# Patient Record
Sex: Female | Born: 1955 | Race: Black or African American | Hispanic: No | Marital: Single | Smoking: Never smoker
Health system: Southern US, Community
[De-identification: ages and names within clinical notes are randomized; demographics above are authoritative.]

## PROBLEM LIST (undated history)

## (undated) DIAGNOSIS — E079 Disorder of thyroid, unspecified: Secondary | ICD-10-CM

## (undated) DIAGNOSIS — M1990 Unspecified osteoarthritis, unspecified site: Secondary | ICD-10-CM

## (undated) DIAGNOSIS — M6282 Rhabdomyolysis: Secondary | ICD-10-CM

## (undated) DIAGNOSIS — D509 Iron deficiency anemia, unspecified: Secondary | ICD-10-CM

## (undated) DIAGNOSIS — G4730 Sleep apnea, unspecified: Secondary | ICD-10-CM

## (undated) HISTORY — PX: SHOULDER SURGERY: SHX246

## (undated) HISTORY — PX: DILATION AND CURETTAGE OF UTERUS: SHX78

## (undated) HISTORY — PX: GANGLION CYST EXCISION: SHX1691

## (undated) HISTORY — PX: ENDOMETRIAL ABLATION: SHX621

---

## 1999-05-11 ENCOUNTER — Other Ambulatory Visit: Admission: RE | Admit: 1999-05-11 | Discharge: 1999-05-11 | Payer: Self-pay | Admitting: Nephrology

## 1999-07-02 ENCOUNTER — Encounter: Payer: Self-pay | Admitting: Nephrology

## 1999-07-02 ENCOUNTER — Encounter: Admission: RE | Admit: 1999-07-02 | Discharge: 1999-07-02 | Payer: Self-pay | Admitting: Nephrology

## 2000-09-04 ENCOUNTER — Emergency Department (HOSPITAL_COMMUNITY): Admission: EM | Admit: 2000-09-04 | Discharge: 2000-09-04 | Payer: Self-pay | Admitting: *Deleted

## 2001-01-10 ENCOUNTER — Emergency Department (HOSPITAL_COMMUNITY): Admission: EM | Admit: 2001-01-10 | Discharge: 2001-01-10 | Payer: Self-pay | Admitting: Emergency Medicine

## 2001-01-10 ENCOUNTER — Encounter: Payer: Self-pay | Admitting: Emergency Medicine

## 2001-01-22 ENCOUNTER — Emergency Department (HOSPITAL_COMMUNITY): Admission: EM | Admit: 2001-01-22 | Discharge: 2001-01-23 | Payer: Self-pay | Admitting: Emergency Medicine

## 2001-07-12 ENCOUNTER — Emergency Department (HOSPITAL_COMMUNITY): Admission: EM | Admit: 2001-07-12 | Discharge: 2001-07-12 | Payer: Self-pay

## 2001-07-12 ENCOUNTER — Encounter: Payer: Self-pay | Admitting: Family Medicine

## 2002-01-27 ENCOUNTER — Emergency Department (HOSPITAL_COMMUNITY): Admission: EM | Admit: 2002-01-27 | Discharge: 2002-01-27 | Payer: Self-pay | Admitting: Emergency Medicine

## 2005-04-16 ENCOUNTER — Emergency Department (HOSPITAL_COMMUNITY): Admission: EM | Admit: 2005-04-16 | Discharge: 2005-04-16 | Payer: Self-pay | Admitting: Emergency Medicine

## 2005-06-10 ENCOUNTER — Emergency Department (HOSPITAL_COMMUNITY): Admission: EM | Admit: 2005-06-10 | Discharge: 2005-06-10 | Payer: Self-pay | Admitting: Family Medicine

## 2005-06-10 ENCOUNTER — Ambulatory Visit (HOSPITAL_COMMUNITY): Admission: RE | Admit: 2005-06-10 | Discharge: 2005-06-10 | Payer: Self-pay | Admitting: Family Medicine

## 2005-06-13 ENCOUNTER — Other Ambulatory Visit: Admission: RE | Admit: 2005-06-13 | Discharge: 2005-06-13 | Payer: Self-pay | Admitting: Obstetrics and Gynecology

## 2005-08-25 ENCOUNTER — Ambulatory Visit (HOSPITAL_COMMUNITY): Admission: RE | Admit: 2005-08-25 | Discharge: 2005-08-25 | Payer: Self-pay | Admitting: Obstetrics and Gynecology

## 2005-08-25 ENCOUNTER — Encounter (INDEPENDENT_AMBULATORY_CARE_PROVIDER_SITE_OTHER): Payer: Self-pay | Admitting: Specialist

## 2005-08-29 ENCOUNTER — Ambulatory Visit (HOSPITAL_COMMUNITY): Admission: RE | Admit: 2005-08-29 | Discharge: 2005-08-29 | Payer: Self-pay | Admitting: Obstetrics and Gynecology

## 2005-11-12 ENCOUNTER — Emergency Department (HOSPITAL_COMMUNITY): Admission: EM | Admit: 2005-11-12 | Discharge: 2005-11-12 | Payer: Self-pay | Admitting: Emergency Medicine

## 2006-01-10 ENCOUNTER — Emergency Department (HOSPITAL_COMMUNITY): Admission: EM | Admit: 2006-01-10 | Discharge: 2006-01-10 | Payer: Self-pay | Admitting: Family Medicine

## 2006-03-15 ENCOUNTER — Emergency Department (HOSPITAL_COMMUNITY): Admission: EM | Admit: 2006-03-15 | Discharge: 2006-03-15 | Payer: Self-pay | Admitting: Family Medicine

## 2006-03-21 ENCOUNTER — Ambulatory Visit: Payer: Self-pay | Admitting: Cardiovascular Disease

## 2006-06-19 ENCOUNTER — Other Ambulatory Visit: Admission: RE | Admit: 2006-06-19 | Discharge: 2006-06-19 | Payer: Self-pay | Admitting: Obstetrics and Gynecology

## 2007-02-11 ENCOUNTER — Emergency Department (HOSPITAL_COMMUNITY): Admission: EM | Admit: 2007-02-11 | Discharge: 2007-02-11 | Payer: Self-pay | Admitting: Family Medicine

## 2007-08-13 ENCOUNTER — Emergency Department (HOSPITAL_COMMUNITY): Admission: EM | Admit: 2007-08-13 | Discharge: 2007-08-13 | Payer: Self-pay | Admitting: Family Medicine

## 2008-02-07 ENCOUNTER — Ambulatory Visit (HOSPITAL_COMMUNITY): Admission: RE | Admit: 2008-02-07 | Discharge: 2008-02-07 | Payer: Self-pay | Admitting: Obstetrics and Gynecology

## 2008-11-17 ENCOUNTER — Emergency Department (HOSPITAL_COMMUNITY): Admission: EM | Admit: 2008-11-17 | Discharge: 2008-11-17 | Payer: Self-pay | Admitting: Family Medicine

## 2009-08-07 ENCOUNTER — Encounter: Admission: RE | Admit: 2009-08-07 | Discharge: 2009-08-07 | Payer: Self-pay | Admitting: Endocrinology

## 2010-08-15 ENCOUNTER — Encounter: Payer: Self-pay | Admitting: Obstetrics and Gynecology

## 2010-11-03 LAB — POCT URINALYSIS DIP (DEVICE)
Ketones, ur: NEGATIVE mg/dL
Nitrite: NEGATIVE
Protein, ur: NEGATIVE mg/dL

## 2010-11-03 LAB — D-DIMER, QUANTITATIVE: D-Dimer, Quant: 0.49 ug/mL-FEU — ABNORMAL HIGH (ref 0.00–0.48)

## 2010-12-10 NOTE — Op Note (Signed)
Theresa Kirby, Theresa Kirby               ACCOUNT NO.:  [id number]   MEDICAL RECORD NO.:  [id number]          PATIENT TYPE:  AMB   LOCATION:  SDC                           FACILITY:  [facility]   PHYSICIAN:  Osborn Coho, M.D.   DATE OF BIRTH:  [birth year]   DATE OF PROCEDURE:  08/25/2005  DATE OF DISCHARGE:                                 OPERATIVE REPORT   PREOPERATIVE DIAGNOSES:  1.  Menometrorrhagia.  2.  Cervical mass.   POSTOPERATIVE DIAGNOSES:  1.  Menometrorrhagia.  2.  Cervical mass.   PROCEDURES:  1.  Hysteroscopy.  2.  Dilation and curettage.  3.  Endometrial ablation.   ATTENDING:  Osborn Coho, M.D.   ANESTHESIA:  General via LMA.   SPECIMENS TO PATHOLOGY:  Frozen done on cervical mass with preliminary  results of benign endocervical polyp, endometrial curettings sent for  permanent.   FLUIDS:  1700 mL.   URINE OUTPUT:  Quantity sufficient via straight catheterization prior to  procedure.   ESTIMATED BLOOD LOSS:  Minimal.   HYSTEROSCOPIC FLUID DEFICIT:  Sorbitol 420 mL.   Lactated Ringer's 300 mL.   COMPLICATIONS:  None.   FINDINGS:  Left cornual approximately 1 cm mass/polyp and approximately 3 cm  endocervical mass.   PROCEDURE:  The patient was taken to the operating room after the risks,  benefits and alternatives reviewed with the patient, the patient verbalized  understanding and consent signed and witnessed.  The patient was placed  under general per anesthesia and prepped and draped in the normal sterile  fashion in the dorsal lithotomy position.  A bivalve speculum was placed in  the patient's vagina after the patient was straight-catheterized with  quantity sufficient.  A total of 10 mL of 1% lidocaine was used to  administer a paracervical block.  A single-tooth tenaculum was placed on the  anterior lip of the cervix and the cervix dilated for passage of the  diagnostic hysteroscope with findings as noted above.  The cervix was then  dilated for  passage of the resectoscope and resection performed of left  cornual mass and the stump of cervical mass as the cervical mass was removed  via polyp forceps with a twisting motion prior to diagnostic hysteroscopy.  Curettage was performed until a gritty texture was noted of the endometrial  cavity and the endocervical cavity.  After diagnostic hysteroscopy, the  uterus was sounded to 13 cm and the cervical length was obtained at 5 cm.  The NovaSure instrument was set at 6.5+ centimeters and after curettage and  reintroduction of the resectoscope with no obvious lesion remaining, the  NovaSure instrument was introduced and after placing gauze a couple of steps  of troubleshoot with changing the CO2 tank at the back, cavity assessment  was passed.  The ablation was performed for a total of approximately 48  seconds at a power 168 watts, and the cavity width was noted to be 4.7 cm.  All instruments were removed.  There was good hemostasis at the tenaculum  site.  Count was correct.  The patient tolerated the procedure well and  is  currently awaiting extubation per anesthesia and transfer to the recovery  room.      Osborn Coho, M.D.  Electronically Signed     AR/MEDQ  D:  08/25/2005  T:  08/25/2005  Job:  [id number]

## 2010-12-10 NOTE — Assessment & Plan Note (Signed)
[facility]                              CARDIOLOGY OFFICE NOTE   HARRISON, PAULSON                      MRN:          [id number]  DATE:03/21/2006                            DOB:          1955-12-01    PRIMARY CARE:  [facility].   IDENTIFYING INFORMATION:  Theresa Kirby is a 55 year old African-American woman  presenting for evaluation of shortness of breath and fatigue.   HISTORY OF PRESENT ILLNESS:  Theresa Kirby is a very pleasant 55 year old  African-American woman who has chronic exertional dyspnea, fatigue and  intermittent leg swelling. She was evaluated at the [facility] and found to have cardiomegaly on a chest x-ray. She was subsequently  referred for evaluation of her symptoms as well as abnormal finding on chest  x-ray.   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 and has noted at work she has  been very fatigued. She also complains of dyspnea with activities. Her  dyspnea is variable.  At times she has symptoms with minimal activity and at  other times she is asymptomatic. She does have three pillow orthopnea but  does not have PND.  She has generalized fatigue which has been present for  some time. She complains of a generalized heaviness that is associated  with her fatigue.  Her leg swelling tends to get worse over the course of  the day and is more pronounced with hot weather.   She denies chest pain or pressure. She has rare palpitations at rest. She  denies light headedness or syncope.   PAST MEDICAL HISTORY IS PERTINENT FOR THE FOLLOWING:  1. Asthma. She takes inhaled corticosteroids on a regular basis and uses      albuterol on an as needed basis.  2. Obesity.  3. Remote C-section, 1979.   FAMILY HISTORY:  Her mother has congestive heart failure initially diagnosed  in her 51's.  Her father is alive and well and she has a brother who is also  alive and well.   SOCIAL HISTORY:  The patient  is single. She has one child. 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 She is not in a regular  exercise program but she is active with household chores and work.   REVIEW OF SYSTEMS:  A complete 12 point review of systems was performed.  Pertinent positives include only asthma and constipation. All other systems  were reviewed and are negative except as described above.   MEDICATIONS:  1. Flovent b.i.d.  2. Iron 324 mg daily.  3. Albuterol p.r.n.   ALLERGIES:  SULFA (causes hives).   PHYSICAL EXAMINATION:  GENERAL:  The patient is alert and oriented, in no  acute distress. She is an obese African-American female.  VITAL SIGNS:  Her height is 5 feet, 4 inches. Weight is 332 pounds. Blood  pressure is 132/70 in the left arm, heart rate 63, respiratory rate 16.  HEENT EXAM:  Eyes - sclerae anicteric,  conjunctivae pink. Moist oral mucosa,  oropharynx is clear.  NECK:  There is no cervical lymphadenopathy, carotid upstrokes are 2+ and  equal without bruits, jugular venous pressure is normal.  LUNGS:  Clear to auscultation bilaterally.  CARDIOVASCULAR:  The heart has a regular rate and rhythm, there is no  gallop. There is a 2/6 systolic ejection murmur along the left sternal  border.  ABDOMEN:  Soft, obese, nontender, no organomegaly is appreciated. There are  no abdominal bruits.  EXTREMITIES:  There is 1+ pretibial edema bilaterally. Pulses are 2+ and  equal throughout the extremities. There is no clubbing or cyanosis.  SKIN:  Is warm and dry without rash.  NEUROLOGIC EXAM:  Is grossly intact with 5/5 motor strength in the arms and  legs bilaterally.   EKG demonstrates normal sinus rhythm and the tracing is normal.   Chest x-ray from March 15, 2006 showed low lung volumes with bibasilar  atelectasis, the heart is mildly enlarged and the study showed no definite  acute  findings.   LABORATORY DATA:  Demonstrated a creatinine of 1.2, glucose of 103,  potassium 4.0. Hemoglobin of 15. TSH of 3.867.   ASSESSMENT:  This is a 55 year old African-American woman with marked  obesity presenting with dyspnea and fatigue.  Her problem list is as  follows:  Dyspnea. This seems to be chronic in nature although there has been some  increase in symptoms recently. With the finding of cardiomegaly on her chest  x-ray I think it is reasonable to perform a transthoracic echocardiogram to  evaluate left ventricular size and function. I do not detect any valvular  disease on her examination. Certainly her dyspnea could also be related to  her underlying asthma or her obesity but a cardiac cause can be better  elicited after reviewing her echocardiogram.   She clearly would benefit from an exercise program and weight loss which we  also discussed today but will review with her further after her  echocardiogram results are available.   Thank you for the opportunity to evaluate Theresa Kirby.  Please feel free to  contact me at any time with questions regarding her care.                                 Micheline Chapman, MD    MDC/MedQ  DD:  03/21/2006  DT:  03/22/2006  Job #:  [id number]   cc:   [facility]

## 2011-02-15 ENCOUNTER — Other Ambulatory Visit: Payer: Self-pay | Admitting: Endocrinology

## 2011-02-15 DIAGNOSIS — E049 Nontoxic goiter, unspecified: Secondary | ICD-10-CM

## 2011-02-18 ENCOUNTER — Ambulatory Visit
Admission: RE | Admit: 2011-02-18 | Discharge: 2011-02-18 | Disposition: A | Discharge status: Home / Self Care | Payer: Federal, State, Local not specified - PPO | Source: Ambulatory Visit | Attending: Endocrinology | Admitting: Endocrinology

## 2011-02-18 DIAGNOSIS — E049 Nontoxic goiter, unspecified: Secondary | ICD-10-CM

## 2011-05-09 LAB — HEPATIC FUNCTION PANEL
ALT: <8
AST: 17
Albumin: 3.6
Alkaline Phosphatase: 37 — ABNORMAL LOW
Bilirubin, Direct: <0.1
Total Bilirubin: 0.5
Total Protein: 7.1

## 2011-05-09 LAB — CBC
HCT: 46
Hemoglobin: 14.6
MCHC: 31.7
MCV: 80.1
Platelets: 276
RBC: 5.74 — ABNORMAL HIGH
RDW: 18.2 — ABNORMAL HIGH
WBC: 7.2

## 2011-05-09 LAB — I-STAT 8, (EC8 V) (CONVERTED LAB)
Acid-Base Excess: 6 — ABNORMAL HIGH
BUN: 17
Bicarbonate: 32.6 — ABNORMAL HIGH
Chloride: 101
Glucose, Bld: 116 — ABNORMAL HIGH
HCT: 52 — ABNORMAL HIGH
Hemoglobin: 17.7 — ABNORMAL HIGH
Operator id: 235561
Potassium: 3.9
Sodium: 138
TCO2: 34
pCO2, Ven: 53.8 — ABNORMAL HIGH
pH, Ven: 7.39 — ABNORMAL HIGH

## 2011-05-09 LAB — DIFFERENTIAL
Basophils Absolute: 0
Basophils Relative: 0
Eosinophils Absolute: 0.1
Eosinophils Relative: 2
Lymphocytes Relative: 23
Lymphs Abs: 1.7
Monocytes Absolute: 0.5
Monocytes Relative: 7
Neutro Abs: 4.9
Neutrophils Relative %: 68

## 2011-05-09 LAB — POCT I-STAT CREATININE
Creatinine, Ser: 0.9
Operator id: 235561

## 2011-08-12 ENCOUNTER — Encounter (INDEPENDENT_AMBULATORY_CARE_PROVIDER_SITE_OTHER): Payer: Federal, State, Local not specified - PPO | Admitting: Ophthalmology

## 2011-08-12 DIAGNOSIS — E1139 Type 2 diabetes mellitus with other diabetic ophthalmic complication: Secondary | ICD-10-CM

## 2011-08-12 DIAGNOSIS — E11319 Type 2 diabetes mellitus with unspecified diabetic retinopathy without macular edema: Secondary | ICD-10-CM

## 2011-08-12 DIAGNOSIS — H43819 Vitreous degeneration, unspecified eye: Secondary | ICD-10-CM

## 2011-08-12 DIAGNOSIS — H2510 Age-related nuclear cataract, unspecified eye: Secondary | ICD-10-CM

## 2011-12-08 ENCOUNTER — Encounter (HOSPITAL_BASED_OUTPATIENT_CLINIC_OR_DEPARTMENT_OTHER)
Admission: RE | Admit: 2011-12-08 | Discharge: 2011-12-08 | Disposition: A | Discharge status: Home / Self Care | Source: Ambulatory Visit | Attending: Orthopedic Surgery | Admitting: Orthopedic Surgery

## 2011-12-08 ENCOUNTER — Encounter (HOSPITAL_BASED_OUTPATIENT_CLINIC_OR_DEPARTMENT_OTHER): Payer: Self-pay | Admitting: *Deleted

## 2011-12-08 LAB — BASIC METABOLIC PANEL
BUN: 17 mg/dL (ref 6–23)
Chloride: 100 mEq/L (ref 96–112)
Creatinine, Ser: 0.97 mg/dL (ref 0.50–1.10)
GFR calc Af Amer: 75 mL/min — ABNORMAL LOW (ref >90)
Glucose, Bld: 113 mg/dL — ABNORMAL HIGH (ref 70–99)
Potassium: 4 mEq/L (ref 3.5–5.1)

## 2011-12-08 NOTE — Progress Notes (Signed)
Pt to come in for bmet and ekg-to bring cpap-and overnight bag and all meds in case anesth needs her to stay post op due to OSA

## 2011-12-09 ENCOUNTER — Other Ambulatory Visit: Payer: Self-pay | Admitting: Orthopedic Surgery

## 2011-12-12 ENCOUNTER — Encounter (HOSPITAL_BASED_OUTPATIENT_CLINIC_OR_DEPARTMENT_OTHER): Payer: Self-pay | Admitting: Anesthesiology

## 2011-12-12 ENCOUNTER — Encounter (HOSPITAL_BASED_OUTPATIENT_CLINIC_OR_DEPARTMENT_OTHER)
Admission: RE | Disposition: A | Discharge status: Home / Self Care | Payer: Self-pay | Source: Ambulatory Visit | Attending: Orthopedic Surgery

## 2011-12-12 ENCOUNTER — Ambulatory Visit (HOSPITAL_BASED_OUTPATIENT_CLINIC_OR_DEPARTMENT_OTHER): Admitting: Anesthesiology

## 2011-12-12 ENCOUNTER — Ambulatory Visit (HOSPITAL_BASED_OUTPATIENT_CLINIC_OR_DEPARTMENT_OTHER)
Admission: RE | Admit: 2011-12-12 | Discharge: 2011-12-13 | Disposition: A | Discharge status: Home / Self Care | Source: Ambulatory Visit | Attending: Orthopedic Surgery | Admitting: Orthopedic Surgery

## 2011-12-12 ENCOUNTER — Encounter (HOSPITAL_BASED_OUTPATIENT_CLINIC_OR_DEPARTMENT_OTHER): Payer: Self-pay | Admitting: *Deleted

## 2011-12-12 DIAGNOSIS — M75100 Unspecified rotator cuff tear or rupture of unspecified shoulder, not specified as traumatic: Secondary | ICD-10-CM

## 2011-12-12 DIAGNOSIS — Z01810 Encounter for preprocedural cardiovascular examination: Secondary | ICD-10-CM | POA: Insufficient documentation

## 2011-12-12 DIAGNOSIS — M659 Unspecified synovitis and tenosynovitis, unspecified site: Secondary | ICD-10-CM | POA: Insufficient documentation

## 2011-12-12 DIAGNOSIS — M24119 Other articular cartilage disorders, unspecified shoulder: Secondary | ICD-10-CM | POA: Insufficient documentation

## 2011-12-12 DIAGNOSIS — Y9289 Other specified places as the place of occurrence of the external cause: Secondary | ICD-10-CM | POA: Insufficient documentation

## 2011-12-12 DIAGNOSIS — M249 Joint derangement, unspecified: Secondary | ICD-10-CM | POA: Insufficient documentation

## 2011-12-12 DIAGNOSIS — J45909 Unspecified asthma, uncomplicated: Secondary | ICD-10-CM | POA: Insufficient documentation

## 2011-12-12 DIAGNOSIS — G4730 Sleep apnea, unspecified: Secondary | ICD-10-CM | POA: Insufficient documentation

## 2011-12-12 DIAGNOSIS — E119 Type 2 diabetes mellitus without complications: Secondary | ICD-10-CM | POA: Insufficient documentation

## 2011-12-12 DIAGNOSIS — Y990 Civilian activity done for income or pay: Secondary | ICD-10-CM | POA: Insufficient documentation

## 2011-12-12 DIAGNOSIS — W19XXXA Unspecified fall, initial encounter: Secondary | ICD-10-CM | POA: Insufficient documentation

## 2011-12-12 DIAGNOSIS — S43429A Sprain of unspecified rotator cuff capsule, initial encounter: Secondary | ICD-10-CM | POA: Insufficient documentation

## 2011-12-12 HISTORY — DX: Sleep apnea, unspecified: G47.30

## 2011-12-12 LAB — GLUCOSE, CAPILLARY: Glucose-Capillary: 177 mg/dL — ABNORMAL HIGH (ref 70–99)

## 2011-12-12 SURGERY — SHOULDER ARTHROSCOPY WITH DISTAL CLAVICLE RESECTION
Anesthesia: General | Site: Shoulder | Laterality: Left | Wound class: Clean

## 2011-12-12 MED ORDER — DEXAMETHASONE SODIUM PHOSPHATE 4 MG/ML IJ SOLN
INTRAMUSCULAR | Status: DC | PRN
  Administered 2011-12-12: 4 mg via INTRAVENOUS

## 2011-12-12 MED ORDER — DROPERIDOL 2.5 MG/ML IJ SOLN
INTRAMUSCULAR | Status: DC | PRN
  Administered 2011-12-12: 0.625 mg via INTRAVENOUS

## 2011-12-12 MED ORDER — SODIUM CHLORIDE 0.9 % IR SOLN
Status: DC | PRN
  Administered 2011-12-12: 9000 mL

## 2011-12-12 MED ORDER — CEFAZOLIN SODIUM 1-5 GM-% IV SOLN
INTRAVENOUS | Status: DC | PRN
  Administered 2011-12-12: 2 g via INTRAVENOUS

## 2011-12-12 MED ORDER — OXYCODONE-ACETAMINOPHEN 5-325 MG PO TABS: 1.0000 | ORAL_TABLET | ORAL | Status: AC | PRN

## 2011-12-12 MED ORDER — ALBUTEROL SULFATE HFA 108 (90 BASE) MCG/ACT IN AERS: 2.0000 | INHALATION_SPRAY | Freq: Four times a day (QID) | RESPIRATORY_TRACT | Status: DC | PRN

## 2011-12-12 MED ORDER — LIDOCAINE HCL 4 % MT SOLN
OROMUCOSAL | Status: DC | PRN
  Administered 2011-12-12: 2 mL via TOPICAL

## 2011-12-12 MED ORDER — KCL IN DEXTROSE-NACL 20-5-0.45 MEQ/L-%-% IV SOLN
INTRAVENOUS | Status: DC
  Administered 2011-12-12: 16:00:00 via INTRAVENOUS

## 2011-12-12 MED ORDER — FENTANYL CITRATE 0.05 MG/ML IJ SOLN: 25.0000 ug | INTRAMUSCULAR | Status: DC | PRN

## 2011-12-12 MED ORDER — MORPHINE SULFATE 2 MG/ML IJ SOLN: 2.0000 mg | INTRAMUSCULAR | Status: DC | PRN

## 2011-12-12 MED ORDER — LACTATED RINGERS IV SOLN
INTRAVENOUS | Status: DC
  Administered 2011-12-12 (×2): via INTRAVENOUS

## 2011-12-12 MED ORDER — METOCLOPRAMIDE HCL 5 MG PO TABS: 5.0000 mg | ORAL_TABLET | Freq: Three times a day (TID) | ORAL | Status: DC | PRN

## 2011-12-12 MED ORDER — OXYCODONE-ACETAMINOPHEN 5-325 MG PO TABS
1.0000 | ORAL_TABLET | ORAL | Status: DC | PRN
  Administered 2011-12-13 (×2): 2 via ORAL

## 2011-12-12 MED ORDER — FLUTICASONE-SALMETEROL 250-50 MCG/DOSE IN AEPB
1.0000 | INHALATION_SPRAY | Freq: Two times a day (BID) | RESPIRATORY_TRACT | Status: DC
  Administered 2011-12-12: 1 via RESPIRATORY_TRACT

## 2011-12-12 MED ORDER — SUCCINYLCHOLINE CHLORIDE 20 MG/ML IJ SOLN
INTRAMUSCULAR | Status: DC | PRN
  Administered 2011-12-12: 160 mg via INTRAVENOUS

## 2011-12-12 MED ORDER — ONDANSETRON HCL 4 MG PO TABS: 4.0000 mg | ORAL_TABLET | Freq: Four times a day (QID) | ORAL | Status: DC | PRN

## 2011-12-12 MED ORDER — BUPIVACAINE HCL (PF) 0.25 % IJ SOLN
INTRAMUSCULAR | Status: DC | PRN
  Administered 2011-12-12: 18 mL

## 2011-12-12 MED ORDER — FENTANYL CITRATE 0.05 MG/ML IJ SOLN
INTRAMUSCULAR | Status: DC | PRN
  Administered 2011-12-12: 50 ug via INTRAVENOUS

## 2011-12-12 MED ORDER — LIDOCAINE HCL (CARDIAC) 20 MG/ML IV SOLN
INTRAVENOUS | Status: DC | PRN
  Administered 2011-12-12: 100 mg via INTRAVENOUS

## 2011-12-12 MED ORDER — FENTANYL CITRATE 0.05 MG/ML IJ SOLN
50.0000 ug | INTRAMUSCULAR | Status: DC | PRN
  Administered 2011-12-12: 100 ug via INTRAVENOUS

## 2011-12-12 MED ORDER — METOCLOPRAMIDE HCL 5 MG/ML IJ SOLN: 10.0000 mg | Freq: Once | INTRAMUSCULAR | Status: AC | PRN

## 2011-12-12 MED ORDER — PROPOFOL 10 MG/ML IV EMUL
INTRAVENOUS | Status: DC | PRN
  Administered 2011-12-12: 250 mg via INTRAVENOUS
  Administered 2011-12-12: 20 mg via INTRAVENOUS
  Administered 2011-12-12: 30 mg via INTRAVENOUS

## 2011-12-12 MED ORDER — METOCLOPRAMIDE HCL 5 MG/ML IJ SOLN: 5.0000 mg | Freq: Three times a day (TID) | INTRAMUSCULAR | Status: DC | PRN

## 2011-12-12 MED ORDER — ONDANSETRON HCL 4 MG/2ML IJ SOLN: 4.0000 mg | Freq: Four times a day (QID) | INTRAMUSCULAR | Status: DC | PRN

## 2011-12-12 MED ORDER — METFORMIN HCL ER 500 MG PO TB24
500.0000 mg | ORAL_TABLET | Freq: Every day | ORAL | Status: DC
  Administered 2011-12-13: 500 mg via ORAL

## 2011-12-12 MED ORDER — MIDAZOLAM HCL 2 MG/2ML IJ SOLN
0.5000 mg | INTRAMUSCULAR | Status: DC | PRN
  Administered 2011-12-12: 2 mg via INTRAVENOUS

## 2011-12-12 MED ORDER — ONDANSETRON HCL 4 MG/2ML IJ SOLN
INTRAMUSCULAR | Status: DC | PRN
  Administered 2011-12-12: 4 mg via INTRAVENOUS

## 2011-12-12 SURGICAL SUPPLY — 89 items
ADH SKN CLS APL DERMABOND .7 (GAUZE/BANDAGES/DRESSINGS)
APL SKNCLS STERI-STRIP NONHPOA (GAUZE/BANDAGES/DRESSINGS)
BENZOIN TINCTURE PRP APPL 2/3 (GAUZE/BANDAGES/DRESSINGS) IMPLANT
BLADE 4.2CUDA (BLADE) IMPLANT
BLADE CUDA 5.5 (BLADE) IMPLANT
BLADE CUTTER GATOR 3.5 (BLADE) IMPLANT
BLADE GREAT WHITE 4.2 (BLADE) IMPLANT
BLADE SURG 15 STRL LF DISP TIS (BLADE) IMPLANT
BLADE SURG 15 STRL SS (BLADE)
BLADE SURG ROTATE 9660 (MISCELLANEOUS) IMPLANT
BUR 3.5 LG SPHERICAL (BURR) IMPLANT
BUR OVAL 4.0 (BURR) ×3 IMPLANT
BUR OVAL 6.0 (BURR) IMPLANT
BURR 3.5 LG SPHERICAL (BURR)
CANISTER OMNI JUG 16 LITER (MISCELLANEOUS) ×3 IMPLANT
CANISTER SUCTION 2500CC (MISCELLANEOUS) IMPLANT
CANNULA 5.75X71 LONG (CANNULA) ×3 IMPLANT
CANNULA TWIST IN 8.25X7CM (CANNULA) IMPLANT
CHLORAPREP W/TINT 26ML (MISCELLANEOUS) ×3 IMPLANT
CLOTH BEACON ORANGE TIMEOUT ST (SAFETY) ×1 IMPLANT
DECANTER SPIKE VIAL GLASS SM (MISCELLANEOUS) IMPLANT
DERMABOND ADVANCED (GAUZE/BANDAGES/DRESSINGS)
DERMABOND ADVANCED .7 DNX12 (GAUZE/BANDAGES/DRESSINGS) IMPLANT
DRAPE INCISE IOBAN 66X45 STRL (DRAPES) ×3 IMPLANT
DRAPE STERI 35X30 U-POUCH (DRAPES) ×3 IMPLANT
DRAPE SURG 17X23 STRL (DRAPES) ×3 IMPLANT
DRAPE U 20/CS (DRAPES) ×3 IMPLANT
DRAPE U-SHAPE 47X51 STRL (DRAPES) ×3 IMPLANT
DRAPE U-SHAPE 76X120 STRL (DRAPES) ×6 IMPLANT
DRSG PAD ABDOMINAL 8X10 ST (GAUZE/BANDAGES/DRESSINGS) ×3 IMPLANT
ELECT REM PT RETURN 9FT ADLT (ELECTROSURGICAL)
ELECTRODE REM PT RTRN 9FT ADLT (ELECTROSURGICAL) ×1 IMPLANT
GAUZE SPONGE 4X4 16PLY XRAY LF (GAUZE/BANDAGES/DRESSINGS) IMPLANT
GAUZE XEROFORM 1X8 LF (GAUZE/BANDAGES/DRESSINGS) ×3 IMPLANT
GLOVE BIO SURGEON STRL SZ7.5 (GLOVE) ×7 IMPLANT
GLOVE BIOGEL PI IND STRL 7.0 (GLOVE) ×1 IMPLANT
GLOVE BIOGEL PI IND STRL 8 (GLOVE) ×5 IMPLANT
GLOVE BIOGEL PI INDICATOR 7.0 (GLOVE) ×1
GLOVE BIOGEL PI INDICATOR 8 (GLOVE) ×2
GLOVE ECLIPSE 6.5 STRL STRAW (GLOVE) ×2 IMPLANT
GOWN PREVENTION PLUS XLARGE (GOWN DISPOSABLE) ×5 IMPLANT
NDL 1/2 CIR CATGUT .05X1.09 (NEEDLE) IMPLANT
NDL SCORPION MULTI FIRE (NEEDLE) IMPLANT
NDL SUT 6 .5 CRC .975X.05 MAYO (NEEDLE) IMPLANT
NEEDLE 1/2 CIR CATGUT .05X1.09 (NEEDLE) IMPLANT
NEEDLE MAYO TAPER (NEEDLE)
NEEDLE SCORPION MULTI FIRE (NEEDLE) IMPLANT
NS IRRIG 1000ML POUR BTL (IV SOLUTION) IMPLANT
PACK ARTHROSCOPY DSU (CUSTOM PROCEDURE TRAY) ×3 IMPLANT
PACK BASIN DAY SURGERY FS (CUSTOM PROCEDURE TRAY) ×3 IMPLANT
PENCIL BUTTON HOLSTER BLD 10FT (ELECTRODE) IMPLANT
RESECTOR FULL RADIUS 4.2MM (BLADE) ×3 IMPLANT
RESECTOR FULL RADIUS 4.8MM (BLADE) IMPLANT
SHEET MEDIUM DRAPE 40X70 STRL (DRAPES) ×1 IMPLANT
SLEEVE SCD COMPRESS KNEE MED (MISCELLANEOUS) ×3 IMPLANT
SLING ARM FOAM STRAP LRG (SOFTGOODS) IMPLANT
SLING ARM FOAM STRAP MED (SOFTGOODS) IMPLANT
SLING ARM FOAM STRAP XLG (SOFTGOODS) IMPLANT
SLING ARM IMMOBILIZER MED (SOFTGOODS) IMPLANT
SPONGE GAUZE 4X4 12PLY (GAUZE/BANDAGES/DRESSINGS) ×3 IMPLANT
SPONGE LAP 4X18 X RAY DECT (DISPOSABLE) IMPLANT
STRIP CLOSURE SKIN 1/2X4 (GAUZE/BANDAGES/DRESSINGS) IMPLANT
SUCTION FRAZIER TIP 10 FR DISP (SUCTIONS) IMPLANT
SUPPORT WRAP ARM LG (MISCELLANEOUS) ×2 IMPLANT
SUT 2 FIBERLOOP 20 STRT BLUE (SUTURE)
SUT BONE WAX W31G (SUTURE) IMPLANT
SUT ETHIBOND 2 OS 4 DA (SUTURE) IMPLANT
SUT ETHILON 3 0 PS 1 (SUTURE) ×3 IMPLANT
SUT ETHILON 4 0 PS 2 18 (SUTURE) ×1 IMPLANT
SUT FIBERWIRE #2 38 T-5 BLUE (SUTURE)
SUT FIBERWIRE 2-0 18 17.9 3/8 (SUTURE)
SUT MNCRL AB 3-0 PS2 18 (SUTURE) IMPLANT
SUT MNCRL AB 4-0 PS2 18 (SUTURE) IMPLANT
SUT PDS AB 0 CT 36 (SUTURE) IMPLANT
SUT PROLENE 3 0 PS 2 (SUTURE) ×1 IMPLANT
SUT VIC AB 0 CT1 18XCR BRD 8 (SUTURE) IMPLANT
SUT VIC AB 0 CT1 8-18 (SUTURE)
SUT VIC AB 2-0 SH 18 (SUTURE) IMPLANT
SUTURE 2 FIBERLOOP 20 STRT BLU (SUTURE) IMPLANT
SUTURE FIBERWR #2 38 T-5 BLUE (SUTURE) IMPLANT
SUTURE FIBERWR 2-0 18 17.9 3/8 (SUTURE) IMPLANT
SYR BULB 3OZ (MISCELLANEOUS) IMPLANT
TOWEL OR 17X24 6PK STRL BLUE (TOWEL DISPOSABLE) ×3 IMPLANT
TOWEL OR NON WOVEN STRL DISP B (DISPOSABLE) ×3 IMPLANT
TUBE CONNECTING 20X1/4 (TUBING) ×3 IMPLANT
TUBING ARTHROSCOPY IRRIG 16FT (MISCELLANEOUS) ×3 IMPLANT
WAND STAR VAC 90 (SURGICAL WAND) ×3 IMPLANT
WATER STERILE IRR 1000ML POUR (IV SOLUTION) ×2 IMPLANT
YANKAUER SUCT BULB TIP NO VENT (SUCTIONS) IMPLANT

## 2011-12-12 NOTE — Anesthesia Preprocedure Evaluation (Addendum)
Anesthesia Evaluation  Patient identified by MRN, date of birth, ID band Patient awake    Reviewed: Allergy & Precautions, H&P , NPO status , Patient's Chart, lab work & pertinent test results, reviewed documented beta blocker date and time   Airway Mallampati: II TM Distance: >3 FB Neck ROM: full    Dental   Pulmonary asthma , sleep apnea ,          Cardiovascular negative cardio ROS      Neuro/Psych negative neurological ROS  negative psych ROS   GI/Hepatic negative GI ROS, Neg liver ROS,   Endo/Other  Diabetes mellitus-Morbid obesity  Renal/GU negative Renal ROS  negative genitourinary   Musculoskeletal   Abdominal   Peds  Hematology negative hematology ROS (+)   Anesthesia Other Findings See surgeon's H&P   Reproductive/Obstetrics negative OB ROS                           Anesthesia Physical Anesthesia Plan  ASA: III  Anesthesia Plan: General   Post-op Pain Management:    Induction: Intravenous  Airway Management Planned: Oral ETT  Additional Equipment:   Intra-op Plan:   Post-operative Plan: Extubation in OR  Informed Consent: I have reviewed the patients History and Physical, chart, labs and discussed the procedure including the risks, benefits and alternatives for the proposed anesthesia with the patient or authorized representative who has indicated his/her understanding and acceptance.   Dental Advisory Given  Plan Discussed with: CRNA and Surgeon  Anesthesia Plan Comments:         Anesthesia Quick Evaluation

## 2011-12-12 NOTE — Op Note (Addendum)
Procedure(s): SHOULDER ARTHROSCOPY WITH DISTAL CLAVICLE RESECTION Procedure Note  Theresa Kirby female 56 y.o. 12/12/2011  Procedure(s) and Anesthesia Type:    * Left SHOULDER ARTHROSCOPY WITH SUBACROMIAL DECOMPRESSION, DISTAL CLAVICLE RESECTION, intra-articular debridement of degenerative labral tearing and long head biceps tenotomy, debridement partial undersurface supraspinatus tear and upper border subscapularis tear. - General  Surgeon(s) and Role:    * Mable Paris, MD - Primary     Surgeon: Mable Paris   Assistants: None  Anesthesia: General endotracheal anesthesia and regional    Procedure Detail  SHOULDER ARTHROSCOPY WITH DISTAL CLAVICLE RESECTION  Estimated Blood Loss: Min         Drains: none  Blood Given: none         Specimens: none        Complications:  * No complications entered in OR log *         Disposition: PACU - hemodynamically stable.         Condition: stable    Procedure:   INDICATIONS FOR SURGERY: The patient is 56 y.o. female who fell at work 08/26/2011 injuring her left shoulder. She went on to have continued pain in MRI revealed a small rotator cuff tear anteriorly with some partial tearing of the upper border of the subscapularis and subluxation of the biceps tendon. She also had symptomatic a.c. joint changes and unfavorable anterior acromial anatomy. She was indicated for surgical treatment to examine and debridement versus fixed the rotator cuff tears, address the biceps tendon, and address for unfavorable bony anatomy. She understood risks benefits alternatives to the procedure and wished to go forward.  OPERATIVE FINDINGS: Examination under anesthesia: No stiffness or instability Diagnostic Arthroscopy:  Glenoid articular cartilage: She had some small central areas of grade 3 changes on the glenoid, otherwise cartilage was intact. Humeral head articular cartilage: Intact Labrum: She had degenerative flap  tears of the anterior labrum which did not extend into the superior labrum. She had tearing extending into the base of the biceps which involved a significant portion of the tendon and the biceps tendon was subluxated medially. The biceps was tenotomized. Loose bodies: None Synovitis: Moderate throughout the joint Articular sided rotator cuff: Fraying of the anterior leading edge but no full-thickness tearing.  this was debrided arthroscopically. Bursal sided rotator cuff: Intact Coracoacromial ligament: Significantly frayed. She had a very large and hooked underlying anterior acromial spur which was taken down the standard acromioplasty.  DESCRIPTION OF PROCEDURE: The patient was identified in preoperative  holding area where I personally marked the operative site after  verifying site, side, and procedure with the patient. An interscalene block was given by the attending anesthesiologist the holding area.  The patient was taken back to the operating room where general anesthesia was induced without complication and was placed in the beach-chair position with the back  elevated about 60 degrees and all extremities and head and neck carefully padded and  positioned.   The left upper extremity was then prepped and  draped in a standard sterile fashion. The appropriate time-out  procedure was carried out. The patient did receive IV antibiotics  within 30 minutes of incision.   A small posterior portal incision was made and the arthroscope was introduced into the joint. An anterior portal was then established above the subscapularis using needle localization. Small cannula was placed anteriorly. Diagnostic arthroscopy was then carried out with findings as described above.  The shaver was introduced through the anterior portal and used to debride  the extensive anterior labral tearing. Once it was debrided it was felt that the remaining labrum was intact and did not require any formal repair. The  biceps tendon was carefully probed and there was some extensive tearing of the base of the biceps which did not extend into the superior labrum. The biceps was also noted to be subluxated medially. Therefore a large biter was used through the anterior portal in the joint to perform a biceps tenotomy. The shaver was then used through the anterior portal to debride the partial-thickness undersurface anterior supraspinatus tear just posterior to the biceps. The upper border of the subscapularis was also found to have some fraying and partial thickness involvement but no full-thickness involvement and therefore was debrided. Remaining portion of the subscapularis was carefully probed and found to be completely intact. The remainder of the rotator cuff was examined and found to be completely intact from the articular surface. There was some synovitis in the rotator interval which was debrided with the ArthriCare.  The arthroscope was then introduced into the subacromial space a standard lateral portal was established with needle localization. The shaver was used through the lateral portal to perform extensive bursectomy. She was noted to have extensive hypertrophied bursa. Coracoacromial ligament was examined and found to be significantly frayed..  The bursal surface of the rotator cuff was carefully examined anterior and posterior and found to be completely intact. Given her x-ray findings which were concerning for possible calcific deposits in the posterior cuff I carefully probed the posterior cuff and then used a spinal needle percutaneously to try and find a calcific deposits. I did not find any.  The coracoacromial ligament was taken down off the anterior acromion with the ArthroCare exposing a large hooked anterior acromial spur. A high-speed bur was then used through the lateral portal to take down the anterior acromial spur from lateral to medial in a standard acromioplasty.  The acromioplasty was also  viewed from the lateral portal and the bur was used as necessary to ensure that the acromion was completely flat from posterior to anterior.  The distal clavicle was exposed arthroscopically and the bur was used to take off the undersurface for approximately 8 mm from the lateral portal. The distal clavicle was noted to be devoid of any cartilage. The bur was then moved to an anterior portal position to complete the distal clavicle excision resecting about 8 mm of the distal clavicle and a smooth even fashion. This was viewed from anterior and lateral portals and felt to be complete.  The arthroscopic equipment was removed from the joint and the portals were closed with 3-0 nylon in an interrupted fashion. Sterile dressings were then applied including Xeroform 4 x 4's ABDs and tape. The patient was then allowed to awaken from general anesthesia, placed in a sling, transferred to the stretcher and taken to the recovery room in stable condition.   POSTOPERATIVE PLAN: The patient will be discharged home today and will followup in one week for suture removal and wound check.  She will begin gentle passive range of motion exercises as soon as she is comfortable and we will get her into physical therapy when she returns for her postoperative visit in one week. She will be observed carefully in the recovery room. If there is any concern about anesthesia and her sleep apnea we will keep her overnight for observation. She is very compliant with her CPAP and it may be possible that she could be discharged home today if  she is cleared by the anesthesiologist.

## 2011-12-12 NOTE — Progress Notes (Signed)
Assisted Dr. Frederick with left, ultrasound guided, interscalene  block. Side rails up, monitors on throughout procedure. See vital signs in flow sheet. Tolerated Procedure well. 

## 2011-12-12 NOTE — Progress Notes (Addendum)
Pt is calming down -- still complains to help her-- having a hard time breathing. Removed blow by and finally able to get BP on pt since admission

## 2011-12-12 NOTE — H&P (Signed)
DONALEE GAUMOND is an 56 y.o. female.   Chief Complaint: Left shoulder pain HPI: 56 year old female who had an injury at work to the left shoulder. She went on to have pain and dysfunction. An MRI revealed small rotator cuff tear. She was indicated for operative treatment.    Past Medical History  Diagnosis Date  . Sleep apnea     uses cpap  . Diabetes mellitus   . Asthma     Past Surgical History  Procedure Date  . Cesarean section   . Dilation and curettage of uterus   . Endometrial ablation   . Ganglion cyst excision     rt arm    No family history on file. Social History:  reports that she has never smoked. She does not have any smokeless tobacco history on file. She reports that she does not drink alcohol or use illicit drugs.  Allergies:  Allergies  Allergen Reactions  . Sulfa Antibiotics Hives    Medications Prior to Admission  Medication Sig Dispense Refill  . acetaminophen (TYLENOL) 325 MG tablet Take 650 mg by mouth every 6 (six) hours as needed.      . Fluticasone-Salmeterol (ADVAIR) 250-50 MCG/DOSE AEPB Inhale 1 puff into the lungs every 12 (twelve) hours.      . metformin (FORTAMET) 500 MG (OSM) 24 hr tablet Take 500 mg by mouth at bedtime.      . Multiple Vitamin (MULTIVITAMIN) capsule Take 1 capsule by mouth daily.      Marland Kitchen albuterol (PROVENTIL HFA;VENTOLIN HFA) 108 (90 BASE) MCG/ACT inhaler Inhale 2 puffs into the lungs every 6 (six) hours as needed.        Results for orders placed during the hospital encounter of 12/12/11 (from the past 48 hour(s))  POCT HEMOGLOBIN-HEMACUE     Status: Normal   Collection Time   12/12/11 11:58 AM      Component Value Range Comment   Hemoglobin 13.4  12.0 - 15.0 (g/dL)    No results found.  Review of Systems  All other systems reviewed and are negative.    Blood pressure 134/77, pulse 59, temperature 98 F (36.7 C), temperature source Oral, resp. rate 20, height 5\' 4"  (1.626 m), weight 134.718 kg (297 lb), SpO2  100.00%. Physical Exam  Constitutional: She is oriented to person, place, and time. She appears well-developed and well-nourished.  HENT:  Head: Atraumatic.  Eyes: EOM are normal.  Cardiovascular: Intact distal pulses.   Respiratory: Effort normal.  Musculoskeletal:       Left shoulder: She exhibits tenderness and pain.  Neurological: [name] and oriented to person, place, and time.  Skin: Skin is warm and dry.  Psychiatric: She has a normal mood and affect.     Assessment/Plan Left shoulder small rotator cuff tear, impingement, symptomatic a.c. joint arthrosis Plan for rotator cuff repair versus debridement, subacromial decompression, distal clavicle excision. Risks / benefits of surgery discussed Consent on chart  NPO for OR Preop antibiotics   Javier Mamone WILLIAM 12/12/2011, 12:14 PM

## 2011-12-12 NOTE — Transfer of Care (Signed)
Immediate Anesthesia Transfer of Care Note  Patient: [name]  Procedure(s) Performed: Procedure(s) (LRB): SHOULDER ARTHROSCOPY WITH DISTAL CLAVICLE RESECTION (Left)  Patient Location: PACU  Anesthesia Type: GA combined with regional for post-op pain  Level of Consciousness: awake and pateint uncooperative  Airway & Oxygen Therapy: Patient Spontanous Breathing and Patient connected to face mask oxygen  Post-op Assessment: Report given to PACU RN, Post -op Vital signs reviewed and stable and Patient moving all extremities  Post vital signs: Reviewed and stable  Complications: No apparent anesthesia complications

## 2011-12-12 NOTE — Anesthesia Postprocedure Evaluation (Signed)
  Anesthesia Post-op Note  Patient: Theresa Kirby  Procedure(s) Performed: Procedure(s) (LRB): SHOULDER ARTHROSCOPY WITH DISTAL CLAVICLE RESECTION (Left)  Patient Location: PACU  Anesthesia Type: GA combined with regional for post-op pain  Level of Consciousness: awake, alert  and oriented  Airway and Oxygen Therapy: Patient Spontanous Breathing and Patient connected to nasal cannula oxygen  Post-op Pain: none  Post-op Assessment: Post-op Vital signs reviewed  Post-op Vital Signs: Reviewed  Complications: No apparent anesthesia complications

## 2011-12-12 NOTE — Anesthesia Procedure Notes (Addendum)
Anesthesia Regional Block:  Interscalene brachial plexus block  Pre-Anesthetic Checklist: ,, timeout performed, Correct Patient, Correct Site, Correct Laterality, Correct Procedure, Correct Position, site marked, Risks and benefits discussed,  Surgical consent,  Pre-op evaluation,  At surgeon's request and post-op pain management  Laterality: Left  Prep: chloraprep       Needles:   Needle Type: Other   (Arrow Echogenic)   Needle Length: 9cm  Needle Gauge: 21    Additional Needles:  Procedures: ultrasound guided Interscalene brachial plexus block Narrative:  Start time: 12/12/2011 12:12 PM End time: 12/12/2011 12:20 PM Injection made incrementally with aspirations every 5 mL.  Performed by: Personally  Anesthesiologist: Aldona Lento, MD  Additional Notes: Ultrasound guidance used to: id relevant anatomy, confirm needle position, local anesthetic spread, avoidance of vascular puncture. Picture saved. No complications. Block performed personally by Janetta Hora. Gelene Mink, MD    Interscalene brachial plexus block Procedure Name: Intubation Date/Time: 12/12/2011 12:42 PM Performed by: Meyer Russel Pre-anesthesia Checklist: Patient identified, Emergency Drugs available, Suction available and Patient being monitored Patient Re-evaluated:Patient Re-evaluated prior to inductionOxygen Delivery Method: Circle System Utilized Preoxygenation: Pre-oxygenation with 100% oxygen Intubation Type: IV induction Ventilation: Mask ventilation without difficulty Laryngoscope Size: Miller and 2 Grade View: Grade II Tube type: Oral Tube size: 7.0 mm Number of attempts: 1 Airway Equipment and Method: LTA kit utilized and stylet Placement Confirmation: ETT inserted through vocal cords under direct vision,  positive ETCO2 and breath sounds checked- equal and bilateral Secured at: 23 cm Tube secured with: Tape Dental Injury: Teeth and Oropharynx as per pre-operative assessment

## 2011-12-12 NOTE — Progress Notes (Addendum)
On admission to PACU pt moving around in bed, trying to pull off leads and Simple mask. Pt very restless lying down with HOB up slightly - pt rolling from side to side and trying to get out of bed. Jerolyn Center RN , Suann Larry CRNA , Pervis Hocking RN at bedside trying to keep pt in bed.  14:31pm Dr. Shelby Mattocks called to bedside and Noah Delaine Rn at bedside assisting to help keep pt in bed and keep her from pulling off the monitors and her O2. Dr. Gelene Mink ordered  [place] started at @2  liters/min and give Blow by with 10 liters.- sats reading 91-92%. 14:35pm Increased [place] to 3 liters/min with Blow by and sats reading 97- 98%- Pt states she can not get her breath-- still at times pulling leads off , wanting [place] off of her face.

## 2011-12-13 ENCOUNTER — Emergency Department (HOSPITAL_COMMUNITY)

## 2011-12-13 ENCOUNTER — Encounter (HOSPITAL_COMMUNITY): Payer: Self-pay | Admitting: *Deleted

## 2011-12-13 ENCOUNTER — Emergency Department (HOSPITAL_COMMUNITY)
Admission: EM | Admit: 2011-12-13 | Discharge: 2011-12-13 | Disposition: A | Discharge status: Home / Self Care | Attending: Emergency Medicine | Admitting: Emergency Medicine

## 2011-12-13 DIAGNOSIS — E119 Type 2 diabetes mellitus without complications: Secondary | ICD-10-CM | POA: Insufficient documentation

## 2011-12-13 DIAGNOSIS — J9819 Other pulmonary collapse: Secondary | ICD-10-CM | POA: Insufficient documentation

## 2011-12-13 DIAGNOSIS — R0602 Shortness of breath: Secondary | ICD-10-CM | POA: Diagnosis present

## 2011-12-13 DIAGNOSIS — G4730 Sleep apnea, unspecified: Secondary | ICD-10-CM | POA: Diagnosis not present

## 2011-12-13 DIAGNOSIS — Z9889 Other specified postprocedural states: Secondary | ICD-10-CM | POA: Diagnosis not present

## 2011-12-13 DIAGNOSIS — M25519 Pain in unspecified shoulder: Secondary | ICD-10-CM | POA: Diagnosis not present

## 2011-12-13 DIAGNOSIS — J45909 Unspecified asthma, uncomplicated: Secondary | ICD-10-CM | POA: Diagnosis not present

## 2011-12-13 LAB — DIFFERENTIAL
Basophils Absolute: 0 10*3/uL (ref 0.0–0.1)
Eosinophils Relative: 1 % (ref 0–5)
Lymphocytes Relative: 22 % (ref 12–46)
Lymphs Abs: 1.7 10*3/uL (ref 0.7–4.0)
Monocytes Absolute: 0.6 10*3/uL (ref 0.1–1.0)
Neutro Abs: 5.4 10*3/uL (ref 1.7–7.7)

## 2011-12-13 LAB — URINALYSIS, ROUTINE W REFLEX MICROSCOPIC
Bilirubin Urine: NEGATIVE
Ketones, ur: NEGATIVE mg/dL
Nitrite: NEGATIVE
Protein, ur: NEGATIVE mg/dL
Specific Gravity, Urine: 1.022 (ref 1.005–1.030)
Urobilinogen, UA: 0.2 mg/dL (ref 0.0–1.0)

## 2011-12-13 LAB — CBC
HCT: 38.1 % (ref 36.0–46.0)
MCV: 83.6 fL (ref 78.0–100.0)
RBC: 4.56 MIL/uL (ref 3.87–5.11)
RDW: 15.4 % (ref 11.5–15.5)
WBC: 7.7 10*3/uL (ref 4.0–10.5)

## 2011-12-13 LAB — PRO B NATRIURETIC PEPTIDE: Pro B Natriuretic peptide (BNP): 122.8 pg/mL (ref 0–125)

## 2011-12-13 LAB — BASIC METABOLIC PANEL
BUN: 14 mg/dL (ref 6–23)
Calcium: 8.8 mg/dL (ref 8.4–10.5)
Chloride: 102 mEq/L (ref 96–112)
GFR calc Af Amer: 80 mL/min — ABNORMAL LOW (ref >90)
GFR calc non Af Amer: 69 mL/min — ABNORMAL LOW (ref >90)
Sodium: 140 mEq/L (ref 135–145)

## 2011-12-13 MED ORDER — ALBUTEROL SULFATE (5 MG/ML) 0.5% IN NEBU
5.0000 mg | INHALATION_SOLUTION | Freq: Once | RESPIRATORY_TRACT | Status: AC
Start: 2011-12-13 — End: 2011-12-13
  Administered 2011-12-13: 5 mg via RESPIRATORY_TRACT
  Filled 2011-12-13: qty 1

## 2011-12-13 MED ORDER — OXYCODONE-ACETAMINOPHEN 5-325 MG PO TABS
2.0000 | ORAL_TABLET | Freq: Once | ORAL | Status: AC
  Administered 2011-12-13: 2 via ORAL
  Filled 2011-12-13: qty 2

## 2011-12-13 NOTE — Discharge Instructions (Signed)
Ms Lucy chest x-ray shows areas with better not getting enough oxygen because your not taking deep breaths. Use the incentive spur on that her every hour while you're awake. There is no pneumonia or infection in your lungs. Your labs were also showed no infection. Your EKG was also normal. Continue to use your inhalers and BiPAP and oxygen at home. Return to the ER for severe shortness of breath or chest pain. Followup with your PCP this week.   Shortness of Breath Shortness of breath (dyspnea) is the feeling of uneasy breathing. Shortness of breath needs care right away. HOME CARE   Do not smoke.   Avoid being around chemicals that may bother your breathing (such as paint fumes or dust).   Rest as needed. Slowly begin your usual activities.   Only take medicine as told by your doctor. Inhaled medicines or oxygen might be part of your treatment.   Follow up with your doctor as told. Waiting to do so or failure to follow up could result in worsening your condition and possible disability or death.   Understand what to do or who to call if your shortness of breath gets worse.  GET HELP RIGHT AWAY IF:   You get pain in your chest, shoulders, belly (abdomen), or jaw.   You cannot stop coughing or you start wheezing.   You cough up blood or thick mucus.   You can only speak with short words.   You have a fever.   You feel your heart racing or skipping beats.   You are not breathing better when you stop and rest.   Your condition does not improve in the time expected.   You have problems with medicines.  MAKE SURE YOU:  Understand these instructions.   Will watch your condition.   Will get help right away if you are not doing well or get worse.  Document Released: 12/28/2007 Document Revised: 06/30/2011 Document Reviewed: 05/27/2009 [facility] Patient Information 2012 [place], [place].

## 2011-12-13 NOTE — ED Notes (Signed)
Pt in from home by ems. Had day surgery rotator cuff surgery yesterday, kept overnight due to complications from anesthesia. Had nerve block performed at L shoulder. Pt c/o sob, feels like she "can't get deep enough breath." Sts she was kept for this reason last night. 2L O2 99%. VSS.

## 2011-12-13 NOTE — ED Notes (Signed)
WUJ:[id number]<JY> Expected date:12/13/11<BR> Expected time:<BR> Means of arrival:<BR> Comments:<BR> EMS 32 Ptar - sob

## 2011-12-13 NOTE — ED Notes (Signed)
Patient transported to X-ray 

## 2011-12-13 NOTE — Progress Notes (Signed)
PATIENT ID: MELANNI BENWAY  MRN: [id number]  DOB/AGE:  55-Apr-1957 / 55 y.o.  1 Day Post-Op Procedure(s) (LRB): SHOULDER ARTHROSCOPY WITH DISTAL CLAVICLE RESECTION (Left)  Subjective: Pain is mild.  No c/o chest pain or SOB.   Ready to go home   Objective: Vital signs in last 24 hours: Temp:  [98 F (36.7 C)-98.9 F (37.2 C)] 98.9 F (37.2 C) (05/20 2300) Pulse Rate:  [59-119] 59  (05/21 0600) Resp:  [18-31] 18  (05/21 0600) BP: (131-191)/(50-95) 131/50 mmHg (05/20 2300) SpO2:  [94 %-100 %] 95 % (05/21 0600)  Intake/Output from previous day: 05/20 0701 - 05/21 0700 In: 3302 [P.O.:1302; I.V.:2000] Out: 1175 [Urine:1175] Intake/Output this shift: Total I/O In: 1050 [P.O.:1050] Out: 975 [Urine:975]   Basename 12/12/11 1158  HGB 13.4   No results found for this basename: WBC:2,RBC:2,HCT:2,PLT:2 in the last 72 hours No results found for this basename: NA:2,K:2,CL:2,CO2:2,BUN:2,CREATININE:2,GLUCOSE:2,CALCIUM:2 in the last 72 hours No results found for this basename: LABPT:2,INR:2 in the last 72 hours  Physical Exam: Neurologically intact Intact pulses distally Incision: dressing C/D/I  Assessment/Plan: 1 Day Post-Op Procedure(s) (LRB): SHOULDER ARTHROSCOPY WITH DISTAL CLAVICLE RESECTION (Left)   D/c home today with family F/u 1 wk   Amandeep Hogston WILLIAM 12/13/2011, 6:57 AM

## 2011-12-13 NOTE — ED Provider Notes (Signed)
History     CSN: [id number]  Arrival date & time 12/13/11  1154   First MD Initiated Contact with Patient 12/13/11 1205      Chief Complaint  Patient presents with  . Shortness of Breath    (Consider location/radiation/quality/duration/timing/severity/associated sxs/prior treatment) Patient is a 56 y.o. female presenting with shortness of breath. The history is provided by the patient and a relative. No language interpreter was used.  Shortness of Breath  The current episode started yesterday. The onset was gradual. The problem has been gradually improving. The problem is moderate. Associated symptoms include shortness of breath. Pertinent negatives include no chest pain, no chest pressure, no fever, no sore throat, no cough and no wheezing. She was not exposed to toxic fumes. She has not inhaled smoke recently.   56 year old female coming in with complaint of shortness of breath. Patient had rotator cuff surgery was a spinal block the candidate to 15 yesterday. Patient doesn't stay overnight because she was feeling short of breath. When she went home this morning she took her Advair and became increasingly short of breath and proceeded to wear her nasal O2 without relief. She then tried her CPAP with no relief. Her daughter then called EMS to bring her to the hospital. Daughter states that she was kept in the postanesthesia department longer because of low O2 sats. She took Percocet at 8:30 this morning. She has a sling on her left shoulder and ice on her left shoulder postop rotator cuff repair with Dr. Ave Filter.  States she feels better now. pmh sleep apnea, diabetes, asthma, CPAP at night. Denies chest pain.    Past Medical History  Diagnosis Date  . Sleep apnea     uses cpap  . Diabetes mellitus   . Asthma     Past Surgical History  Procedure Date  . Cesarean section   . Dilation and curettage of uterus   . Endometrial ablation   . Ganglion cyst excision     rt arm    No  family history on file.  History  Substance Use Topics  . Smoking status: Never Smoker   . Smokeless tobacco: Not on file  . Alcohol Use: No    OB History    Grav Para Term Preterm Abortions TAB SAB Ect Mult Living                  Review of Systems  Constitutional: Negative for fever.       Morbid obese  HENT: Negative for sore throat.   Respiratory: Positive for shortness of breath. Negative for cough and wheezing.   Cardiovascular: Negative for chest pain.  Gastrointestinal: Negative for nausea and vomiting.  Musculoskeletal:       L shoulder pain  All other systems reviewed and are negative.    Allergies  Sulfa antibiotics  Home Medications   Current Outpatient Rx  Name Route Sig Dispense Refill  . ACETAMINOPHEN 325 MG PO TABS Oral Take 650 mg by mouth every 6 (six) hours as needed.     Marland Kitchen FLUTICASONE-SALMETEROL 250-50 MCG/DOSE IN AEPB Inhalation Inhale 1 puff into the lungs every 12 (twelve) hours.    Marland Kitchen METFORMIN HCL ER (OSM) 500 MG PO TB24 Oral Take 500 mg by mouth at bedtime.    . MULTIVITAMINS PO CAPS Oral Take 1 capsule by mouth daily.    . OXYCODONE-ACETAMINOPHEN 5-325 MG PO TABS Oral Take 1-2 tablets by mouth every 4 (four) hours as needed for pain. 60  tablet 0    BP 125/60  Pulse 62  Temp(Src) 98.7 F (37.1 C) (Oral)  Resp 20  SpO2 98%  Physical Exam  Nursing note and vitals reviewed. Constitutional: She is oriented to person, place, and time. She appears well-developed and well-nourished. No distress.  HENT:  Head: Normocephalic and atraumatic.  Eyes: Conjunctivae and EOM are normal. Pupils are equal, round, and reactive to light.  Neck: Normal range of motion. Neck supple.  Cardiovascular: Normal rate.   Pulmonary/Chest: Effort normal and breath sounds normal. No respiratory distress. She has no wheezes. She has no rales.  Abdominal: Soft.  Musculoskeletal: She exhibits no edema and no tenderness.       L shoulder tenderness (rotator cuff  surgery yest)dressing in tact  Neurological: [name] and oriented to person, place, and time. She has normal reflexes.  Skin: Skin is warm and dry.  Psychiatric: She has a normal mood and affect.    ED Course  Procedures (including critical care time)  Labs Reviewed - No data to display No results found.   No diagnosis found.    MDM  Rotator cuff surgery yesterday with block that effected her breathing.  Went home today and became SOB.  Afebrile. On cpap and home o2.  )2 sats 98 on 2Lnc. Chest x-ray shows bibasilar atelectasis.  IS in the ER and sent home with.  Better after breathing tmt.  EKG and other labs unremarkable.  VSS. Ready for discharge with her daughter.  Return if worse.        Remi Haggard, NP 12/14/11 1258

## 2011-12-13 NOTE — Discharge Instructions (Signed)
Discharge Instructions after Arthroscopic Shoulder Surgery   A sling has been provided for you. You may remove the sling after 72 hours. The sling may be worn for your protection, if you are in a crowd.  Use ice on the shoulder intermittently over the first 48 hours after surgery.  Pain medication has been prescribed for you.  Use your medication liberally over the first 48 hours, and then begin to taper your use. You may take Extra Strength Tylenol or Tylenol only in place of the pain pills. DO NOT take ANY nonsteroidal anti-inflammatory pain medications: Advil, Motrin, Ibuprofen, Aleve, Naproxen, or Naprosyn.  You may remove your dressing after two days.  You may shower 5 days after surgery. The incision CANNOT get wet prior to 5 days. Simply allow the water to wash over the site and then pat dry. Do not rub the incision. Make sure your axilla (armpit) is completely dry after showering.  Take one aspirin a day for 2 weeks after surgery, unless you have an aspirin sensitivity/allergy or asthma.  Three to 5 times each day you should perform assisted overhead reaching and external rotation (outward turning) exercises with the operative arm. Both exercises should be done with the non-operative arm used as the "therapist arm" while the operative arm remains relaxed. Ten of each exercise should be done three to five times each day.    Overhead reach is helping to lift your stiff arm up as high as it will go. To stretch your overhead reach, lie flat on your back, relax, and grasp the wrist of the tight shoulder with your opposite hand. Using the power in your opposite arm, bring the stiff arm up as far as it is comfortable. Start holding it for ten seconds and then work up to where you can hold it for a count of 30. Breathe slowly and deeply while the arm is moved. Repeat this stretch ten times, trying to help the arm up a little higher each time.       External rotation is turning the arm out to  the side while your elbow stays close to your body. External rotation is best stretched while you are lying on your back. Hold a cane, yardstick, broom handle, or dowel in both hands. Bend both elbows to a right angle. Use steady, gentle force from your normal arm to rotate the hand of the stiff shoulder out away from your body. Continue the rotation as far as it will go comfortably, holding it there for a count of 10. Repeat this exercise ten times.     Please call [phone] during normal business hours or [phone] after hours for any problems. Including the following:  - excessive redness of the incisions - drainage for more than 4 days - fever of more than 101.5 F  *Please note that pain medications will not be refilled after hours or on weekends.   Post Anesthesia Home Care Instructions  Activity: Get plenty of rest for the remainder of the day. A responsible adult should stay with you for 24 hours following the procedure.  For the next 24 hours, DO NOT: -Drive a car -Advertising copywriter -Drink alcoholic beverages -Take any medication unless instructed by your physician -Make any legal decisions or sign important papers.  Meals: Start with liquid foods such as gelatin or soup. Progress to regular foods as tolerated. Avoid greasy, spicy, heavy foods. If nausea and/or vomiting occur, drink only clear liquids until the nausea and/or vomiting subsides. Call your  physician if vomiting continues.  Special Instructions/Symptoms: Your throat may feel dry or sore from the anesthesia or the breathing tube placed in your throat during surgery. If this causes discomfort, gargle with warm salt water. The discomfort should disappear within 24 hours.   Post Anesthesia Home Care Instructions  Activity: Get plenty of rest for the remainder of the day. A responsible adult should stay with you for 24 hours following the procedure.  For the next 24 hours, DO NOT: -Drive a car -Social worker -Drink alcoholic beverages -Take any medication unless instructed by your physician -Make any legal decisions or sign important papers.  Meals: Start with liquid foods such as gelatin or soup. Progress to regular foods as tolerated. Avoid greasy, spicy, heavy foods. If nausea and/or vomiting occur, drink only clear liquids until the nausea and/or vomiting subsides. Call your physician if vomiting continues.  Special Instructions/Symptoms: Your throat may feel dry or sore from the anesthesia or the breathing tube placed in your throat during surgery. If this causes discomfort, gargle with warm salt water. The discomfort should disappear within 24 hours.

## 2011-12-16 NOTE — ED Provider Notes (Signed)
Medical screening examination/treatment/procedure(s) were performed by non-physician practitioner and as supervising physician I was immediately available for consultation/collaboration.   Lyanne Co, MD 12/16/11 [phone]

## 2012-02-08 ENCOUNTER — Encounter: Payer: Self-pay | Admitting: Obstetrics and Gynecology

## 2012-02-08 ENCOUNTER — Ambulatory Visit (INDEPENDENT_AMBULATORY_CARE_PROVIDER_SITE_OTHER): Payer: Federal, State, Local not specified - PPO | Admitting: Obstetrics and Gynecology

## 2012-02-08 VITALS — BP 128/80 | Resp 16 | Ht 64.0 in | Wt 300.0 lb

## 2012-02-08 DIAGNOSIS — N926 Irregular menstruation, unspecified: Secondary | ICD-10-CM

## 2012-02-08 DIAGNOSIS — F411 Generalized anxiety disorder: Secondary | ICD-10-CM

## 2012-02-08 DIAGNOSIS — F419 Anxiety disorder, unspecified: Secondary | ICD-10-CM | POA: Insufficient documentation

## 2012-02-08 DIAGNOSIS — R109 Unspecified abdominal pain: Secondary | ICD-10-CM

## 2012-02-08 DIAGNOSIS — E669 Obesity, unspecified: Secondary | ICD-10-CM

## 2012-02-08 NOTE — Progress Notes (Signed)
HISTORY OF PRESENT ILLNESS  [name] is a 56 y.o. year old female,No obstetric history on file., who presents for a problem visit. The patient has had an endometrial ablation.  She complains of irregular uterine bleeding.  She has been started on BuSpar for anxiety.  She has multiple stresses in her life.  Subjective:  The patient is not sexually active.  She has had GI upset and date abdominal pain.  Objective:  BP 128/80  Resp 16  Ht 5\' 4"  (1.626 m)  Wt 300 lb (136.079 kg)  BMI 51.49 kg/m2   General: alert, cooperative and mild distress Resp: clear to auscultation bilaterally Cardio: regular rate and rhythm, S1, S2 normal, no murmur, click, rub or gallop GI: soft, non-tender; bowel sounds normal; no masses,  no organomegaly  External genitalia: normal general appearance Vaginal: normal without tenderness, induration or masses and relaxation noted Adnexa: normal bimanual exam Uterus: difficult to outline because of obesity abdomen  Assessment:  Postmenopausal/irregular uterine bleeding Anxiety Abdominal pain Status post endometrial ablation Stress obesity  Plan:  Hydrosonogram Stress, anxiety, and menopause discussed. 25 min. Visit with 60% being face-to-face.  Return to office in 2 week(s).   Leonard Schwartz M.D.  02/08/2012 1:48 PM

## 2012-03-11 ENCOUNTER — Ambulatory Visit (INDEPENDENT_AMBULATORY_CARE_PROVIDER_SITE_OTHER): Payer: Federal, State, Local not specified - PPO | Admitting: Physician Assistant

## 2012-03-11 VITALS — BP 161/79 | HR 55 | Temp 97.7°F | Resp 16 | Ht 63.5 in | Wt 288.4 lb

## 2012-03-11 DIAGNOSIS — N390 Urinary tract infection, site not specified: Secondary | ICD-10-CM

## 2012-03-11 DIAGNOSIS — R350 Frequency of micturition: Secondary | ICD-10-CM

## 2012-03-11 DIAGNOSIS — IMO0001 Reserved for inherently not codable concepts without codable children: Secondary | ICD-10-CM

## 2012-03-11 DIAGNOSIS — R7989 Other specified abnormal findings of blood chemistry: Secondary | ICD-10-CM

## 2012-03-11 LAB — POCT UA - MICROSCOPIC ONLY
Casts, Ur, LPF, POC: NEGATIVE
Crystals, Ur, HPF, POC: NEGATIVE

## 2012-03-11 LAB — POCT CBC
Hemoglobin: 12.7 g/dL (ref 12.2–16.2)
MCHC: 30.2 g/dL — AB (ref 31.8–35.4)
MID (cbc): 0.4 (ref 0–0.9)
MPV: 8.7 fL (ref 0–99.8)
POC Granulocyte: 3.4 (ref 2–6.9)
POC MID %: 6.5 %M (ref 0–12)
Platelet Count, POC: 286 10*3/uL (ref 142–424)
RBC: 4.88 M/uL (ref 4.04–5.48)

## 2012-03-11 LAB — POCT URINALYSIS DIPSTICK
Protein, UA: NEGATIVE
Spec Grav, UA: <=1.005
Urobilinogen, UA: 0.2

## 2012-03-11 MED ORDER — ONDANSETRON HCL 8 MG PO TABS: ORAL_TABLET | ORAL | Status: DC

## 2012-03-11 MED ORDER — CIPROFLOXACIN HCL 500 MG PO TABS: 500.0000 mg | ORAL_TABLET | Freq: Two times a day (BID) | ORAL | Status: AC

## 2012-03-11 NOTE — Progress Notes (Signed)
Subjective:    Patient ID: Theresa Kirby, female    DOB: [birth year], 56 y.o.   MRN: [id number]  HPI 56 yr old CF presents with 1 1/2-2 week h/o urinary pressure. Over the past 3 days she has developed nausea and aching in abdomen.  Also c/o R flank/lower back pain that started  When she bent over to pick something up.  Tylenol is helping. No recent travel.  No changes in BMs. Patient saw her PCP ~1 month ago and they took her off of metformin bc her sugars had normalized.  Her BP is usu normal at her doctor's office. No f/c. No vaginal d/c.  Review of Systems  All other systems reviewed and are negative.       Objective:   Physical Exam  Nursing note and vitals reviewed. Constitutional: She is oriented to person, place, and time. She appears well-developed and well-nourished.  HENT:  Head: Normocephalic and atraumatic.  Mouth/Throat: Oropharynx is clear and moist.  Cardiovascular: Normal rate and regular rhythm.   Murmur (1-2/6 murmur at LSB) heard. Pulmonary/Chest: Effort normal and breath sounds normal.  Abdominal: Soft. Bowel sounds are normal. She exhibits no distension and no mass. There is no tenderness. There is no rebound and no guarding.  Neurological: [name] and oriented to person, place, and time.  Skin: Skin is warm and dry.     Results for orders placed in visit on 03/11/12  POCT UA - MICROSCOPIC ONLY      Component Value Range   WBC, Ur, HPF, POC 0-5     RBC, urine, microscopic 2-4     Bacteria, U Microscopic trace     Mucus, UA trace     Epithelial cells, urine per micros 2-4     Crystals, Ur, HPF, POC neg     Casts, Ur, LPF, POC neg     Yeast, UA neg    POCT URINALYSIS DIPSTICK      Component Value Range   Color, UA yellow     Clarity, UA cloudy     Glucose, UA neg     Bilirubin, UA neg     Ketones, UA neg     Spec Grav, UA <=1.005     Blood, UA large     pH, UA 7.0     Protein, UA neg     Urobilinogen, UA 0.2     Nitrite, UA neg     Leukocytes, UA small (1+)    POCT CBC      Component Value Range   WBC 5.7  4.6 - 10.2 K/uL   Lymph, poc 1.9  0.6 - 3.4   POC LYMPH PERCENT 33.9  10 - 50 %L   MID (cbc) 0.4  0 - 0.9   POC MID % 6.5  0 - 12 %M   POC Granulocyte 3.4  2 - 6.9   Granulocyte percent 59.6  37 - 80 %G   RBC 4.88  4.04 - 5.48 M/uL   Hemoglobin 12.7  12.2 - 16.2 g/dL   HCT, POC 40.9  81.1 - 47.9 %   MCV 86.2  80 - 97 fL   MCH, POC 26.0 (*) 27 - 31.2 pg   MCHC 30.2 (*) 31.8 - 35.4 g/dL   RDW, POC 91.4     Platelet Count, POC 286  142 - 424 K/uL   MPV 8.7  0 - 99.8 fL   BP recheck 140/72     Assessment & Plan:  UTI-cipro Nausea without vomiting-zofran Urine for culture Increased BP without diagnosis of htn-BP recheck was better than at triage, but still elevated.  She will recheck this when well out of the office.

## 2012-03-12 LAB — URINE CULTURE: Colony Count: >=100000

## 2012-03-13 MED ORDER — AMOXICILLIN 500 MG PO CAPS: 500.0000 mg | ORAL_CAPSULE | Freq: Three times a day (TID) | ORAL | Status: AC

## 2012-03-13 NOTE — Addendum Note (Signed)
Addended by: Anders Simmonds on: 03/13/2012 04:42 PM   Modules accepted: Orders

## 2012-03-13 NOTE — Progress Notes (Signed)
  Subjective:    Patient ID: Theresa Kirby, female    DOB: 30-Jun-1956, 56 y.o.   MRN: [id number]  HPI    Review of Systems     Objective:   Physical Exam        Assessment & Plan:

## 2012-03-15 ENCOUNTER — Ambulatory Visit (INDEPENDENT_AMBULATORY_CARE_PROVIDER_SITE_OTHER): Payer: Federal, State, Local not specified - PPO

## 2012-03-15 ENCOUNTER — Other Ambulatory Visit: Payer: Self-pay | Admitting: Obstetrics and Gynecology

## 2012-03-15 ENCOUNTER — Encounter: Payer: Self-pay | Admitting: Obstetrics and Gynecology

## 2012-03-15 ENCOUNTER — Ambulatory Visit (INDEPENDENT_AMBULATORY_CARE_PROVIDER_SITE_OTHER): Payer: Federal, State, Local not specified - PPO | Admitting: Obstetrics and Gynecology

## 2012-03-15 VITALS — BP 140/78 | Wt 284.0 lb

## 2012-03-15 DIAGNOSIS — N926 Irregular menstruation, unspecified: Secondary | ICD-10-CM

## 2012-03-15 DIAGNOSIS — N950 Postmenopausal bleeding: Secondary | ICD-10-CM

## 2012-03-15 DIAGNOSIS — N882 Stricture and stenosis of cervix uteri: Secondary | ICD-10-CM

## 2012-03-15 NOTE — Patient Instructions (Signed)
Hysteroscopy Hysteroscopy is a procedure used for looking inside the womb (uterus). It may be done for many different reasons, including:  To evaluate abnormal bleeding, fibroid (benign, noncancerous) tumors, polyps, scar tissue (adhesions), and possibly cancer of the uterus.   To look for lumps (tumors) and other uterine growths.   To look for causes of why a woman cannot get pregnant (infertility), causes of recurrent loss of pregnancy (miscarriages), or a lost intrauterine device (IUD).   To perform a sterilization by blocking the fallopian tubes from inside the uterus.  A hysteroscopy should be done right after a menstrual period to be sure you are not pregnant. LET YOUR CAREGIVER KNOW ABOUT:   Allergies.   Medicines taken, including herbs, eyedrops, over-the-counter medicines, and creams.   Use of steroids (by mouth or creams).   Previous problems with anesthetics or numbing medicines.   History of bleeding or blood problems.   History of blood clots.   Possibility of pregnancy, if this applies.   Previous surgery.   Other health problems.  RISKS AND COMPLICATIONS   Putting a hole in the uterus.   Excessive bleeding.   Infection.   Damage to the cervix.   Injury to other organs.   Allergic reaction to medicines.   Too much fluid used in the uterus for the procedure.  BEFORE THE PROCEDURE   Do not take aspirin or blood thinners for a week before the procedure, or as directed. It can cause bleeding.   Arrive at least 60 minutes before the procedure or as directed to read and sign the necessary forms.   Arrange for someone to take you home after the procedure.   If you smoke, do not smoke for 2 weeks before the procedure.  PROCEDURE   Your caregiver may give you medicine to relax you. He or she may also give you a medicine that numbs the area around the cervix (local anesthetic) or a medicine that makes you sleep (general anesthesia).   Sometimes, a  medicine is placed in the cervix the day before the procedure. This medicine makes the cervix have a larger opening (dilate). This makes it easier for the instrument to be inserted into the uterus.   A small instrument (hysteroscope) is inserted through the vagina into the uterus. This instrument is similar to a pencil-sized telescope with a light.   During the procedure, air or a liquid is put into the uterus, which allows the surgeon to see better.   Sometimes, tissue is gently scraped from inside the uterus. These tissue samples are sent to a specialist who looks at tissue samples (pathologist). The pathologist will give a report to your caregiver. This will help your caregiver decide if further treatment is necessary. The report will also help your caregiver decide on the best treatment if the test comes back abnormal.  AFTER THE PROCEDURE   If you had a general anesthetic, you may be groggy for a couple hours after the procedure.   If you had a local anesthetic, you will be advised to rest at the surgical center or caregiver's office until you are stable and feel ready to go home.   You may have some cramping for a couple days.   You may have bleeding, which varies from light spotting for a few days to menstrual-like bleeding for up to 3 to 7 days. This is normal.   Have someone take you home.  FINDING OUT THE RESULTS OF YOUR TEST Not all test results   are available during your visit. If your test results are not back during the visit, make an appointment with your caregiver to find out the results. Do not assume everything is normal if you have not heard from your caregiver or the medical facility. It is important for you to follow up on all of your test results. HOME CARE INSTRUCTIONS   Do not drive for 24 hours or as instructed.   Only take over-the-counter or prescription medicines for pain, discomfort, or fever as directed by your caregiver.   Do not take aspirin. It can cause or  aggravate bleeding.   Do not drive or drink alcohol while taking pain medicine.   You may resume your usual diet.   Do not use tampons, douche, or have sexual intercourse for 2 weeks, or as advised by your caregiver.   Rest and sleep for the first 24 to 48 hours.   Take your temperature twice a day for 4 to 5 days. Write it down. Give these temperatures to your caregiver if they are abnormal (above 98.6 F or 37.0 C).   Take medicines your caregiver has ordered as directed.   Follow your caregiver's advice regarding diet, exercise, lifting, driving, and general activities.   Take showers instead of baths for 2 weeks, or as recommended by your caregiver.   If you develop constipation:   Take a mild laxative with the advice of your caregiver.   Eat bran foods.   Drink enough water and fluids to keep your urine clear or pale yellow.   Try to have someone with you or available to you for the first 24 to 48 hours, especially if you had a general anesthetic.   Make sure you and your family understand everything about your operation and recovery.   Follow your caregiver's advice regarding follow-up appointments and Pap smears.  SEEK MEDICAL CARE IF:   You feel dizzy or lightheaded.   You feel sick to your stomach (nauseous).   You develop abnormal vaginal discharge.   You develop a rash.   You have an abnormal reaction or allergy to your medicine.   You need stronger pain medicine.  SEEK IMMEDIATE MEDICAL CARE IF:   Bleeding is heavier than a normal menstrual period or you have blood clots.   You have an oral temperature above 102 F (38.9 C), not controlled by medicine.   You have increasing cramps or pains not relieved with medicine.   You develop belly (abdominal) pain that does not seem to be related to the same area of earlier cramping and pain.   You pass out.   You develop pain in the tops of your shoulders (shoulder strap areas).   You develop shortness of  breath.  MAKE SURE YOU:   Understand these instructions.   Will watch your condition.   Will get help right away if you are not doing well or get worse.  Document Released: 10/17/2000 Document Revised: 06/30/2011 Document Reviewed: 02/09/2009 [employer] Patient Information 2012 [employer]. 

## 2012-03-15 NOTE — Progress Notes (Addendum)
HISTORY OF PRESENT ILLNESS  [name] is a 56 y.o. year old female,No obstetric history on file., who presents for a problem visit. She presents with irregular uterine bleeding.  She has had an endometrial ablation.  Subjective:  See above.  Objective:  BP 140/78  Wt 284 lb (128.822 kg)   General: alert GI: soft and nontender  External genitalia: normal general appearance Vaginal: normal without tenderness, induration or masses and relaxation noted Cervix: normal appearance Adnexa: normal bimanual exam Uterus: upper limits normal size  Pregnancy test is negative.  Procedure:  An ultrasound was performed which showed a 13.19 x 10.18 cm uterus.  Multiple fibroids were present.  The endometrium measured 6.42 mm.  The ovaries appeared normal.  Speculum exam was performed.  The cervix and vagina were prepped with Betadine.  Hurricaine gel was placed on the cervix.  A tenaculum was placed on the cervix.  Attempts were made to perform an endometrial biopsy.  We were unable to open the cervix.  An os finder was used to try to open the cervix.  We were not successful.  We attempted to place a catheter so that we could perform a hydro-sonogram.  This was unsuccessful.  Several attempts were made but the patient was much too uncomfortable.  The decision was to abort the procedure.  Assessment:  Postmenopausal bleeding Stenotic cervix Obesity  Plan:  Management options were reviewed.  Risk and benefits were discussed.  The patient elects to have an office hysteroscopy.  The risks and benefits were outlined.  We will schedule.  Return to office in 4 week(s).   Leonard Schwartz M.D.  03/15/2012 7:27 PM

## 2012-03-22 ENCOUNTER — Other Ambulatory Visit: Payer: Self-pay | Admitting: Obstetrics and Gynecology

## 2012-03-22 DIAGNOSIS — N926 Irregular menstruation, unspecified: Secondary | ICD-10-CM

## 2012-03-30 ENCOUNTER — Telehealth: Payer: Self-pay | Admitting: Obstetrics and Gynecology

## 2012-04-13 ENCOUNTER — Telehealth: Payer: Self-pay | Admitting: Obstetrics and Gynecology

## 2012-04-13 NOTE — Telephone Encounter (Signed)
Pt returning your call rgd surgery

## 2012-04-17 ENCOUNTER — Other Ambulatory Visit: Payer: Self-pay

## 2012-04-17 MED ORDER — IBUPROFEN 800 MG PO TABS: ORAL_TABLET | ORAL | Status: DC

## 2012-04-17 MED ORDER — HYDROCODONE-ACETAMINOPHEN 5-500 MG PO TABS: ORAL_TABLET | ORAL | Status: DC

## 2012-04-17 MED ORDER — PROMETHAZINE HCL 25 MG PO TABS: ORAL_TABLET | ORAL | Status: DC

## 2012-04-17 MED ORDER — DIAZEPAM 10 MG PO TABS: ORAL_TABLET | ORAL | Status: DC

## 2012-04-17 MED ORDER — MISOPROSTOL 200 MCG PO TABS: ORAL_TABLET | ORAL | Status: DC

## 2012-04-20 ENCOUNTER — Telehealth: Payer: Self-pay | Admitting: Obstetrics and Gynecology

## 2012-04-20 ENCOUNTER — Encounter: Payer: Self-pay | Admitting: Obstetrics and Gynecology

## 2012-04-20 NOTE — Telephone Encounter (Signed)
Office Hysteroscopy and Biopsy scheduled for 05/24/12 @ 9:30 with AVS. [employer] federal will pay 85% after a $350 deductible. Ref# [id number].  Pre-op due $70.00  -Adrianne Pridgen

## 2012-05-24 ENCOUNTER — Ambulatory Visit (INDEPENDENT_AMBULATORY_CARE_PROVIDER_SITE_OTHER): Payer: Federal, State, Local not specified - PPO | Admitting: Obstetrics and Gynecology

## 2012-05-24 VITALS — BP 138/76 | HR 78 | Temp 98.1°F | Wt 271.0 lb

## 2012-05-24 DIAGNOSIS — N840 Polyp of corpus uteri: Secondary | ICD-10-CM

## 2012-05-24 DIAGNOSIS — D219 Benign neoplasm of connective and other soft tissue, unspecified: Secondary | ICD-10-CM

## 2012-05-24 DIAGNOSIS — D259 Leiomyoma of uterus, unspecified: Secondary | ICD-10-CM

## 2012-05-24 DIAGNOSIS — N926 Irregular menstruation, unspecified: Secondary | ICD-10-CM

## 2012-05-24 DIAGNOSIS — N882 Stricture and stenosis of cervix uteri: Secondary | ICD-10-CM

## 2012-05-24 LAB — POCT URINE PREGNANCY: Preg Test, Ur: NEGATIVE

## 2012-05-24 MED ORDER — KETOROLAC TROMETHAMINE 60 MG/2ML IM SOLN
60.0000 mg | Freq: Once | INTRAMUSCULAR | Status: AC
  Administered 2012-05-24: 60 mg via INTRAMUSCULAR

## 2012-05-24 NOTE — Progress Notes (Signed)
Admission History and Physical Exam for a Gynecology Patient  [name] is a 56 y.o. female, No obstetric history on file., who presents for office hysteroscopy. She has been followed at the [facility] and Gynecology division of [facility]. See history below. She has fibroids, irregular bleeding, and a stenotic cervix.  OB History    Grav Para Term Preterm Abortions TAB SAB Ect Mult Living                  Past Medical History  Diagnosis Date  . Sleep apnea     uses cpap  . Diabetes mellitus   . Asthma      (Not in a hospital admission)  Past Surgical History  Procedure Date  . Cesarean section   . Dilation and curettage of uterus   . Endometrial ablation   . Ganglion cyst excision     rt arm    Allergies  Allergen Reactions  . Sulfa Antibiotics Hives    Family History: family history is not on file.  Social History:  reports that she has never smoked. She has never used smokeless tobacco. She reports that she does not drink alcohol or use illicit drugs.  Review of systems: Normal pregnancy complaints.  Admission Physical Exam:    There is no height or weight on file to calculate BMI.  There were no vitals taken for this visit.  HEENT:                 Within normal limits Chest:                   Clear Heart:                    Regular rate and rhythm Breasts:                No masses, skin changes, bleeding, or discharge present Abdomen:             Nontender, no masses Extremities:          Grossly normal Neurologic exam: Grossly normal  Pelvic exam:  External genitalia: normal general appearance Vaginal: normal without tenderness, induration or masses Cervix: normal appearance Adnexa: non palpable Uterus: irregular enlargement  Assessment:  Fibroids Irregular bleeding Stenotic cervix  Plan:  Office hysteroscopy.  Risk and benefits discussed.   Theresa Kirby V 05/24/2012, 9:16 AM

## 2012-05-24 NOTE — Patient Instructions (Signed)
Hysteroscopy  Hysteroscopy is a procedure used for looking inside the womb (uterus). It may be done for many different reasons, including:  · To evaluate abnormal bleeding, fibroid (benign, noncancerous) tumors, polyps, scar tissue (adhesions), and possibly cancer of the uterus.  · To look for lumps (tumors) and other uterine growths.  · To look for causes of why a woman cannot get pregnant (infertility), causes of recurrent loss of pregnancy (miscarriages), or a lost intrauterine device (IUD).  · To perform a sterilization by blocking the fallopian tubes from inside the uterus.  A hysteroscopy should be done right after a menstrual period to be sure you are not pregnant.  LET YOUR CAREGIVER KNOW ABOUT:   · Allergies.  · Medicines taken, including herbs, eyedrops, over-the-counter medicines, and creams.  · Use of steroids (by mouth or creams).  · Previous problems with anesthetics or numbing medicines.  · History of bleeding or blood problems.  · History of blood clots.  · Possibility of pregnancy, if this applies.  · Previous surgery.  · Other health problems.  RISKS AND COMPLICATIONS   · Putting a hole in the uterus.  · Excessive bleeding.  · Infection.  · Damage to the cervix.  · Injury to other organs.  · Allergic reaction to medicines.  · Too much fluid used in the uterus for the procedure.  BEFORE THE PROCEDURE   · Do not take aspirin or blood thinners for a week before the procedure, or as directed. It can cause bleeding.  · Arrive at least 60 minutes before the procedure or as directed to read and sign the necessary forms.  · Arrange for someone to take you home after the procedure.  · If you smoke, do not smoke for 2 weeks before the procedure.  PROCEDURE   · Your caregiver may give you medicine to relax you. He or she may also give you a medicine that numbs the area around the cervix (local anesthetic) or a medicine that makes you sleep (general anesthesia).  · Sometimes, a medicine is placed in the cervix  the day before the procedure. This medicine makes the cervix have a larger opening (dilate). This makes it easier for the instrument to be inserted into the uterus.  · A small instrument (hysteroscope) is inserted through the vagina into the uterus. This instrument is similar to a pencil-sized telescope with a light.  · During the procedure, air or a liquid is put into the uterus, which allows the surgeon to see better.  · Sometimes, tissue is gently scraped from inside the uterus. These tissue samples are sent to a specialist who looks at tissue samples (pathologist). The pathologist will give a report to your caregiver. This will help your caregiver decide if further treatment is necessary. The report will also help your caregiver decide on the best treatment if the test comes back abnormal.  AFTER THE PROCEDURE   · If you had a general anesthetic, you may be groggy for a couple hours after the procedure.  · If you had a local anesthetic, you will be advised to rest at the surgical center or caregiver's office until you are stable and feel ready to go home.  · You may have some cramping for a couple days.  · You may have bleeding, which varies from light spotting for a few days to menstrual-like bleeding for up to 3 to 7 days. This is normal.  · Have someone take you home.  FINDING OUT THE   RESULTS OF YOUR TEST  Not all test results are available during your visit. If your test results are not back during the visit, make an appointment with your caregiver to find out the results. Do not assume everything is normal if you have not heard from your caregiver or the medical facility. It is important for you to follow up on all of your test results.  HOME CARE INSTRUCTIONS   · Do not drive for 24 hours or as instructed.  · Only take over-the-counter or prescription medicines for pain, discomfort, or fever as directed by your caregiver.  · Do not take aspirin. It can cause or aggravate bleeding.  · Do not drive or drink  alcohol while taking pain medicine.  · You may resume your usual diet.  · Do not use tampons, douche, or have sexual intercourse for 2 weeks, or as advised by your caregiver.  · Rest and sleep for the first 24 to 48 hours.  · Take your temperature twice a day for 4 to 5 days. Write it down. Give these temperatures to your caregiver if they are abnormal (above 98.6° F or 37.0° C).  · Take medicines your caregiver has ordered as directed.  · Follow your caregiver's advice regarding diet, exercise, lifting, driving, and general activities.  · Take showers instead of baths for 2 weeks, or as recommended by your caregiver.  · If you develop constipation:  · Take a mild laxative with the advice of your caregiver.  · Eat bran foods.  · Drink enough water and fluids to keep your urine clear or pale yellow.  · Try to have someone with you or available to you for the first 24 to 48 hours, especially if you had a general anesthetic.  · Make sure you and your family understand everything about your operation and recovery.  · Follow your caregiver's advice regarding follow-up appointments and Pap smears.  SEEK MEDICAL CARE IF:   · You feel dizzy or lightheaded.  · You feel sick to your stomach (nauseous).  · You develop abnormal vaginal discharge.  · You develop a rash.  · You have an abnormal reaction or allergy to your medicine.  · You need stronger pain medicine.  SEEK IMMEDIATE MEDICAL CARE IF:   · Bleeding is heavier than a normal menstrual period or you have blood clots.  · You have an oral temperature above 102° F (38.9° C), not controlled by medicine.  · You have increasing cramps or pains not relieved with medicine.  · You develop belly (abdominal) pain that does not seem to be related to the same area of earlier cramping and pain.  · You pass out.  · You develop pain in the tops of your shoulders (shoulder strap areas).  · You develop shortness of breath.  MAKE SURE YOU:   · Understand these instructions.  · Will watch  your condition.  · Will get help right away if you are not doing well or get worse.  Document Released: 10/17/2000 Document Revised: 10/03/2011 Document Reviewed: 02/09/2009  ExitCare® Patient Information ©2013 [employer].

## 2012-05-24 NOTE — Progress Notes (Signed)
OPERATIVE NOTE  SIBLEY ROLISON  DOB:    [birth year]  MRN:    [id number]  CSN:    [id number]  Date of Surgery:  05/24/2012  Preoperative Diagnosis:  Fibroids Irregular bleeding Stenotic cervix  Postoperative Diagnosis:  same  Procedure:  Office Hysteroscopy Dilatation and curettage  Surgeon:  Leonard Schwartz, M.D.  Assistant:  None  Anesthetic:  Narcotics by mouth and paracervical block  Disposition:  The patient is a 56 y.o.-year-old female who presents with fibroids, irregular bleeding, and a stenotic cervix. She understands the indications for her surgical procedure. She accepts the risk of, but not limited to, anesthetic complications, bleeding, infections, and possible damage to the surrounding organs. The patient and I believe that the benefits of the procedure support this operation in an office setting.  Findings:  On examination under anesthesia the uterus was 14 weeks size. No adnexal masses were appreciated. No parametrial disease was appreciated. The uterus sounded to 9 cm. The patient was noted to have a thickened endometrium and submucosal fibroids.  Procedure:  The patient was taken to the procedure room. The perineum and vagina were prepped with Betadine.  A paracervical block was placed using 10 cc of half percent Marcaine, and 15 cc of 1% lidocaine with epinephrine. An endocervical curettage was performed. The cervix was gently dilated. The hysteroscope was inserted and the cavity was carefully inspected. Pictures were taken. See findings above.the cavity was explored with Garen Grams forceps. The cavity was then curetted using a sharp curet. The cavity was felt to be clean at the end of our procedure. Hemostasis was adequate. All instruments were removed. The examination was repeated and the uterus was noted to be firm. Sponge, and needle counts were correct. The estimated blood loss for the procedure was 5 cc. The estimated fluid deficit  is  less than 200 cc. She was returned to the supine position and and transported to the recovery  in stable condition. The endocervical curettings, and endometrial curettings were sent to pathology.  Followup instructions:  The patient will return to see Dr. Stefano Gaul in 2 weeks. She was given a copy of the postoperative instructions for patients who've undergone hysteroscopy.  Discharge medications:  Motrin 800 mg every 8 hours as needed for mild to moderate pain. Extra strength Vicodin.  Leonard Schwartz, M.D.

## 2012-05-24 NOTE — Progress Notes (Signed)
Second set of vitals @ 10:10am  B/P: 128/74  Resp:14  Pulse:82   Third set of Vitals @ 11:00am   B/P: 118/68  Pulse: 80  Resp: 14

## 2012-05-28 LAB — PATHOLOGY

## 2012-06-05 ENCOUNTER — Encounter: Payer: Federal, State, Local not specified - PPO | Admitting: Obstetrics and Gynecology

## 2012-06-14 ENCOUNTER — Ambulatory Visit (INDEPENDENT_AMBULATORY_CARE_PROVIDER_SITE_OTHER): Payer: Federal, State, Local not specified - PPO | Admitting: Obstetrics and Gynecology

## 2012-06-14 ENCOUNTER — Encounter: Payer: Self-pay | Admitting: Obstetrics and Gynecology

## 2012-06-14 VITALS — BP 124/70 | Ht 64.0 in | Wt 273.0 lb

## 2012-06-14 DIAGNOSIS — N926 Irregular menstruation, unspecified: Secondary | ICD-10-CM

## 2012-06-15 NOTE — Progress Notes (Signed)
HISTORY OF PRESENT ILLNESS  [name] is a 56 y.o. year old female,No obstetric history on file., who presents for a problem visit. On May 24, 2012 the patient had an office hysteroscopy with curettage.  Her path report returned showing benign elements.  Subjective:  The patient reports that she is doing well.  She denies bleeding and cramping at this point.  Objective:  BP 124/70  Ht 5\' 4"  (1.626 m)  Wt 273 lb (123.832 kg)  BMI 46.86 kg/m2  LMP 05/12/2012   General: no distress GI: soft and nontender  External genitalia: normal general appearance Vaginal: atrophic mucosa and relaxation noted Cervix: normal appearance Adnexa: normal bimanual exam Uterus: nontender, normal size as best I can tell (exam limited by obesity abdomen)  Assessment:  Irregular bleeding.  Status post hysteroscopy and D&C with benign pathology report  Plan:  Return for routine annual exam  Return to office prn if symptoms worsen or fail to improve.   Leonard Schwartz M.D.  06/15/2012 5:54 PM

## 2012-08-10 ENCOUNTER — Ambulatory Visit (INDEPENDENT_AMBULATORY_CARE_PROVIDER_SITE_OTHER): Payer: Federal, State, Local not specified - PPO | Admitting: Internal Medicine

## 2012-08-10 ENCOUNTER — Ambulatory Visit: Payer: Federal, State, Local not specified - PPO

## 2012-08-10 VITALS — BP 153/83 | HR 57 | Temp 97.6°F | Resp 17 | Ht 63.5 in | Wt 270.0 lb

## 2012-08-10 DIAGNOSIS — M25569 Pain in unspecified knee: Secondary | ICD-10-CM

## 2012-08-10 DIAGNOSIS — R269 Unspecified abnormalities of gait and mobility: Secondary | ICD-10-CM

## 2012-08-10 DIAGNOSIS — M1710 Unilateral primary osteoarthritis, unspecified knee: Secondary | ICD-10-CM

## 2012-08-10 DIAGNOSIS — IMO0002 Reserved for concepts with insufficient information to code with codable children: Secondary | ICD-10-CM

## 2012-08-10 DIAGNOSIS — M170 Bilateral primary osteoarthritis of knee: Secondary | ICD-10-CM

## 2012-08-10 DIAGNOSIS — M1990 Unspecified osteoarthritis, unspecified site: Secondary | ICD-10-CM

## 2012-08-10 MED ORDER — IBUPROFEN 600 MG PO TABS: 600.0000 mg | ORAL_TABLET | Freq: Three times a day (TID) | ORAL | Status: DC | PRN

## 2012-08-10 MED ORDER — HYDROCODONE-ACETAMINOPHEN 5-325 MG PO TABS: 1.0000 | ORAL_TABLET | Freq: Four times a day (QID) | ORAL | Status: DC | PRN

## 2012-08-10 NOTE — Progress Notes (Signed)
  Subjective:    Patient ID: Theresa Kirby, female    DOB: September 05, 1955, 57 y.o.   MRN: [id number]  HPI Painful knees when walking and at night trying to sleep. No swelling, reddness, or warmth. No trauma   Review of Systems Has an internist    Objective:   Physical Exam  Vitals reviewed. Constitutional: She is oriented to person, place, and time. She appears well-nourished. No distress.  Eyes: EOM are normal.  Cardiovascular: Normal rate.   Pulmonary/Chest: Effort normal.  Musculoskeletal:       Right knee: She exhibits decreased range of motion and bony tenderness. She exhibits no swelling, no effusion and no erythema. tenderness found.       Left knee: She exhibits decreased range of motion and bony tenderness. tenderness found.  Neurological: [name] and oriented to person, place, and time. Coordination and gait abnormal.  Crepitance/grinding both knees   [facility] reading (PRIMARY) by  Dr.Guest severe osteo arthritis both knees..       Assessment & Plan:  Osteo arthritis Refer to ortho

## 2012-08-10 NOTE — Patient Instructions (Signed)
Wear and Tear Disorders of the Knee (Arthritis, Osteoarthritis) Everyone will experience wear and tear injuries (arthritis, osteoarthritis) of the knee. These are the changes we all get as we age. They come from the joint stress of daily living. The amount of cartilage damage in your knee and your symptoms determine if you need surgery. Mild problems require approximately two months recovery time. More severe problems take several months to recover. With mild problems, your surgeon may find worn and rough cartilage surfaces. With severe changes, your surgeon may find cartilage that has completely worn away and exposed the bone. Loose bodies of bone and cartilage, bone spurs (excess bone growth), and injuries to the menisci (cushions between the large bones of your leg) are also common. All of these problems can cause pain. For a mild wear and tear problem, rough cartilage may simply need to be shaved and smoothed. For more severe problems with areas of exposed bone, your surgeon may use an instrument for roughing up the bone surfaces to stimulate new cartilage growth. Loose bodies are usually removed. Torn menisci may be trimmed or repaired. ABOUT THE ARTHROSCOPIC PROCEDURE Arthroscopy is a surgical technique. It allows your orthopedic surgeon to diagnose and treat your knee injury with accuracy. The surgeon looks into your knee through a small scope. The scope is like a small (pencil-sized) telescope. Arthroscopy is less invasive than open knee surgery. You can expect a more rapid recovery. After the procedure, you will be moved to a recovery area until most of the effects of the medication have worn off. Your caregiver will discuss the test results with you. RECOVERY The severity of the arthritis and the type of procedure performed will determine recovery time. Other important factors include age, physical condition, medical conditions, and the type of rehabilitation program. Strengthening your muscles after  arthroscopy helps guarantee a better recovery. Follow your caregiver's instructions. Use crutches, rest, elevate, ice, and do knee exercises as instructed. Your caregivers will help you and instruct you with exercises and other physical therapy required to regain your mobility, muscle strength, and functioning following surgery. Only take over-the-counter or prescription medicines for pain, discomfort, or fever as directed by your caregiver.  SEEK MEDICAL CARE IF:   There is increased bleeding (more than a small spot) from the wound.  You notice redness, swelling, or increasing pain in the wound.  Pus is coming from wound.  You develop an unexplained oral temperature above 102 F (38.9 C) , or as your caregiver suggests.  You notice a foul smell coming from the wound or dressing.  You have severe pain with motion of the knee. SEEK IMMEDIATE MEDICAL CARE IF:   You develop a rash.  You have difficulty breathing.  You have any allergic problems. MAKE SURE YOU:   Understand these instructions.  Will watch your condition.  Will get help right away if you are not doing well or get worse. Document Released: 07/08/2000 Document Revised: 10/03/2011 Document Reviewed: 12/05/2007 [facility] Patient Information 2013 [place], [place]. Degenerative Arthritis You have osteoarthritis. This is the wear and tear arthritis that comes with aging. It is also called degenerative arthritis. This is common in people past middle age. It is caused by stress on the joints. The large weight bearing joints of the lower extremities are most often affected. The knees, hips, back, neck, and hands can become painful, swollen, and stiff. This is the most common type of arthritis. It comes on with age, carrying too much weight, or from an  injury. Treatment includes resting the sore joint until the pain and swelling improve. Crutches or a walker may be needed for severe flares. Only take over-the-counter or prescription  medicines for pain, discomfort, or fever as directed by your caregiver. Local heat therapy may improve motion. Cortisone shots into the joint are sometimes used to reduce pain and swelling during flares. Osteoarthritis is usually not crippling and progresses slowly. There are things you can do to decrease pain:  Avoid high impact activities.  Exercise regularly.  Low impact exercises such as walking, biking and swimming help to keep the muscles strong and keep normal joint function.  Stretching helps to keep your range of motion.  Lose weight if you are overweight. This reduces joint stress. In severe cases when you have pain at rest or increasing disability, joint surgery may be helpful. See your caregiver for follow-up treatment as recommended.  SEEK IMMEDIATE MEDICAL CARE IF:   You have severe joint pain.  Marked swelling and redness in your joint develops.  You develop a high fever. Document Released: 07/11/2005 Document Revised: 10/03/2011 Document Reviewed: 12/11/2006 [facility] Patient Information 2013 [place], [place]. Osteoarthritis Osteoarthritis is the most common form of arthritis. It is redness, soreness, and swelling (inflammation) affecting the cartilage. Cartilage acts as a cushion, covering the ends of bones where they meet to form a joint. CAUSES  Over time, the cartilage begins to wear away. This causes bone to rub on bone. This produces pain and stiffness in the affected joints. Factors that contribute to this problem are:  Excessive body weight.  Age.  Overuse of joints. SYMPTOMS   People with osteoarthritis usually experience joint pain, swelling, or stiffness.  Over time, the joint may lose its normal shape.  Small deposits of bone (osteophytes) may grow on the edges of the joint.  Bits of bone or cartilage can break off and float inside the joint space. This may cause more pain and damage.  Osteoarthritis can lead to depression, anxiety, feelings of  helplessness, and limitations on daily activities. The most commonly affected joints are in the:  Ends of the fingers.  Thumbs.  Neck.  Lower back.  Knees.  Hips. DIAGNOSIS  Diagnosis is mostly based on your symptoms and exam. Tests may be helpful, including:  X-rays of the affected joint.  A computerized magnetic scan (MRI).  Blood tests to rule out other types of arthritis.  Joint fluid tests. This involves using a needle to draw fluid from the joint and examining the fluid under a microscope. TREATMENT  Goals of treatment are to control pain, improve joint function, maintain a normal body weight, and maintain a healthy lifestyle. Treatment approaches may include:  A prescribed exercise program with rest and joint relief.  Weight control with nutritional education.  Pain relief techniques such as:  Properly applied heat and cold.  Electric pulses delivered to nerve endings under the skin (transcutaneous electrical nerve stimulation, TENS).  Massage.  Certain supplements. Ask your caregiver before using any supplements, especially in combination with prescribed drugs.  Medicines to control pain, such as:  Acetaminophen.  Nonsteroidal anti-inflammatory drugs (NSAIDs), such as naproxen.  Narcotic or central-acting agents, such as tramadol. This drug carries a risk of addiction and is generally prescribed for short-term use.  Corticosteroids. These can be given orally or as injection. This is a short-term treatment, not recommended for routine use.  Surgery to reposition the bones and relieve pain (osteotomy) or to remove loose pieces of bone and cartilage. Joint replacement  may be needed in advanced states of osteoarthritis. HOME CARE INSTRUCTIONS  Your caregiver can recommend specific types of exercise. These may include:  Strengthening exercises. These are done to strengthen the muscles that support joints affected by arthritis. They can be performed with  weights or with exercise bands to add resistance.  Aerobic activities. These are exercises, such as brisk walking or low-impact aerobics, that get your heart pumping. They can help keep your lungs and circulatory system in shape.  Range-of-motion activities. These keep your joints limber.  Balance and agility exercises. These help you maintain daily living skills. Learning about your condition and being actively involved in your care will help improve the course of your osteoarthritis. SEEK MEDICAL CARE IF:   You feel hot or your skin turns red.  You develop a rash in addition to your joint pain.  You have an oral temperature above 102 F (38.9 C). FOR MORE INFORMATION  National Institute of Arthritis and Musculoskeletal and Skin Diseases: www.niams.http://www.myers.net/ [employer] on Aging: https://walker.com/ American College of Rheumatology: www.rheumatology.org Document Released: 07/11/2005 Document Revised: 10/03/2011 Document Reviewed: 10/22/2009 [facility] Patient Information 2013 [place], [place].

## 2012-08-13 ENCOUNTER — Ambulatory Visit (INDEPENDENT_AMBULATORY_CARE_PROVIDER_SITE_OTHER): Payer: Federal, State, Local not specified - PPO | Admitting: Ophthalmology

## 2012-08-16 ENCOUNTER — Ambulatory Visit (INDEPENDENT_AMBULATORY_CARE_PROVIDER_SITE_OTHER): Payer: Federal, State, Local not specified - PPO | Admitting: Ophthalmology

## 2012-08-16 DIAGNOSIS — E11319 Type 2 diabetes mellitus with unspecified diabetic retinopathy without macular edema: Secondary | ICD-10-CM

## 2012-08-16 DIAGNOSIS — H2510 Age-related nuclear cataract, unspecified eye: Secondary | ICD-10-CM

## 2012-08-16 DIAGNOSIS — H3530 Unspecified macular degeneration: Secondary | ICD-10-CM

## 2012-08-16 DIAGNOSIS — E1139 Type 2 diabetes mellitus with other diabetic ophthalmic complication: Secondary | ICD-10-CM

## 2012-08-16 DIAGNOSIS — H43819 Vitreous degeneration, unspecified eye: Secondary | ICD-10-CM

## 2012-08-20 ENCOUNTER — Other Ambulatory Visit: Payer: Self-pay | Admitting: Endocrinology

## 2012-08-20 DIAGNOSIS — E049 Nontoxic goiter, unspecified: Secondary | ICD-10-CM

## 2012-08-23 ENCOUNTER — Ambulatory Visit
Admission: RE | Admit: 2012-08-23 | Discharge: 2012-08-23 | Disposition: A | Discharge status: Home / Self Care | Payer: Federal, State, Local not specified - PPO | Source: Ambulatory Visit | Attending: Endocrinology | Admitting: Endocrinology

## 2012-08-23 DIAGNOSIS — E049 Nontoxic goiter, unspecified: Secondary | ICD-10-CM

## 2013-02-17 ENCOUNTER — Encounter (HOSPITAL_BASED_OUTPATIENT_CLINIC_OR_DEPARTMENT_OTHER): Payer: Self-pay | Admitting: *Deleted

## 2013-02-17 ENCOUNTER — Emergency Department (HOSPITAL_BASED_OUTPATIENT_CLINIC_OR_DEPARTMENT_OTHER)
Admission: EM | Admit: 2013-02-17 | Discharge: 2013-02-17 | Disposition: A | Discharge status: Home / Self Care | Payer: Federal, State, Local not specified - PPO | Attending: Emergency Medicine | Admitting: Emergency Medicine

## 2013-02-17 DIAGNOSIS — IMO0002 Reserved for concepts with insufficient information to code with codable children: Secondary | ICD-10-CM | POA: Insufficient documentation

## 2013-02-17 DIAGNOSIS — E119 Type 2 diabetes mellitus without complications: Secondary | ICD-10-CM | POA: Insufficient documentation

## 2013-02-17 DIAGNOSIS — J45909 Unspecified asthma, uncomplicated: Secondary | ICD-10-CM | POA: Insufficient documentation

## 2013-02-17 DIAGNOSIS — H60399 Other infective otitis externa, unspecified ear: Secondary | ICD-10-CM | POA: Insufficient documentation

## 2013-02-17 DIAGNOSIS — H6091 Unspecified otitis externa, right ear: Secondary | ICD-10-CM

## 2013-02-17 DIAGNOSIS — G4730 Sleep apnea, unspecified: Secondary | ICD-10-CM | POA: Insufficient documentation

## 2013-02-17 DIAGNOSIS — Z79899 Other long term (current) drug therapy: Secondary | ICD-10-CM | POA: Insufficient documentation

## 2013-02-17 DIAGNOSIS — L299 Pruritus, unspecified: Secondary | ICD-10-CM | POA: Insufficient documentation

## 2013-02-17 DIAGNOSIS — Z9981 Dependence on supplemental oxygen: Secondary | ICD-10-CM | POA: Insufficient documentation

## 2013-02-17 DIAGNOSIS — R21 Rash and other nonspecific skin eruption: Secondary | ICD-10-CM | POA: Insufficient documentation

## 2013-02-17 MED ORDER — ANTIPYRINE-BENZOCAINE 5.4-1.4 % OT SOLN
3.0000 [drp] | Freq: Once | OTIC | Status: AC
  Administered 2013-02-17: 4 [drp] via OTIC
  Filled 2013-02-17: qty 10

## 2013-02-17 MED ORDER — CIPROFLOXACIN-DEXAMETHASONE 0.3-0.1 % OT SUSP
4.0000 [drp] | Freq: Once | OTIC | Status: AC
  Administered 2013-02-17: 4 [drp] via OTIC
  Filled 2013-02-17: qty 7.5

## 2013-02-17 NOTE — ED Provider Notes (Signed)
Medical screening examination/treatment/procedure(s) were performed by non-physician practitioner and as supervising physician I was immediately available for consultation/collaboration.   Daissy Yerian, MD 02/17/13 2340 

## 2013-02-17 NOTE — ED Provider Notes (Signed)
CSN: [id number]     Arrival date & time 02/17/13  2106 History     First MD Initiated Contact with Patient 02/17/13 2157     Chief Complaint  Patient presents with  . Otalgia   (Consider location/radiation/quality/duration/timing/severity/associated sxs/prior Treatment) HPI  57 year old female with history of diabetes presents complaining of right ear pain. Patient reports gradual onset of pain and itchiness to the right ear ongoing for the past 3 or 4 days. Symptom seems to worsen when she lays on the affected side. Patient has use Q-tip to clean her ear with minimal improvement. Pain does radiates to the back of her ear. Pain has been persistent, pt is having difficulty sleeping due to pain.  No complaints of fever, hearing changes, ringing in ear, sneezing, running nose, sore throat, neck pain, or rash. Denies any ear drainage.  Ratient denies swimming in [place], [place], or pool. No other specific treatment tried. Patient denies taking any NSAIDs on a chronic basis. Denies any chest pain, shortness of breath, nausea, diaphoresis.  Past Medical History  Diagnosis Date  . Sleep apnea     uses cpap  . Diabetes mellitus   . Asthma    Past Surgical History  Procedure Laterality Date  . Cesarean section    . Dilation and curettage of uterus    . Endometrial ablation    . Ganglion cyst excision      rt arm  . Shoulder surgery     Family History  Problem Relation Age of Onset  . Heart disease Mother   . Hypertension Brother   . Hypertension Maternal Grandmother   . Heart disease Maternal Grandmother    History  Substance Use Topics  . Smoking status: Never Smoker   . Smokeless tobacco: Never Used  . Alcohol Use: No   OB History   Grav Para Term Preterm Abortions TAB SAB Ect Mult Living                 Review of Systems  Constitutional: Negative for fever.  HENT: Positive for ear pain. Negative for hearing loss and ear discharge.   Respiratory: Negative for shortness of  breath.   Cardiovascular: Negative for chest pain.  Skin: Negative for rash.    Allergies  Sulfa antibiotics  Home Medications   Current Outpatient Rx  Name  Route  Sig  Dispense  Refill  . acetaminophen (TYLENOL) 325 MG tablet   Oral   Take 650 mg by mouth every 6 (six) hours as needed.          . busPIRone (BUSPAR) 15 MG tablet   Oral   Take 15 mg by mouth 2 (two) times daily.         . diazepam (VALIUM) 10 MG tablet      USE AS DIRECTED   1 tablet   0   . Fluticasone-Salmeterol (ADVAIR) 250-50 MCG/DOSE AEPB   Inhalation   Inhale 1 puff into the lungs every 12 (twelve) hours.         Marland Kitchen HYDROcodone-acetaminophen (NORCO/VICODIN) 5-325 MG per tablet   Oral   Take 1 tablet by mouth every 6 (six) hours as needed for pain.   30 tablet   0   . HYDROcodone-acetaminophen (VICODIN) 5-500 MG per tablet      USE AS DIRECTED   21 tablet   0   . ibuprofen (ADVIL,MOTRIN) 600 MG tablet   Oral   Take 1 tablet (600 mg total) by mouth every 8 (  eight) hours as needed for pain.   30 tablet   1   . ibuprofen (ADVIL,MOTRIN) 800 MG tablet      USE AS DIRECTED   30 tablet   0   . misoprostol (CYTOTEC) 200 MCG tablet      PT TO INSERT 1 TABLET IN VAGINA 12 HOURS PRIOR TO PROCEDURE;THEN INSERT 1 TABLET IN VAGINA 6 HOURS PRIOR TO PROCEDURE.   2 tablet   0   . Multiple Vitamin (MULTIVITAMIN) capsule   Oral   Take 1 capsule by mouth daily.         . ondansetron (ZOFRAN) 8 MG tablet      1/2-1 q 8 hrs prn nausea   20 tablet   0   . promethazine (PHENERGAN) 25 MG tablet      USE AS DIRECTED   14 tablet   0    BP 171/70  Pulse 66  Temp(Src) 98.3 F (36.8 C) (Oral)  Resp 18  Ht 5\' 4"  (1.626 m)  Wt 275 lb (124.739 kg)  BMI 47.18 kg/m2  SpO2 98% Physical Exam  Nursing note and vitals reviewed. Constitutional: She is oriented to person, place, and time. She appears well-developed and well-nourished. No distress.  HENT:  Head: Atraumatic.  Left Ear:  External ear normal.  Nose: Nose normal.  Mouth/Throat: Oropharynx is clear and moist. No oropharyngeal exudate.  R ear: ear canal mildly erythematous without edema.  A small maculopapular group of rash noted at 9 o'clock position, non pustular, does not appears to be vesicle.  TM normal  Mild tenderness to tragus and helix of earlobe on palpation.    No lymphadenopathy or other rash noted.  Eyes: Conjunctivae and EOM are normal. Pupils are equal, round, and reactive to light.  Neck: Neck supple.  Cardiovascular: Normal rate and regular rhythm.   Pulmonary/Chest: Effort normal and breath sounds normal.  Lymphadenopathy:    She has no cervical adenopathy.  Neurological: [name] and oriented to person, place, and time.  Skin: Skin is warm. No rash noted.  Psychiatric: She has a normal mood and affect.    ED Course   Procedures (including critical care time)  Pt with R ear pain.  Pain does presents similar to otitis externa.  However, there is a small rash to ear canal, may possibly be shingle.  Pt has no prior hx of shingle, no rash to nose, face, or eye.  Plan to treat with ciprodex, auralgan.  If no improvement, pt aware to f/u with ENT for further care.  Return precaution discussed. Doubt referred pain from cardiac disease.  Pt also made aware her BP is high and will need to be rechecked by her PCP.  Labs Reviewed - No data to display No results found. 1. Otitis externa, right     MDM  BP 171/70  Pulse 66  Temp(Src) 98.3 F (36.8 C) (Oral)  Resp 18  Ht 5\' 4"  (1.626 m)  Wt 275 lb (124.739 kg)  BMI 47.18 kg/m2  SpO2 98%   Fayrene Helper, PA-C 02/17/13 2220

## 2013-02-17 NOTE — ED Notes (Signed)
Pt reports (R) ear pain x 2-3 days.

## 2013-08-19 ENCOUNTER — Ambulatory Visit (INDEPENDENT_AMBULATORY_CARE_PROVIDER_SITE_OTHER): Payer: Federal, State, Local not specified - PPO | Admitting: Ophthalmology

## 2013-08-19 DIAGNOSIS — H2510 Age-related nuclear cataract, unspecified eye: Secondary | ICD-10-CM

## 2013-08-19 DIAGNOSIS — E1139 Type 2 diabetes mellitus with other diabetic ophthalmic complication: Secondary | ICD-10-CM

## 2013-08-19 DIAGNOSIS — H43819 Vitreous degeneration, unspecified eye: Secondary | ICD-10-CM

## 2013-08-19 DIAGNOSIS — E1165 Type 2 diabetes mellitus with hyperglycemia: Secondary | ICD-10-CM

## 2013-08-19 DIAGNOSIS — H3530 Unspecified macular degeneration: Secondary | ICD-10-CM

## 2013-08-19 DIAGNOSIS — E11319 Type 2 diabetes mellitus with unspecified diabetic retinopathy without macular edema: Secondary | ICD-10-CM

## 2013-08-28 ENCOUNTER — Other Ambulatory Visit: Payer: Self-pay | Admitting: Endocrinology

## 2013-08-28 DIAGNOSIS — E049 Nontoxic goiter, unspecified: Secondary | ICD-10-CM

## 2014-08-25 ENCOUNTER — Ambulatory Visit (INDEPENDENT_AMBULATORY_CARE_PROVIDER_SITE_OTHER): Payer: Federal, State, Local not specified - PPO | Admitting: Ophthalmology

## 2014-08-28 ENCOUNTER — Ambulatory Visit
Admission: RE | Admit: 2014-08-28 | Discharge: 2014-08-28 | Disposition: A | Discharge status: Home / Self Care | Payer: Federal, State, Local not specified - PPO | Source: Ambulatory Visit | Attending: Endocrinology | Admitting: Endocrinology

## 2014-08-28 DIAGNOSIS — E049 Nontoxic goiter, unspecified: Secondary | ICD-10-CM

## 2014-09-18 ENCOUNTER — Other Ambulatory Visit: Payer: Self-pay | Admitting: Endocrinology

## 2014-09-18 DIAGNOSIS — E049 Nontoxic goiter, unspecified: Secondary | ICD-10-CM

## 2014-12-24 ENCOUNTER — Ambulatory Visit: Payer: Worker's Compensation

## 2014-12-24 ENCOUNTER — Ambulatory Visit (INDEPENDENT_AMBULATORY_CARE_PROVIDER_SITE_OTHER): Payer: Federal, State, Local not specified - PPO | Admitting: Family Medicine

## 2014-12-24 VITALS — BP 146/70 | HR 62 | Temp 97.6°F | Resp 18 | Ht 63.0 in | Wt 296.8 lb

## 2014-12-24 DIAGNOSIS — S66911A Strain of unspecified muscle, fascia and tendon at wrist and hand level, right hand, initial encounter: Secondary | ICD-10-CM | POA: Diagnosis not present

## 2014-12-24 DIAGNOSIS — M25531 Pain in right wrist: Secondary | ICD-10-CM

## 2014-12-24 NOTE — Progress Notes (Addendum)
Theresa Kirby November 05, 1955 58 y.o.   Chief Complaint  Patient presents with  . Hand Injury    Rt Hand pain / injury x yesterday     Date of Injury: 12/23/14  History of Present Illness:  Presents for evaluation of work-related complaint. 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  Yesterday she had to pull the brake several times and it seemed like the brake was tighter than usual.  She felt some soreness- no discrete injury but the hand become more sore as her route went on. Still sore today so she came in to be seen No other injury   ROS She is OW ok today She is right handed   Allergies  Allergen Reactions  . Sulfa Antibiotics Hives     Current medications reviewed and updated. Past medical history, family history, social history have been reviewed and updated.   Physical Exam GEN: WDWN, NAD, Non-toxic, A & O x 3, obese, looks well HEENT: Atraumatic, Normocephalic. Neck supple. No masses, No LAD. Ears and Nose: No external deformity. CV: RRR, No M/G/R. No JVD. No thrill. No extra heart sounds. PULM: CTA B, no wheezes, crackles, rhonchi. No retractions. No resp. distress. No accessory muscle use. EXTR: No c/c/e NEURO Normal gait.  PSYCH: Normally interactive. Conversant. Not depressed or anxious appearing.  Calm demeanor.  Right hand and wrist: mild to moderate tenderness over the ulnar aspect of the right wrist.  No swelling, heat or redness.  Poor grip strength likely due to poor effort secondary to pain.  Normal perfusion.  Normal radial pulse.  The hand is non- tender  [facility] reading (PRIMARY) by  Dr. Lorelei Pont. Right wrist: negative  Placed in a non- spica right wrist brace- this felt good to her  Assessment and Plan:  Wrist strain, right, initial encounter - Plan: DG Wrist Complete Right  Right wrist pain over-use injury of right wrist.  Placed in a spica splint, and  will put her on modified work until recheck here on Friday.  Completed separate postal service form which should be scanned into EMR.

## 2014-12-24 NOTE — Addendum Note (Signed)
Addended by: Lamar Blinks C on: 12/24/2014 09:24 AM   Modules accepted: Level of Service

## 2014-12-24 NOTE — Patient Instructions (Signed)
Please come back and see [place] on Friday for a recheck of your right wrist strain.

## 2014-12-26 ENCOUNTER — Ambulatory Visit (INDEPENDENT_AMBULATORY_CARE_PROVIDER_SITE_OTHER): Admitting: Emergency Medicine

## 2014-12-26 VITALS — BP 134/72 | HR 74 | Temp 98.4°F | Resp 17 | Ht 66.0 in | Wt 300.0 lb

## 2014-12-26 DIAGNOSIS — S66911D Strain of unspecified muscle, fascia and tendon at wrist and hand level, right hand, subsequent encounter: Secondary | ICD-10-CM | POA: Diagnosis not present

## 2014-12-26 DIAGNOSIS — M25531 Pain in right wrist: Secondary | ICD-10-CM | POA: Diagnosis not present

## 2014-12-26 MED ORDER — NAPROXEN SODIUM 550 MG PO TABS: 550.0000 mg | ORAL_TABLET | Freq: Two times a day (BID) | ORAL | Status: DC

## 2014-12-26 NOTE — Patient Instructions (Signed)

## 2014-12-26 NOTE — Progress Notes (Signed)
Subjective:  Patient ID: Theresa Kirby, female    DOB: January 01, 1956  Age: 59 y.o. MRN: [id number]  CC: Follow-up   HPI Theresa Kirby presents  for follow-up of her right hand injury. She was seen 3 days ago by Dr. Edilia Bo who treated her with thumb spica splint. She had right wrist pain. Mostly in the ulnar aspect. Her pain is resolved completely she said she's been treated with bilateral sterile injections in her knees yesterday. She has no swelling or ecchymosis no erythema. He does describe some tingling in the tips of her fingers on the ulnar distribution. But this is improving.  Outpatient Prescriptions Prior to Visit  Medication Sig Dispense Refill  . Fluticasone-Salmeterol (ADVAIR) 250-50 MCG/DOSE AEPB Inhale 1 puff into the lungs every 12 (twelve) hours.    Marland Kitchen levothyroxine (SYNTHROID, LEVOTHROID) 50 MCG tablet Take 50 mcg by mouth daily before breakfast.    . Multiple Vitamin (MULTIVITAMIN) capsule Take 1 capsule by mouth daily.    Marland Kitchen acetaminophen (TYLENOL) 325 MG tablet Take 650 mg by mouth every 6 (six) hours as needed.     Marland Kitchen ibuprofen (ADVIL,MOTRIN) 600 MG tablet Take 1 tablet (600 mg total) by mouth every 8 (eight) hours as needed for pain. (Patient not taking: Reported on 12/26/2014) 30 tablet 1   No facility-administered medications prior to visit.    History   Social History  . Marital Status: Single    Spouse Name: N/A  . Number of Children: N/A  . Years of Education: N/A   Social History Main Topics  . Smoking status: Never Smoker   . Smokeless tobacco: Never Used  . Alcohol Use: No  . Drug Use: No  . Sexual Activity: Not Currently    Birth Control/ Protection: None   Other Topics Concern  . None   Social History Narrative    Family History  Problem Relation Age of Onset  . Heart disease Mother   . Hypertension Brother   . Hypertension Maternal Grandmother   . Heart disease Maternal Grandmother     Past Medical History  Diagnosis Date  . Sleep  apnea     uses cpap  . Diabetes mellitus   . Asthma      Review of Systems  Constitutional: Negative for fever, chills and appetite change.  HENT: Negative for congestion, ear pain, postnasal drip, sinus pressure and sore throat.   Eyes: Negative for pain and redness.  Respiratory: Negative for cough, shortness of breath and wheezing.   Cardiovascular: Negative for leg swelling.  Gastrointestinal: Negative for nausea, vomiting, abdominal pain, diarrhea, constipation and blood in stool.  Endocrine: Negative for polyuria.  Genitourinary: Negative for dysuria, urgency, frequency and flank pain.  Musculoskeletal: Negative for gait problem.  Skin: Negative for rash.  Neurological: Negative for weakness and headaches.  Psychiatric/Behavioral: Negative for confusion and decreased concentration. The patient is not nervous/anxious.     Objective:  BP 134/72 mmHg  Pulse 74  Temp(Src) 98.4 F (36.9 C) (Oral)  Resp 17  Ht 5\' 6"  (1.676 m)  Wt 300 lb (136.079 kg)  BMI 48.44 kg/m2  SpO2 94%  BP Readings from Last 3 Encounters:  12/26/14 134/72  12/24/14 146/70  02/17/13 171/70    Wt Readings from Last 3 Encounters:  12/26/14 300 lb (136.079 kg)  12/24/14 296 lb 12.8 oz (134.628 kg)  02/17/13 275 lb (124.739 kg)    Physical Exam  Constitutional: She is oriented to person, place, and time. She appears  well-developed and well-nourished.  HENT:  Head: Normocephalic and atraumatic.  Eyes: Conjunctivae are normal. Pupils are equal, round, and reactive to light.  Pulmonary/Chest: Effort normal.  Musculoskeletal: She exhibits no edema.  Neurological: [name] and oriented to person, place, and time.  Skin: Skin is dry.  Psychiatric: She has a normal mood and affect. Her behavior is normal. Thought content normal.    Lab Results  Component Value Date   WBC 5.7 03/11/2012   HGB 12.7 03/11/2012   HCT 42.1 03/11/2012   PLT 214 12/13/2011   GLUCOSE 108* 12/13/2011   ALT <8  02/11/2007   AST 17 02/11/2007   NA 140 12/13/2011   K 3.6 12/13/2011   CL 102 12/13/2011   CREATININE 0.92 12/13/2011   BUN 14 12/13/2011   CO2 29 12/13/2011      .  Assessment & Plan:   [name] was seen today for follow-up.  Diagnoses and all orders for this visit:  Right wrist pain  Wrist strain, right, subsequent encounter  Other orders -     naproxen sodium (ANAPROX DS) 550 MG tablet; Take 1 tablet (550 mg total) by mouth 2 (two) times daily with a meal.   She describes complete resolution of her symptoms in her hand. She was discharged with follow-up as needed  I am having [name] start on naproxen sodium. I am also having her maintain her Fluticasone-Salmeterol, multivitamin, acetaminophen, ibuprofen, and levothyroxine.  Meds ordered this encounter  Medications  . naproxen sodium (ANAPROX DS) 550 MG tablet    Sig: Take 1 tablet (550 mg total) by mouth 2 (two) times daily with a meal.    Dispense:  40 tablet    Refill:  0    Appropriate red flag conditions were discussed with the patient as well as actions that should be taken.  Patient expressed his understanding.  Follow-up: Return if symptoms worsen or fail to improve.  Roselee Culver, MD

## 2015-03-09 ENCOUNTER — Ambulatory Visit
Admission: RE | Admit: 2015-03-09 | Discharge: 2015-03-09 | Disposition: A | Discharge status: Home / Self Care | Payer: Federal, State, Local not specified - PPO | Source: Ambulatory Visit | Attending: Endocrinology | Admitting: Endocrinology

## 2015-03-09 DIAGNOSIS — E049 Nontoxic goiter, unspecified: Secondary | ICD-10-CM

## 2015-03-19 ENCOUNTER — Inpatient Hospital Stay: Admission: RE | Admit: 2015-03-19 | Payer: Self-pay | Source: Ambulatory Visit

## 2015-03-23 ENCOUNTER — Other Ambulatory Visit: Payer: Self-pay | Admitting: Endocrinology

## 2015-03-23 DIAGNOSIS — E049 Nontoxic goiter, unspecified: Secondary | ICD-10-CM

## 2015-06-25 ENCOUNTER — Other Ambulatory Visit: Payer: Self-pay | Admitting: Gastroenterology

## 2015-07-15 ENCOUNTER — Encounter (HOSPITAL_COMMUNITY): Payer: Self-pay | Admitting: *Deleted

## 2015-07-21 ENCOUNTER — Ambulatory Visit (HOSPITAL_COMMUNITY)
Admission: RE | Admit: 2015-07-21 | Discharge: 2015-07-21 | Disposition: A | Discharge status: Home / Self Care | Payer: Federal, State, Local not specified - PPO | Source: Ambulatory Visit | Attending: Gastroenterology | Admitting: Gastroenterology

## 2015-07-21 ENCOUNTER — Encounter (HOSPITAL_COMMUNITY)
Admission: RE | Disposition: A | Discharge status: Home / Self Care | Payer: Self-pay | Source: Ambulatory Visit | Attending: Gastroenterology

## 2015-07-21 ENCOUNTER — Ambulatory Visit (HOSPITAL_COMMUNITY): Payer: Federal, State, Local not specified - PPO | Admitting: Registered Nurse

## 2015-07-21 ENCOUNTER — Encounter (HOSPITAL_COMMUNITY): Payer: Self-pay

## 2015-07-21 DIAGNOSIS — M170 Bilateral primary osteoarthritis of knee: Secondary | ICD-10-CM | POA: Diagnosis not present

## 2015-07-21 DIAGNOSIS — Z9981 Dependence on supplemental oxygen: Secondary | ICD-10-CM | POA: Diagnosis not present

## 2015-07-21 DIAGNOSIS — Z1211 Encounter for screening for malignant neoplasm of colon: Secondary | ICD-10-CM | POA: Insufficient documentation

## 2015-07-21 DIAGNOSIS — Z86010 Personal history of colonic polyps: Secondary | ICD-10-CM | POA: Insufficient documentation

## 2015-07-21 DIAGNOSIS — K648 Other hemorrhoids: Secondary | ICD-10-CM | POA: Diagnosis not present

## 2015-07-21 DIAGNOSIS — E119 Type 2 diabetes mellitus without complications: Secondary | ICD-10-CM | POA: Diagnosis not present

## 2015-07-21 DIAGNOSIS — Z79899 Other long term (current) drug therapy: Secondary | ICD-10-CM | POA: Diagnosis not present

## 2015-07-21 DIAGNOSIS — J45909 Unspecified asthma, uncomplicated: Secondary | ICD-10-CM | POA: Diagnosis not present

## 2015-07-21 DIAGNOSIS — Z7951 Long term (current) use of inhaled steroids: Secondary | ICD-10-CM | POA: Diagnosis not present

## 2015-07-21 DIAGNOSIS — G4730 Sleep apnea, unspecified: Secondary | ICD-10-CM | POA: Diagnosis not present

## 2015-07-21 DIAGNOSIS — Z6841 Body Mass Index (BMI) 40.0 and over, adult: Secondary | ICD-10-CM | POA: Diagnosis not present

## 2015-07-21 HISTORY — DX: Unspecified osteoarthritis, unspecified site: M19.90

## 2015-07-21 HISTORY — PX: COLONOSCOPY WITH PROPOFOL: SHX5780

## 2015-07-21 LAB — GLUCOSE, CAPILLARY: GLUCOSE-CAPILLARY: 103 mg/dL — AB (ref 65–99)

## 2015-07-21 SURGERY — COLONOSCOPY WITH PROPOFOL
Anesthesia: Monitor Anesthesia Care

## 2015-07-21 MED ORDER — PROPOFOL 10 MG/ML IV BOLUS
INTRAVENOUS | Status: DC | PRN
  Administered 2015-07-21: 30 mg via INTRAVENOUS
  Administered 2015-07-21: 20 mg via INTRAVENOUS

## 2015-07-21 MED ORDER — PROPOFOL 500 MG/50ML IV EMUL
INTRAVENOUS | Status: DC | PRN
  Administered 2015-07-21: 130 ug/kg/min via INTRAVENOUS

## 2015-07-21 MED ORDER — SODIUM CHLORIDE 0.9 % IV SOLN: INTRAVENOUS | Status: DC

## 2015-07-21 MED ORDER — PROPOFOL 10 MG/ML IV BOLUS
INTRAVENOUS | Status: AC
  Filled 2015-07-21: qty 40

## 2015-07-21 MED ORDER — LIDOCAINE HCL (CARDIAC) 20 MG/ML IV SOLN
INTRAVENOUS | Status: AC
  Filled 2015-07-21: qty 5

## 2015-07-21 MED ORDER — LIDOCAINE HCL (CARDIAC) 20 MG/ML IV SOLN
INTRAVENOUS | Status: DC | PRN
  Administered 2015-07-21: 100 mg via INTRAVENOUS

## 2015-07-21 MED ORDER — LACTATED RINGERS IV SOLN
INTRAVENOUS | Status: DC
  Administered 2015-07-21: 1000 mL via INTRAVENOUS

## 2015-07-21 SURGICAL SUPPLY — 22 items

## 2015-07-21 NOTE — Op Note (Signed)
[facility] [place], [id number]   OPERATIVE PROCEDURE REPORT  PATIENT: Theresa Kirby, Theresa Kirby  MR#: [id number] BIRTHDATE: 1955/11/27 GENDER: female ENDOSCOPIST: Edmonia James, MD ASSISTANT:   Joline Maxcy, technician & Carolynn Comment, RN PROCEDURE DATE: 2015/08/08 PRE-PROCEDURE PREPARATION: Patient fasted for 4 hours prior to procedure. The patient was prepped with a gallon of Golytely the night prior to the procedure. PRE-PROCEDURE PHYSICAL: Patient has stable vital signs.  Neck is supple.  There is no JVD, thyromegaly or LAD.  Chest clear to auscultation.  S1 and S2 regular.  Abdomen soft, obese, non-distended, non-tender with NABS. PROCEDURE:     Colonoscopy, diagnostic ASA CLASS:     Class IV INDICATIONS:     1.  Colorectal cancer screening. MEDICATIONS:     Monitored anesthesia care  DESCRIPTION OF PROCEDURE: After the risks, benefits, and alternatives of the procedure were thoroughly explained [including a 10% missed rate of cancer and polyps], informed consent was obtained. Digital rectal exam was performed.  The [id number]  was introduced through the anus  and advanced to the cecum, which was identified by both the appendix and ileocecal valve.d.  The quality of the prep was fair. . Multiple washes were done. Small lesions could be missed. The instrument was then slowly withdrawn as the colon was fully examined. Estimated blood loss is zero unless otherwise noted in this procedure report.     COLON FINDINGS: The entire colonic mucosa appeared healthy with a normal vascular pattern.  No masses, polyps, diverticula or AVMs were noted. The appendiceal orifice and the ICV were identified and photographed. Retroflexed views revealed prominent internal hemorrhoids  The patient tolerated the procedure without immediate complications.  The scope was then withdrawn from the patient and the procedure terminated.  TIME TO CECUM:   4  minutes 00 seconds WITHDRAW TIME:  7 minutes 00 seconds  IMPRESSION:     1) Normal colonoscopy upto the cecum; there was a lot of residual debris ion the colon; multiple washes were done. small polyps could be missed. 2) Prominent internal hemorrhoids noted on retroflexion.  RECOMMENDATIONS:     1.  Continue current medications 2.  High fiber diet with liberal fluid intake. 3.  OP follow-up is advised on a PRN basis.  REPEAT EXAM:      In 5 years  for a repeat colonoscopy. If the patient has any abnormal GI symptoms in the interim, she have been advised to contact the office as soon as possible for further recommendations.   REFERRED [id number] Ashby Dawes, M.D. eSigned:  Edmonia James, MD 08-Aug-2015 7:54 AM  CPT CODES:     [phone] Colonoscopy, flexible, proximal to splenic flexure; diagnostic, with or without collection of specimen(s) by brushing or washing, with or without colon decompression (separate procedure) ICD CODES:     Z12.11 Encounter for screening for malignant neoplasm of colon  The ICD and CPT codes recommended by this software are interpretations from the data that the clinical staff has captured with the software.  The verification of the translation of this report to the ICD and CPT codes and modifiers is the sole responsibility of the health care institution and practicing physician where this report was generated.  [employer] will not be held responsible for the validity of the ICD and CPT codes included on this report.  AMA assumes no liability for data contained or not contained herein. CPT is a Designer, television/film set of the [employer].  PATIENT NAME:  Theresa Kirby, Theresa Kirby MR#: [id number]

## 2015-07-21 NOTE — Anesthesia Preprocedure Evaluation (Addendum)
Anesthesia Evaluation  Patient identified by MRN, date of birth, ID band Patient awake    Reviewed: Allergy & Precautions, H&P , NPO status , Patient's Chart, lab work & pertinent test results, reviewed documented beta blocker date and time   Airway Mallampati: II  TM Distance: >3 FB Neck ROM: full    Dental   Pulmonary asthma , sleep apnea ,    breath sounds clear to auscultation       Cardiovascular negative cardio ROS   Rhythm:Regular Rate:Normal     Neuro/Psych negative neurological ROS  negative psych ROS   GI/Hepatic negative GI ROS, Neg liver ROS,   Endo/Other  diabetes, Type 2Morbid obesity  Renal/GU negative Renal ROS  negative genitourinary   Musculoskeletal   Abdominal   Peds  Hematology negative hematology ROS (+)   Anesthesia Other Findings See surgeon's H&P   Reproductive/Obstetrics negative OB ROS                           Anesthesia Physical  Anesthesia Plan  ASA: III  Anesthesia Plan: MAC   Post-op Pain Management:    Induction: Intravenous  Airway Management Planned:   Additional Equipment:   Intra-op Plan:   Post-operative Plan:   Informed Consent: I have reviewed the patients History and Physical, chart, labs and discussed the procedure including the risks, benefits and alternatives for the proposed anesthesia with the patient or authorized representative who has indicated his/her understanding and acceptance.   Dental Advisory Given  Plan Discussed with: CRNA and Surgeon  Anesthesia Plan Comments:         Anesthesia Quick Evaluation

## 2015-07-21 NOTE — Anesthesia Postprocedure Evaluation (Signed)
Anesthesia Post Note  Patient: Theresa Kirby  Procedure(s) Performed: Procedure(s) (LRB): COLONOSCOPY WITH PROPOFOL (N/A)  Patient location during evaluation: PACU Anesthesia Type: MAC Level of consciousness: awake and alert Pain management: pain level controlled Vital Signs Assessment: post-procedure vital signs reviewed and stable Respiratory status: spontaneous breathing Cardiovascular status: stable Anesthetic complications: no    Last Vitals:  Filed Vitals:   07/21/15 0810 07/21/15 0815  BP: 139/67 126/59  Pulse: 54 54  Temp:    Resp: 22 22    Last Pain: There were no vitals filed for this visit.               Nolon Nations

## 2015-07-21 NOTE — Transfer of Care (Signed)
Immediate Anesthesia Transfer of Care Note  Patient: Theresa Kirby  Procedure(s) Performed: Procedure(s): COLONOSCOPY WITH PROPOFOL (N/A)  Patient Location: PACU and Endoscopy Unit  Anesthesia Type:MAC  Level of Consciousness: awake, alert , oriented and patient cooperative  Airway & Oxygen Therapy: Patient Spontanous Breathing and Patient connected to face mask oxygen  Post-op Assessment: Report given to RN, Post -op Vital signs reviewed and stable and Patient moving all extremities  Post vital signs: Reviewed and stable  Last Vitals:  Filed Vitals:   07/21/15 0700  BP: 152/71  Pulse: 56  Temp: 36.6 C  Resp: 14    Complications: No apparent anesthesia complications

## 2015-07-21 NOTE — H&P (Signed)
Theresa Kirby is an 59 y.o. female.   Chief Complaint: Colorectal cancer screening.  HPI: 59 year old black female here for colorectal cancer screening. he has a history of sleep apnea and is also on home oxygen. She has a history of colonic polyps. See office notes for details.   Past Medical History  Diagnosis Date  . Sleep apnea     uses cpap  . Asthma   . Diabetes mellitus     control with diet  . Arthritis     knees   Past Surgical History  Procedure Laterality Date  . Cesarean section    . Dilation and curettage of uterus    . Endometrial ablation    . Ganglion cyst excision      rt arm  . Shoulder surgery Left     Rotator cuff repair   Family History  Problem Relation Age of Onset  . Heart disease Mother   . Hypertension Brother   . Hypertension Maternal Grandmother   . Heart disease Maternal Grandmother    Social History:  reports that she has never smoked. She has never used smokeless tobacco. She reports that she does not drink alcohol or use illicit drugs.  Allergies:  Allergies  Allergen Reactions  . Sulfa Antibiotics Hives and Shortness Of Breath    Medications Prior to Admission  Medication Sig Dispense Refill  . acetaminophen (TYLENOL) 325 MG tablet Take 650 mg by mouth every 6 (six) hours as needed (pain).     Marland Kitchen albuterol (PROAIR HFA) 108 (90 BASE) MCG/ACT inhaler Inhale 2 puffs into the lungs every 6 (six) hours as needed for wheezing or shortness of breath.     . diclofenac sodium (VOLTAREN) 1 % GEL Apply 2 g topically 2 (two) times daily. Applies to knees.    . ferrous sulfate 325 (65 FE) MG tablet Take 325 mg by mouth 3 (three) times daily with meals.    . Fluticasone-Salmeterol (ADVAIR) 250-50 MCG/DOSE AEPB Inhale 1 puff into the lungs every 12 (twelve) hours.    Marland Kitchen levothyroxine (SYNTHROID, LEVOTHROID) 50 MCG tablet Take 50 mcg by mouth daily before breakfast.    . montelukast (SINGULAIR) 10 MG tablet Take 10 mg by mouth at bedtime.    . Multiple  Vitamin (MULTIVITAMIN) capsule Take 1 capsule by mouth daily.    . Omega-3 Fatty Acids (FISH OIL PO) Take 1 capsule by mouth daily.     Review of Systems  Constitutional: Negative.   HENT: Negative.   Eyes: Negative.   Respiratory: Negative.   Cardiovascular: Negative.   Gastrointestinal: Negative.   Genitourinary: Negative.   Musculoskeletal: Positive for joint pain.  Neurological: Negative.   Endo/Heme/Allergies: Negative.   Psychiatric/Behavioral: Negative.    Last menstrual period 05/12/2012. Physical Exam  Constitutional: She is oriented to person, place, and time. She appears well-developed and well-nourished.  HENT:  Head: Normocephalic and atraumatic.  Eyes: Conjunctivae and EOM are normal. Pupils are equal, round, and reactive to light.  Neck: Normal range of motion. Neck supple.  Cardiovascular: Normal rate and regular rhythm.   GI: Soft. Bowel sounds are normal.  Musculoskeletal: Normal range of motion.  Neurological: [name] and oriented to person, place, and time.  Skin: Skin is warm and dry.  Psychiatric: She has a normal mood and affect. Her behavior is normal. Judgment and thought content normal.   Assessment/Plan Colorectal cancer screening/personal history of colonic polyps/morbid obesity: proceed with a colonoscopy at this time.  Aviya Jarvie  07/21/2015, 6:54 AM

## 2015-07-21 NOTE — Discharge Instructions (Signed)
Colonoscopy, Care After °Refer to this sheet in the next few weeks. These instructions provide you with information on caring for yourself after your procedure. Your health care provider may also give you more specific instructions. Your treatment has been planned according to current medical practices, but problems sometimes occur. Call your health care provider if you have any problems or questions after your procedure. °WHAT TO EXPECT AFTER THE PROCEDURE  °After your procedure, it is typical to have the following: °· A small amount of blood in your stool. °· Moderate amounts of gas and mild abdominal cramping or bloating. °HOME CARE INSTRUCTIONS °· Do not drive, operate machinery, or sign important documents for 24 hours. °· You may shower and resume your regular physical activities, but move at a slower pace for the first 24 hours. °· Take frequent rest periods for the first 24 hours. °· Walk around or put a warm pack on your abdomen to help reduce abdominal cramping and bloating. °· Drink enough fluids to keep your urine clear or pale yellow. °· You may resume your normal diet as instructed by your health care provider. Avoid heavy or fried foods that are hard to digest. °· Avoid drinking alcohol for 24 hours or as instructed by your health care provider. °· Only take over-the-counter or prescription medicines as directed by your health care provider. °· If a tissue sample (biopsy) was taken during your procedure: °¨ Do not take aspirin or blood thinners for 7 days, or as instructed by your health care provider. °¨ Do not drink alcohol for 7 days, or as instructed by your health care provider. °¨ Eat soft foods for the first 24 hours. °SEEK MEDICAL CARE IF: °You have persistent spotting of blood in your stool 2-3 days after the procedure. °SEEK IMMEDIATE MEDICAL CARE IF: °· You have more than a small spotting of blood in your stool. °· You pass large blood clots in your stool. °· Your abdomen is swollen  (distended). °· You have nausea or vomiting. °· You have a fever. °· You have increasing abdominal pain that is not relieved with medicine. °  °This information is not intended to replace advice given to you by your health care provider. Make sure you discuss any questions you have with your health care provider. °  °Document Released: 02/23/2004 Document Revised: 05/01/2013 Document Reviewed: 03/18/2013 °Elsevier Interactive Patient Education ©2016 Elsevier Inc. ° °

## 2015-07-22 ENCOUNTER — Encounter (HOSPITAL_COMMUNITY): Payer: Self-pay | Admitting: Gastroenterology

## 2015-09-02 ENCOUNTER — Ambulatory Visit
Admission: RE | Admit: 2015-09-02 | Discharge: 2015-09-02 | Disposition: A | Discharge status: Home / Self Care | Payer: Federal, State, Local not specified - PPO | Source: Ambulatory Visit | Attending: Endocrinology | Admitting: Endocrinology

## 2015-09-02 DIAGNOSIS — E049 Nontoxic goiter, unspecified: Secondary | ICD-10-CM

## 2016-04-24 DIAGNOSIS — M6282 Rhabdomyolysis: Secondary | ICD-10-CM

## 2016-04-24 HISTORY — DX: Rhabdomyolysis: M62.82

## 2016-05-05 ENCOUNTER — Other Ambulatory Visit: Payer: Self-pay

## 2016-05-05 ENCOUNTER — Emergency Department (HOSPITAL_BASED_OUTPATIENT_CLINIC_OR_DEPARTMENT_OTHER)
Admit: 2016-05-05 | Discharge: 2016-05-05 | Disposition: A | Discharge status: Home / Self Care | Payer: Federal, State, Local not specified - PPO | Attending: Emergency Medicine | Admitting: Emergency Medicine

## 2016-05-05 ENCOUNTER — Encounter (HOSPITAL_COMMUNITY): Payer: Self-pay | Admitting: Emergency Medicine

## 2016-05-05 ENCOUNTER — Emergency Department (HOSPITAL_COMMUNITY)
Admission: EM | Admit: 2016-05-05 | Discharge: 2016-05-05 | Disposition: A | Discharge status: Home / Self Care | Payer: Federal, State, Local not specified - PPO | Attending: Emergency Medicine | Admitting: Emergency Medicine

## 2016-05-05 ENCOUNTER — Emergency Department (HOSPITAL_COMMUNITY): Payer: Federal, State, Local not specified - PPO

## 2016-05-05 DIAGNOSIS — Z79899 Other long term (current) drug therapy: Secondary | ICD-10-CM | POA: Diagnosis not present

## 2016-05-05 DIAGNOSIS — Y929 Unspecified place or not applicable: Secondary | ICD-10-CM | POA: Insufficient documentation

## 2016-05-05 DIAGNOSIS — Y9389 Activity, other specified: Secondary | ICD-10-CM | POA: Insufficient documentation

## 2016-05-05 DIAGNOSIS — M79604 Pain in right leg: Secondary | ICD-10-CM | POA: Diagnosis not present

## 2016-05-05 DIAGNOSIS — R079 Chest pain, unspecified: Secondary | ICD-10-CM | POA: Insufficient documentation

## 2016-05-05 DIAGNOSIS — J45909 Unspecified asthma, uncomplicated: Secondary | ICD-10-CM | POA: Diagnosis not present

## 2016-05-05 DIAGNOSIS — E119 Type 2 diabetes mellitus without complications: Secondary | ICD-10-CM | POA: Insufficient documentation

## 2016-05-05 DIAGNOSIS — X58XXXA Exposure to other specified factors, initial encounter: Secondary | ICD-10-CM | POA: Diagnosis not present

## 2016-05-05 DIAGNOSIS — M79609 Pain in unspecified limb: Secondary | ICD-10-CM | POA: Diagnosis not present

## 2016-05-05 DIAGNOSIS — Y990 Civilian activity done for income or pay: Secondary | ICD-10-CM | POA: Diagnosis not present

## 2016-05-05 LAB — CBC WITH DIFFERENTIAL/PLATELET
BASOS ABS: 0 10*3/uL (ref 0.0–0.1)
Basophils Relative: 0 %
Eosinophils Absolute: 0.1 10*3/uL (ref 0.0–0.7)
Eosinophils Relative: 2 %
HEMATOCRIT: 41 % (ref 36.0–46.0)
HEMOGLOBIN: 13.3 g/dL (ref 12.0–15.0)
Lymphocytes Relative: 22 %
Lymphs Abs: 1.6 10*3/uL (ref 0.7–4.0)
MCH: 26.7 pg (ref 26.0–34.0)
MCHC: 32.4 g/dL (ref 30.0–36.0)
MCV: 82.2 fL (ref 78.0–100.0)
MONOS PCT: 6 %
Monocytes Absolute: 0.4 10*3/uL (ref 0.1–1.0)
NEUTROS ABS: 5.1 10*3/uL (ref 1.7–7.7)
NEUTROS PCT: 70 %
Platelets: 215 10*3/uL (ref 150–400)
RBC: 4.99 MIL/uL (ref 3.87–5.11)
RDW: 15.8 % — ABNORMAL HIGH (ref 11.5–15.5)
WBC: 7.3 10*3/uL (ref 4.0–10.5)

## 2016-05-05 LAB — BASIC METABOLIC PANEL
ANION GAP: 7 (ref 5–15)
BUN: 22 mg/dL — ABNORMAL HIGH (ref 6–20)
CALCIUM: 9.2 mg/dL (ref 8.9–10.3)
CO2: 29 mmol/L (ref 22–32)
Chloride: 105 mmol/L (ref 101–111)
Creatinine, Ser: 0.78 mg/dL (ref 0.44–1.00)
Glucose, Bld: 97 mg/dL (ref 65–99)
Potassium: 3.9 mmol/L (ref 3.5–5.1)
Sodium: 141 mmol/L (ref 135–145)

## 2016-05-05 LAB — TROPONIN I: Troponin I: <0.03 ng/mL (ref <0.03)

## 2016-05-05 MED ORDER — HYDROCODONE-ACETAMINOPHEN 5-325 MG PO TABS: 1.0000 | ORAL_TABLET | ORAL | 0 refills | Status: DC | PRN

## 2016-05-05 MED ORDER — MORPHINE SULFATE (PF) 4 MG/ML IV SOLN
4.0000 mg | Freq: Once | INTRAVENOUS | Status: AC
  Administered 2016-05-05: 4 mg via INTRAVENOUS

## 2016-05-05 MED ORDER — ONDANSETRON HCL 4 MG/2ML IJ SOLN
4.0000 mg | Freq: Once | INTRAMUSCULAR | Status: AC
  Administered 2016-05-05: 4 mg via INTRAVENOUS
  Filled 2016-05-05: qty 2

## 2016-05-05 MED ORDER — METHOCARBAMOL 500 MG PO TABS: 500.0000 mg | ORAL_TABLET | Freq: Two times a day (BID) | ORAL | 0 refills | Status: DC

## 2016-05-05 MED ORDER — NAPROXEN 500 MG PO TABS: 500.0000 mg | ORAL_TABLET | Freq: Two times a day (BID) | ORAL | 0 refills | Status: DC

## 2016-05-05 NOTE — ED Triage Notes (Signed)
Patient started with right leg pain radiating to right hip yesterday.  Patient reports she did not injure the area.  Rates pain as an 8/10.  Patient reports her foot seems swollen on the bottom.  No swelling or discoloration noted by EMS.

## 2016-05-05 NOTE — Progress Notes (Signed)
[facility] consulted to provide patient a walker.  EDCM spoke to patient at bedside.  Patient listed as having [employer].  EDCM explained to patient that there may be a co pay for the walker.  Patient verbalized understanding.  Patient reports she is unable to bear weight on her foot due to pain.  EDCM discussed patient with EDPA and suggested a boot to add more support.  EDPA placed order for boot.  Per EDPA patient ruled out for dvt. EDPA strongly believes patient needing walker for stability.  [facility] provided patient with walker.  Patient agreeable to receive walker from [facility].   Patient's daughter at bedside asking how patient will get to the bathroom.  EDCM suggested bedside commode however, patient refused.  EDCM witnessed patient able to stand with walker.  Patient reports she will make follow up appointment with her pcp as soon as possible.  No further EDCM needs at this time.

## 2016-05-05 NOTE — ED Notes (Signed)
Pt attempted to stand, unable d/t pain in right foot.  Pt's oxygen sats also dropping in to mid 80's.  Roanna Epley, PA-C @ [facility].

## 2016-05-05 NOTE — Discharge Instructions (Signed)
Your labs and ultrasound were unremarkable. Follow up with Dr. Percell Miller for further evaluation of your leg pain. Take medication as prescribed as needed. Please also follow up with your primary care provider. Continue using the incentive spirometer to help improve your breathing. Return to the ER for new or worsening symptoms.

## 2016-05-05 NOTE — ED Notes (Signed)
RN will draw blood work

## 2016-05-05 NOTE — ED Provider Notes (Signed)
[facility] DEPT Provider Note   CSN: [id number] Arrival date & time: 05/05/16  1609  By signing my name below, I, Soijett Blue, attest that this documentation has been prepared under the direction and in the presence of Santonio Speakman Y. Giordano Getman, PA-C Electronically Signed: Soijett Blue, ED Scribe. 05/05/16. 4:42 PM   History   Chief Complaint Chief Complaint  Patient presents with  . Leg Pain    HPI Theresa Kirby is a 60 y.o. female with a PMHx of arthritis in knees, DM, who presents to the Emergency Department brought in by EMS complaining of gradually worsening right leg pain onset yesterday. 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 Pt notes that after pulling her right pant leg down she began to have gradual onset progressive intense pain to her posterior right thigh radiating to her right foot. 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 Pt reports that she was seen at her PCP office PTA and they transferred her to the ED via EMS to rule out a blood clot. Pt notes that they also gave her a toradol injection for her pain. Pt is having associated symptoms of gait problem due to pain. She notes that she has tried Rx 500 mg hydrocodone-acetaminophen with no relief of her symptoms. She denies color change, wound, rash, back pain, SOB, swelling, and any other symptoms. Pt denies estrogen use, birth control use, or PMHx of blood clots.    The history is provided by the patient. No language interpreter was used.    Past Medical History:  Diagnosis Date  . Arthritis    knees  . Asthma   . Diabetes mellitus    control with diet  . Sleep apnea    uses cpap    Patient Active Problem List   Diagnosis Date Noted  . Irregular bleeding 03/15/2012  . Anxiety 02/08/2012  . Obesity 02/08/2012    Past Surgical History:  Procedure Laterality Date  . CESAREAN SECTION      . COLONOSCOPY WITH PROPOFOL N/A 07/21/2015   Procedure: COLONOSCOPY WITH PROPOFOL;  Surgeon: Juanita Craver, MD;  Location: [facility] ENDOSCOPY;  Service: Endoscopy;  Laterality: N/A;  . DILATION AND CURETTAGE OF UTERUS    . ENDOMETRIAL ABLATION    . GANGLION CYST EXCISION     rt arm  . SHOULDER SURGERY Left    Rotator cuff repair    OB History    No data available       Home Medications    Prior to Admission medications   Medication Sig Start Date End Date Taking? Authorizing Provider  acetaminophen (TYLENOL) 325 MG tablet Take 650 mg by mouth every 6 (six) hours as needed (pain).     Historical Provider, MD  albuterol (PROAIR HFA) 108 (90 BASE) MCG/ACT inhaler Inhale 2 puffs into the lungs every 6 (six) hours as needed for wheezing or shortness of breath.  06/20/14   Historical Provider, MD  diclofenac sodium (VOLTAREN) 1 % GEL Apply 2 g topically 2 (two) times daily. Applies to knees. 06/13/14   Historical Provider, MD  ferrous sulfate 325 (65 FE) MG tablet Take 325 mg by mouth 3 (three) times daily with meals.    Historical Provider, MD  Fluticasone-Salmeterol (ADVAIR) 250-50 MCG/DOSE AEPB Inhale 1 puff into the lungs every 12 (twelve) hours.    Historical Provider, MD  levothyroxine (SYNTHROID, LEVOTHROID) 50 MCG tablet Take 50 mcg by mouth daily before breakfast.    Historical Provider, MD  montelukast (SINGULAIR) 10 MG tablet Take 10 mg by mouth at bedtime.    Historical Provider, MD  Multiple Vitamin (MULTIVITAMIN) capsule Take 1 capsule by mouth daily.    Historical Provider, MD  Omega-3 Fatty Acids (FISH OIL PO) Take 1 capsule by mouth daily.    Historical Provider, MD    Family History Family History  Problem Relation Age of Onset  . Heart disease Mother   . Hypertension Brother   . Hypertension Maternal Grandmother   . Heart disease Maternal Grandmother     Social History Social History  Substance Use Topics  . Smoking status: Never Smoker  . Smokeless tobacco: Never  Used  . Alcohol use No     Allergies   Sulfa antibiotics   Review of Systems Review of Systems  Respiratory: Negative for shortness of breath.   Cardiovascular: Positive for chest pain.  Musculoskeletal: Positive for arthralgias and gait problem (due to pain). Negative for joint swelling.  Skin: Negative for color change, rash and wound.     Physical Exam Updated Vital Signs BP 120/56 (BP Location: Left Arm)   Pulse (!) 59   Temp 98.1 F (36.7 C) (Oral)   Resp 20   Ht 5\' 4"  (1.626 m)   Wt 298 lb (135.2 kg)   LMP 05/12/2012   SpO2 96%   BMI 51.15 kg/m   Physical Exam  Constitutional: She is oriented to person, place, and time. She appears well-developed and well-nourished. No distress.  Obese. Appear uncomfortable.  HENT:  Head: Normocephalic and atraumatic.  Eyes: EOM are normal.  Neck: Neck supple.  Cardiovascular: Normal rate.   Pulses:      Dorsalis pedis pulses are 2+ on the right side.       Posterior tibial pulses are 2+ on the right side.  Pulmonary/Chest: Effort normal. No respiratory distress.  Abdominal: She exhibits no distension.  Musculoskeletal: Normal range of motion.       Right upper leg: She exhibits tenderness.       Right lower leg: She exhibits tenderness.       Right foot: There is tenderness.  Marked tenderness from right thigh through right calf and plantar surface of right foot. 2+ dp and pt pulses. 5/5 strength to BLE.  Neurological: [name] and oriented to person, place, and time. She has normal strength.  Skin: Skin is warm and dry.  Psychiatric: She has a normal mood and affect. Her behavior is normal.  Nursing note and vitals reviewed.  ED Treatments / Results  DIAGNOSTIC STUDIES: Oxygen Saturation is 94% on RA, adequate by my interpretation.    Radiology Dg Chest 2 View  Result Date: 05/05/2016 CLINICAL DATA:  Weakness, shortness of breath and right leg pain and swelling EXAM: CHEST  2 VIEW COMPARISON:  10/19/2015  FINDINGS: The heart is slightly enlarged. Central pulmonary vascular congestion is present. No effusion. No focal consolidation. No pneumothorax. IMPRESSION: 1. Mild enlargement of the cardiomediastinal silhouette with central vascular congestion present. 2. No acute consolidation or effusion. Electronically Signed   By: Donavan Foil M.D.   On: 05/05/2016 19:32    ED ECG REPORT   Date: 05/05/2016  Rate: 57  Rhythm: sinus bradycardia  QRS Axis: normal  Intervals: normal  ST/T Wave abnormalities: normal  Conduction Disutrbances:none  Narrative Interpretation:   Old EKG Reviewed: unchanged  I have  personally reviewed the EKG tracing and agree with the computerized printout as noted.  Procedures Procedures (including critical care time)  Medications Ordered in ED Medications  ondansetron (ZOFRAN) injection 4 mg (4 mg Intravenous Given 05/05/16 1723)  morphine 4 MG/ML injection 4 mg (4 mg Intravenous Given 05/05/16 1723)     Initial Impression / Assessment and Plan / ED Course  I have reviewed the triage vital signs and the nursing notes.  Pertinent imaging results that were available during my care of the patient were reviewed by me and considered in my medical decision making (see chart for details).  Clinical Course   6:56 PM CBC and BMP unremarkable. DVT study is pending. Pt with SpO2 94% at triage. After morphine administration nursing staff notified me that pt was de-satting to mid 80s on room air. Improves to mid 90s with 2L O2 via [place]. We attempted to wean to room air and pt now de-satting again to mid 80s on room air sitting in bed. She is breathing comfortably. She denies SOB or denies chest pain. States she has a history of asthma and uses Advair but has not had any flares recently. Denies recent cough or congestion. Lungs CTAB. Does not typically require oxygen supplementation. She has a history of sleep apnea but reports she has been compliant with her CPAP. Discussed with  Dr. Maryan Rued. ?atelectasis from morphine? Will obtain CXR, troponin, EKG. Add CXR. Incentive spirometer.Marland Kitchen  9:42 PM DVT study negative. CXR with cardiomegaly and central vascular congestion but no consolidation or effusion. Pt's oxygen improved on room air after incentive spirometry. At times she will take shallow breaths and de-sat a bit but with reminder she will remember to take deep breaths and her sats will return to normal. She would like to go home. She is still having pain throughout her entire leg when bearing weight. We have given her a CAM boot and walker to help get around the house. Instructed close f/u with PCP and ortho. ER return precautions given.    Final Clinical Impressions(s) / ED Diagnoses   Final diagnoses:  Right leg pain    New Prescriptions New Prescriptions   HYDROCODONE-ACETAMINOPHEN (NORCO/VICODIN) 5-325 MG TABLET    Take 1 tablet by mouth every 4 (four) hours as needed.   METHOCARBAMOL (ROBAXIN) 500 MG TABLET    Take 1 tablet (500 mg total) by mouth 2 (two) times daily.   NAPROXEN (NAPROSYN) 500 MG TABLET    Take 1 tablet (500 mg total) by mouth 2 (two) times daily.   I personally performed the services described in this documentation, which was scribed in my presence. The recorded information has been reviewed and is accurate.    Anne Ng, PA-C 05/05/16 2143    Blanchie Dessert, MD 05/10/16 2050

## 2016-05-05 NOTE — ED Notes (Signed)
Pt provided IS with instruction.  Pt able to return demonstration.

## 2016-05-05 NOTE — Progress Notes (Signed)
VASCULAR LAB PRELIMINARY  PRELIMINARY  PRELIMINARY  PRELIMINARY  Right lower extremity venous duplex completed.    Preliminary report:  Right:  No obvious evidence of DVT, superficial thrombosis, or Baker's cyst. Technically difficult due to body habitus  Jamieson Lisa, RVS 05/05/2016, 7:48 PM

## 2016-05-06 ENCOUNTER — Encounter (HOSPITAL_COMMUNITY): Payer: Self-pay | Admitting: Emergency Medicine

## 2016-05-06 ENCOUNTER — Emergency Department (HOSPITAL_COMMUNITY): Payer: Federal, State, Local not specified - PPO

## 2016-05-06 ENCOUNTER — Observation Stay (HOSPITAL_COMMUNITY)
Admission: EM | Admit: 2016-05-06 | Discharge: 2016-05-09 | Disposition: A | Discharge status: Home / Self Care | Payer: Federal, State, Local not specified - PPO | Attending: Internal Medicine | Admitting: Internal Medicine

## 2016-05-06 DIAGNOSIS — M7989 Other specified soft tissue disorders: Secondary | ICD-10-CM | POA: Insufficient documentation

## 2016-05-06 DIAGNOSIS — M79609 Pain in unspecified limb: Secondary | ICD-10-CM

## 2016-05-06 DIAGNOSIS — R0902 Hypoxemia: Secondary | ICD-10-CM | POA: Diagnosis not present

## 2016-05-06 DIAGNOSIS — G4733 Obstructive sleep apnea (adult) (pediatric): Secondary | ICD-10-CM | POA: Diagnosis not present

## 2016-05-06 DIAGNOSIS — K5909 Other constipation: Secondary | ICD-10-CM | POA: Insufficient documentation

## 2016-05-06 DIAGNOSIS — M79604 Pain in right leg: Principal | ICD-10-CM | POA: Diagnosis present

## 2016-05-06 DIAGNOSIS — M79661 Pain in right lower leg: Secondary | ICD-10-CM | POA: Diagnosis not present

## 2016-05-06 DIAGNOSIS — I899 Noninfective disorder of lymphatic vessels and lymph nodes, unspecified: Secondary | ICD-10-CM

## 2016-05-06 DIAGNOSIS — D509 Iron deficiency anemia, unspecified: Secondary | ICD-10-CM | POA: Diagnosis not present

## 2016-05-06 DIAGNOSIS — R262 Difficulty in walking, not elsewhere classified: Secondary | ICD-10-CM

## 2016-05-06 DIAGNOSIS — J45909 Unspecified asthma, uncomplicated: Secondary | ICD-10-CM | POA: Diagnosis not present

## 2016-05-06 DIAGNOSIS — R0602 Shortness of breath: Secondary | ICD-10-CM | POA: Diagnosis not present

## 2016-05-06 DIAGNOSIS — M6282 Rhabdomyolysis: Secondary | ICD-10-CM | POA: Diagnosis present

## 2016-05-06 HISTORY — DX: Disorder of thyroid, unspecified: E07.9

## 2016-05-06 LAB — CBC WITH DIFFERENTIAL/PLATELET
Basophils Absolute: 0 10*3/uL (ref 0.0–0.1)
Basophils Relative: 0 %
Eosinophils Absolute: 0.1 10*3/uL (ref 0.0–0.7)
Eosinophils Relative: 2 %
HCT: 40.2 % (ref 36.0–46.0)
Hemoglobin: 12.8 g/dL (ref 12.0–15.0)
Lymphocytes Relative: 26 %
Lymphs Abs: 1.4 10*3/uL (ref 0.7–4.0)
MCH: 26.7 pg (ref 26.0–34.0)
MCHC: 31.8 g/dL (ref 30.0–36.0)
MCV: 83.8 fL (ref 78.0–100.0)
Monocytes Absolute: 0.4 10*3/uL (ref 0.1–1.0)
Monocytes Relative: 8 %
Neutro Abs: 3.5 10*3/uL (ref 1.7–7.7)
Neutrophils Relative %: 64 %
Platelets: 183 10*3/uL (ref 150–400)
RBC: 4.8 MIL/uL (ref 3.87–5.11)
RDW: 15.8 % — ABNORMAL HIGH (ref 11.5–15.5)
WBC: 5.4 10*3/uL (ref 4.0–10.5)

## 2016-05-06 LAB — BASIC METABOLIC PANEL
Anion gap: 4 — ABNORMAL LOW (ref 5–15)
BUN: 26 mg/dL — ABNORMAL HIGH (ref 6–20)
CO2: 31 mmol/L (ref 22–32)
Calcium: 9.1 mg/dL (ref 8.9–10.3)
Chloride: 105 mmol/L (ref 101–111)
Creatinine, Ser: 0.78 mg/dL (ref 0.44–1.00)
GFR calc Af Amer: >60 mL/min (ref >60)
GFR calc non Af Amer: >60 mL/min (ref >60)
Glucose, Bld: 92 mg/dL (ref 65–99)
Potassium: 4.1 mmol/L (ref 3.5–5.1)
Sodium: 140 mmol/L (ref 135–145)

## 2016-05-06 LAB — I-STAT CG4 LACTIC ACID, ED
Lactic Acid, Venous: 0.43 mmol/L — ABNORMAL LOW (ref 0.5–1.9)
Lactic Acid, Venous: 0.65 mmol/L (ref 0.5–1.9)

## 2016-05-06 LAB — CK
Total CK: 1167 U/L — ABNORMAL HIGH (ref 38–234)
Total CK: 1077 U/L — ABNORMAL HIGH (ref 38–234)

## 2016-05-06 MED ORDER — MONTELUKAST SODIUM 10 MG PO TABS: 10.0000 mg | ORAL_TABLET | Freq: Every evening | ORAL | Status: DC | PRN

## 2016-05-06 MED ORDER — LIDOCAINE HCL 2 % IJ SOLN
20.0000 mL | Freq: Once | INTRAMUSCULAR | Status: DC
  Filled 2016-05-06: qty 20

## 2016-05-06 MED ORDER — NAPROXEN 500 MG PO TABS
500.0000 mg | ORAL_TABLET | Freq: Two times a day (BID) | ORAL | Status: DC
  Administered 2016-05-06 – 2016-05-09 (×6): 500 mg via ORAL
  Filled 2016-05-06 (×6): qty 1

## 2016-05-06 MED ORDER — OXYCODONE HCL 5 MG PO TABS
10.0000 mg | ORAL_TABLET | Freq: Once | ORAL | Status: AC
  Administered 2016-05-06: 10 mg via ORAL
  Filled 2016-05-06: qty 2

## 2016-05-06 MED ORDER — SODIUM CHLORIDE 0.9 % IV SOLN
INTRAVENOUS | Status: AC
  Administered 2016-05-06 – 2016-05-07 (×2): via INTRAVENOUS

## 2016-05-06 MED ORDER — LEVOTHYROXINE SODIUM 50 MCG PO TABS
50.0000 ug | ORAL_TABLET | Freq: Every day | ORAL | Status: DC
  Administered 2016-05-07 – 2016-05-09 (×3): 50 ug via ORAL
  Filled 2016-05-06 (×3): qty 1

## 2016-05-06 MED ORDER — FERROUS SULFATE 325 (65 FE) MG PO TABS
325.0000 mg | ORAL_TABLET | Freq: Three times a day (TID) | ORAL | Status: DC
  Administered 2016-05-07 – 2016-05-09 (×8): 325 mg via ORAL
  Filled 2016-05-06 (×7): qty 1

## 2016-05-06 MED ORDER — KETOROLAC TROMETHAMINE 30 MG/ML IJ SOLN
30.0000 mg | Freq: Four times a day (QID) | INTRAMUSCULAR | Status: DC | PRN
  Administered 2016-05-07 (×2): 30 mg via INTRAVENOUS
  Filled 2016-05-06 (×2): qty 1

## 2016-05-06 MED ORDER — SODIUM CHLORIDE 0.9% FLUSH
3.0000 mL | Freq: Two times a day (BID) | INTRAVENOUS | Status: DC
  Administered 2016-05-06 – 2016-05-09 (×5): 3 mL via INTRAVENOUS

## 2016-05-06 MED ORDER — IOPAMIDOL (ISOVUE-370) INJECTION 76%: 75.0000 mL | Freq: Once | INTRAVENOUS | Status: DC | PRN

## 2016-05-06 MED ORDER — SODIUM CHLORIDE 0.9% FLUSH
3.0000 mL | INTRAVENOUS | Status: DC | PRN
Start: 2016-05-06 — End: 2016-05-07

## 2016-05-06 MED ORDER — FENTANYL CITRATE (PF) 100 MCG/2ML IJ SOLN
50.0000 ug | Freq: Once | INTRAMUSCULAR | Status: AC
  Administered 2016-05-06: 16:00:00 via INTRAVENOUS
  Filled 2016-05-06: qty 2

## 2016-05-06 MED ORDER — SODIUM CHLORIDE 0.9 % IV SOLN: 250.0000 mL | INTRAVENOUS | Status: DC | PRN

## 2016-05-06 MED ORDER — ALBUTEROL SULFATE HFA 108 (90 BASE) MCG/ACT IN AERS: 2.0000 | INHALATION_SPRAY | Freq: Four times a day (QID) | RESPIRATORY_TRACT | Status: DC | PRN

## 2016-05-06 MED ORDER — ADULT MULTIVITAMIN W/MINERALS CH
1.0000 | ORAL_TABLET | Freq: Every day | ORAL | Status: DC
  Administered 2016-05-07 – 2016-05-09 (×3): 1 via ORAL
  Filled 2016-05-06 (×3): qty 1

## 2016-05-06 MED ORDER — SODIUM CHLORIDE 0.9% FLUSH
3.0000 mL | Freq: Two times a day (BID) | INTRAVENOUS | Status: DC
  Administered 2016-05-06: 3 mL via INTRAVENOUS

## 2016-05-06 MED ORDER — ACETAMINOPHEN 325 MG PO TABS: 650.0000 mg | ORAL_TABLET | Freq: Four times a day (QID) | ORAL | Status: DC | PRN

## 2016-05-06 MED ORDER — METHOCARBAMOL 500 MG PO TABS
500.0000 mg | ORAL_TABLET | Freq: Two times a day (BID) | ORAL | Status: DC
  Administered 2016-05-06 – 2016-05-09 (×6): 500 mg via ORAL
  Filled 2016-05-06 (×6): qty 1

## 2016-05-06 MED ORDER — OXYCODONE HCL 5 MG PO TABS
10.0000 mg | ORAL_TABLET | Freq: Four times a day (QID) | ORAL | Status: DC | PRN
  Administered 2016-05-07 – 2016-05-09 (×6): 10 mg via ORAL
  Filled 2016-05-06 (×6): qty 2

## 2016-05-06 MED ORDER — IOPAMIDOL (ISOVUE-370) INJECTION 76%
100.0000 mL | Freq: Once | INTRAVENOUS | Status: AC | PRN
  Administered 2016-05-06: 100 mL via INTRAVENOUS

## 2016-05-06 MED ORDER — ALBUTEROL SULFATE (2.5 MG/3ML) 0.083% IN NEBU: 2.5000 mg | INHALATION_SOLUTION | Freq: Four times a day (QID) | RESPIRATORY_TRACT | Status: DC | PRN

## 2016-05-06 MED ORDER — SODIUM CHLORIDE 0.9 % IV BOLUS (SEPSIS)
1000.0000 mL | Freq: Once | INTRAVENOUS | Status: AC
  Administered 2016-05-06: 1000 mL via INTRAVENOUS

## 2016-05-06 NOTE — ED Notes (Signed)
Pt in Ct  

## 2016-05-06 NOTE — Consult Note (Signed)
Reason for Consult:  Rule out compartment syndrome right leg Referring Physician:   EDP Merry Proud, PA-C)  Theresa Kirby is an 60 y.o. female.  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  After worsening pain today, she presented to the ED and was found to have rhabdo.  There is concern for an evolving compartment syndrome of her right leg and Ortho is consulted for further evaluation.  She did have her right leg compartment pressures measured and the values were below 30.  She does report slight numbness in her right foot, but her pain is decreasing with pain meds.   Past Medical History:  Diagnosis Date  . Arthritis    knees  . Asthma   . Sleep apnea    uses cpap  . Thyroid disease     Past Surgical History:  Procedure Laterality Date  . CESAREAN SECTION    . COLONOSCOPY WITH PROPOFOL N/A 07/21/2015   Procedure: COLONOSCOPY WITH PROPOFOL;  Surgeon: Juanita Craver, MD;  Location: [facility];  Service: Endoscopy;  Laterality: N/A;  . DILATION AND CURETTAGE OF UTERUS    . ENDOMETRIAL ABLATION    . GANGLION CYST EXCISION     rt arm  . SHOULDER SURGERY Left    Rotator cuff repair    Family History  Problem Relation Age of Onset  . Heart disease Mother   . Hypertension Brother   . Hypertension Maternal Grandmother   . Heart disease Maternal Grandmother     Social History:  reports that she has never smoked. She has never used smokeless tobacco. She reports that she does not drink alcohol or use drugs.  Allergies:  Allergies  Allergen Reactions  . Sulfa Antibiotics Hives and Shortness Of Breath    Medications: I have reviewed the patient's current medications.  Results for orders placed or performed during the hospital encounter of 05/06/16 (from the past 48 hour(s))  CBC with Differential     Status: Abnormal   Collection Time: 05/06/16  3:56 PM  Result Value Ref Range   WBC 5.4 4.0 - 10.5 K/uL   RBC 4.80 3.87  - 5.11 MIL/uL   Hemoglobin 12.8 12.0 - 15.0 g/dL   HCT 40.2 36.0 - 46.0 %   MCV 83.8 78.0 - 100.0 fL   MCH 26.7 26.0 - 34.0 pg   MCHC 31.8 30.0 - 36.0 g/dL   RDW 15.8 (H) 11.5 - 15.5 %   Platelets 183 150 - 400 K/uL   Neutrophils Relative % 64 %   Neutro Abs 3.5 1.7 - 7.7 K/uL   Lymphocytes Relative 26 %   Lymphs Abs 1.4 0.7 - 4.0 K/uL   Monocytes Relative 8 %   Monocytes Absolute 0.4 0.1 - 1.0 K/uL   Eosinophils Relative 2 %   Eosinophils Absolute 0.1 0.0 - 0.7 K/uL   Basophils Relative 0 %   Basophils Absolute 0.0 0.0 - 0.1 K/uL  Basic metabolic panel     Status: Abnormal   Collection Time: 05/06/16  3:56 PM  Result Value Ref Range   Sodium 140 135 - 145 mmol/L   Potassium 4.1 3.5 - 5.1 mmol/L   Chloride 105 101 - 111 mmol/L   CO2 31 22 - 32 mmol/L   Glucose, Bld 92 65 - 99 mg/dL   BUN 26 (H) 6 - 20 mg/dL   Creatinine, Ser 0.78 0.44 - 1.00 mg/dL   Calcium 9.1 8.9 -  10.3 mg/dL   GFR calc non Af Amer >60 >60 mL/min   GFR calc Af Amer >60 >60 mL/min    Comment: (NOTE) The eGFR has been calculated using the CKD EPI equation. This calculation has not been validated in all clinical situations. eGFR's persistently <60 mL/min signify possible Chronic Kidney Disease.    Anion gap 4 (L) 5 - 15  CK     Status: Abnormal   Collection Time: 05/06/16  3:56 PM  Result Value Ref Range   Total CK 1,167 (H) 38 - 234 U/L  I-Stat CG4 Lactic Acid, ED     Status: Abnormal   Collection Time: 05/06/16  4:07 PM  Result Value Ref Range   Lactic Acid, Venous 0.43 (L) 0.5 - 1.9 mmol/L    Dg Chest 2 View  Result Date: 05/05/2016 CLINICAL DATA:  Weakness, shortness of breath and right leg pain and swelling EXAM: CHEST  2 VIEW COMPARISON:  10/19/2015 FINDINGS: The heart is slightly enlarged. Central pulmonary vascular congestion is present. No effusion. No focal consolidation. No pneumothorax. IMPRESSION: 1. Mild enlargement of the cardiomediastinal silhouette with central vascular congestion  present. 2. No acute consolidation or effusion. Electronically Signed   By: Donavan Foil M.D.   On: 05/05/2016 19:32    Review of Systems  All other systems reviewed and are negative.  Blood pressure 122/60, pulse (!) 54, temperature 98.2 F (36.8 C), temperature source Oral, resp. rate 16, height '5\' 4"'  (1.626 m), weight 135.2 kg (298 lb), last menstrual period 05/12/2012, SpO2 97 %. Physical Exam  Constitutional: She is oriented to person, place, and time. She appears well-developed and well-nourished.  HENT:  Head: Normocephalic and atraumatic.  Eyes: Pupils are equal, round, and reactive to light.  Neck: Normal range of motion. Neck supple.  Cardiovascular: Normal rate and regular rhythm.   Respiratory: Effort normal.  GI: Soft. Bowel sounds are normal.  Neurological: [name] and oriented to person, place, and time.  Skin: Skin is warm and dry.  Psychiatric: She has a normal mood and affect.   She does not appear uncomfortable or in distress.  Right LE  Her right leg shows minimally firm compartments anterior and posterior, but not impressively tight. She has equal pulses bilaterally and only slightly decreased sensation when comparing her feet (slightly decreased on the right) All toes show good capillary refill and her foot is warm. She easily flexes and extends her ankle and toes without any significant discomfort. There is no severe pain on exam     Assessment/Plan: Right leg pain of uncertain etiology - r/o compartment syndrome 1)  At this point, her exam is not concerning for compartment syndrome.  She is able to move her right foot and ankle easily and reports decreasing pain.  The EDP Merry Proud -PA) checked her compartments with the [employer] instrument and the pressures were below 30 mmhg.  Will follow.  Mcarthur Rossetti 05/06/2016, 6:34 PM

## 2016-05-06 NOTE — ED Provider Notes (Signed)
[facility] DEPT Provider Note   CSN: [id number] Arrival date & time: 05/06/16  1143     History   Chief Complaint Chief Complaint  Patient presents with  . Leg Pain    HPI Theresa Kirby is a 60 y.o. female.  HPI   59 YOF presents today with complaints of leg pain.  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 She reports she rolled up her hand leg to her knee on the right side. She notes for several hours she was in and out of the mail truck and sitting for prolonged periods of time. She reports that after unrolling her pain away she started having severe pain to her right posterior thigh and lower extremity. Patient reports the pain is very severe, to the point where she cannot ambulate on it. She reports swelling to the bottom of her foot, followed up with her primary care. She was transferred via EMS to the emergency room where she had a workup yesterday. Workup included troponin, CBC, BMP, and ultrasound ruling out DVT. She was feeling better and was discharged home. Daughter notes that she was unable to ambulate at home, and had even worse pain this morning. She notes that the extremity has very minor swelling, warmth to touch, and redness.    Past Medical History:  Diagnosis Date  . Arthritis    knees  . Asthma   . Sleep apnea    uses cpap  . Thyroid disease     Patient Active Problem List   Diagnosis Date Noted  . Leg pain, right 05/06/2016  . Right leg swelling 05/06/2016  . Hypoxia 05/06/2016  . SOB (shortness of breath) 05/06/2016  . Irregular bleeding 03/15/2012  . Anxiety 02/08/2012  . Obesity 02/08/2012    Past Surgical History:  Procedure Laterality Date  . CESAREAN SECTION    . COLONOSCOPY WITH PROPOFOL N/A 07/21/2015   Procedure: COLONOSCOPY WITH PROPOFOL;  Surgeon: Juanita Craver, MD;  Location: [facility];  Service: Endoscopy;  Laterality: N/A;  . DILATION AND CURETTAGE OF UTERUS    . ENDOMETRIAL ABLATION    . GANGLION  CYST EXCISION     rt arm  . SHOULDER SURGERY Left    Rotator cuff repair    OB History    No data available       Home Medications    Prior to Admission medications   Medication Sig Start Date End Date Taking? Authorizing Provider  acetaminophen (TYLENOL) 325 MG tablet Take 650 mg by mouth every 6 (six) hours as needed (pain).    Yes Historical Provider, MD  albuterol (PROAIR HFA) 108 (90 BASE) MCG/ACT inhaler Inhale 2 puffs into the lungs every 6 (six) hours as needed for wheezing or shortness of breath.  06/20/14  Yes Historical Provider, MD  diclofenac sodium (VOLTAREN) 1 % GEL Apply 2 g topically 2 (two) times daily. Applies to knees. 06/13/14  Yes Historical Provider, MD  ferrous sulfate 325 (65 FE) MG tablet Take 325 mg by mouth 3 (three) times daily with meals.   Yes Historical Provider, MD  Fluticasone-Salmeterol (ADVAIR) 250-50 MCG/DOSE AEPB Inhale 1 puff into the lungs 2 (two) times daily.   Yes Historical Provider, MD  HYDROcodone-acetaminophen (NORCO/VICODIN) 5-325 MG tablet Take 1 tablet by mouth every 4 (four) hours as needed. 05/05/16  Yes [place], PA-C  levothyroxine (SYNTHROID, LEVOTHROID) 50 MCG tablet Take 50 mcg by mouth daily before breakfast.   Yes Historical Provider,  MD  methocarbamol (ROBAXIN) 500 MG tablet Take 1 tablet (500 mg total) by mouth 2 (two) times daily. 05/05/16  Yes Olivia Canter Sam, PA-C  montelukast (SINGULAIR) 10 MG tablet Take 10 mg by mouth at bedtime as needed (allergies.).    Yes Historical Provider, MD  Multiple Vitamin (MULTIVITAMIN) capsule Take 1 capsule by mouth daily.   Yes Historical Provider, MD  naproxen (NAPROSYN) 500 MG tablet Take 1 tablet (500 mg total) by mouth 2 (two) times daily. 05/05/16  Yes Olivia Canter Sam, PA-C  Omega-3 Fatty Acids (FISH OIL PO) Take 1 capsule by mouth daily.   Yes Historical Provider, MD    Family History Family History  Problem Relation Age of Onset  . Heart disease Mother   . Hypertension Brother   .  Hypertension Maternal Grandmother   . Heart disease Maternal Grandmother     Social History Social History  Substance Use Topics  . Smoking status: Never Smoker  . Smokeless tobacco: Never Used  . Alcohol use No     Allergies   Sulfa antibiotics   Review of Systems Review of Systems  All other systems reviewed and are negative.    Physical Exam Updated Vital Signs BP 122/66 (BP Location: Left Arm)   Pulse (!) 57   Temp 98.1 F (36.7 C) (Oral)   Resp 20   Ht 5\' 4"  (1.626 m)   Wt 135.2 kg   LMP 05/12/2012   SpO2 97%   BMI 51.15 kg/m   Physical Exam  Constitutional: She is oriented to person, place, and time. She appears well-developed and well-nourished.  HENT:  Head: Normocephalic and atraumatic.  Eyes: Conjunctivae are normal. Pupils are equal, round, and reactive to light. Right eye exhibits no discharge. Left eye exhibits no discharge. No scleral icterus.  Neck: Normal range of motion. No JVD present. No tracheal deviation present.  Pulmonary/Chest: Effort normal. No stridor.  Musculoskeletal:  Right lower extremity tight, swollen, with warmth to touch. Compartments firm but compressible. Pedal pulses 2+, sensation intact.   Neurological: [name] and oriented to person, place, and time. Coordination normal.  Psychiatric: She has a normal mood and affect. Her behavior is normal. Judgment and thought content normal.  Nursing note and vitals reviewed.  Anterior compartment pressure 24, posterior superficial compartment 22   ED Treatments / Results  Labs (all labs ordered are listed, but only abnormal results are displayed) Labs Reviewed  CBC WITH DIFFERENTIAL/PLATELET - Abnormal; Notable for the following:       Result Value   RDW 15.8 (*)    All other components within normal limits  BASIC METABOLIC PANEL - Abnormal; Notable for the following:    BUN 26 (*)    Anion gap 4 (*)    All other components within normal limits  CK - Abnormal; Notable for  the following:    Total CK 1,167 (*)    All other components within normal limits  I-STAT CG4 LACTIC ACID, ED - Abnormal; Notable for the following:    Lactic Acid, Venous 0.43 (*)    All other components within normal limits  CK  I-STAT CG4 LACTIC ACID, ED    EKG  EKG Interpretation None       Radiology Dg Chest 2 View  Result Date: 05/05/2016 CLINICAL DATA:  Weakness, shortness of breath and right leg pain and swelling EXAM: CHEST  2 VIEW COMPARISON:  10/19/2015 FINDINGS: The heart is slightly enlarged. Central pulmonary vascular congestion is present.  No effusion. No focal consolidation. No pneumothorax. IMPRESSION: 1. Mild enlargement of the cardiomediastinal silhouette with central vascular congestion present. 2. No acute consolidation or effusion. Electronically Signed   By: Donavan Foil M.D.   On: 05/05/2016 19:32    Procedures Procedures (including critical care time)  Medications Ordered in ED Medications  lidocaine (XYLOCAINE) 2 % (with pres) injection 400 mg (not administered)  sodium chloride 0.9 % bolus 1,000 mL (not administered)  oxyCODONE (Oxy IR/ROXICODONE) immediate release tablet 10 mg (not administered)  iopamidol (ISOVUE-370) 76 % injection 75 mL (not administered)  fentaNYL (SUBLIMAZE) injection 50 mcg ( Intravenous Given 05/06/16 1618)  sodium chloride 0.9 % bolus 1,000 mL (0 mLs Intravenous Stopped 05/06/16 1957)  iopamidol (ISOVUE-370) 76 % injection 100 mL (100 mLs Intravenous Contrast Given 05/06/16 2019)     Initial Impression / Assessment and Plan / ED Course  I have reviewed the triage vital signs and the nursing notes.  Pertinent labs & imaging results that were available during my care of the patient were reviewed by me and considered in my medical decision making (see chart for details).  Clinical Course     Final Clinical Impressions(s) / ED Diagnoses   Final diagnoses:  Non-traumatic rhabdomyolysis     Labs:  Imaging:  Consults:  Therapeutics:  Discharge Meds:   Assessment/Plan:  60 year old female presents today with rhabdomyolysis. Initial concern for compartment syndrome as patient having severe pain with tight compartments, compartment pressure not within range it is concerning for immediate orthopedic intervention. I consult orthopedics who evaluated the patient. Patient will need hospital admission for trending CK, fluid hydration, pain management. Patient is unable to ambulate even with the assistance of walker and boot.       New Prescriptions New Prescriptions   No medications on file     Okey Regal, PA-C 05/06/16 2030    Isla Pence, MD 05/07/16 2332

## 2016-05-06 NOTE — ED Triage Notes (Signed)
Per EMS-right leg pain that started on Wednesday-was here yesterday for same symptoms-states she cant get around at home

## 2016-05-06 NOTE — H&P (Signed)
History and Physical    Theresa Kirby [id number] DOB: 1956-06-21 DOA: 05/06/2016  PCP: Eli Hose, MD  Patient coming from:  home  Chief Complaint:  Right leg pain for 2 days  HPI: Theresa Kirby is a 60 y.o. female with medical history significant of obesity, osa, asthma comes in for the second time for right leg swelling and pain.  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  There has been no rash.  The leg is painful from the achilles all the way up past the knee posteriorly.  There are no open wounds.  She can move her ankle, knee and hip without pain in the joints but the elicits pain in the whole leg.  She has been sob also and per daughter report her oxgyen sats have been dropping into the mid 80s several times. Also happened yesterday when she was here, she was sent home on hydrocodone tablets which are not helping.  Pt denies chest pain, and no pain ellicited with deep inspiration but she reports sob.  Her oxygen sats right now are 98% on RA.  Pt had an ultrasound of her leg was neg for DVT.  Tissue pressures were noted to be elevated, and orthopedic surgery called and evaluated the patient and were not concerned for compartment syndrome.  Pt cpk is elevated at around 1600.  Pt was given a boot and walker and cannot get up to get around due to pain.  She has been given fentanyl in the ED which only temporarily helps.  Pt referred for admission for continued pain in her leg with unknown etiology.  Review of Systems: As per HPI otherwise 10 point review of systems negative.   Past Medical History:  Diagnosis Date  . Arthritis    knees  . Asthma   . Sleep apnea    uses cpap  . Thyroid disease     Past Surgical History:  Procedure Laterality Date  . CESAREAN SECTION    . COLONOSCOPY WITH PROPOFOL N/A 07/21/2015   Procedure: COLONOSCOPY WITH PROPOFOL;  Surgeon: Juanita Craver, MD;  Location: [facility];  Service: Endoscopy;  Laterality: N/A;  .  DILATION AND CURETTAGE OF UTERUS    . ENDOMETRIAL ABLATION    . GANGLION CYST EXCISION     rt arm  . SHOULDER SURGERY Left    Rotator cuff repair     reports that she has never smoked. She has never used smokeless tobacco. She reports that she does not drink alcohol or use drugs.  Allergies  Allergen Reactions  . Sulfa Antibiotics Hives and Shortness Of Breath    Family History  Problem Relation Age of Onset  . Heart disease Mother   . Hypertension Brother   . Hypertension Maternal Grandmother   . Heart disease Maternal Grandmother     Prior to Admission medications   Medication Sig Start Date End Date Taking? Authorizing Provider  acetaminophen (TYLENOL) 325 MG tablet Take 650 mg by mouth every 6 (six) hours as needed (pain).    Yes Historical Provider, MD  albuterol (PROAIR HFA) 108 (90 BASE) MCG/ACT inhaler Inhale 2 puffs into the lungs every 6 (six) hours as needed for wheezing or shortness of breath.  06/20/14  Yes Historical Provider, MD  diclofenac sodium (VOLTAREN) 1 % GEL Apply 2 g topically 2 (two) times daily. Applies to knees. 06/13/14  Yes Historical Provider, MD  ferrous sulfate 325 (65 FE) MG tablet Take  325 mg by mouth 3 (three) times daily with meals.   Yes Historical Provider, MD  Fluticasone-Salmeterol (ADVAIR) 250-50 MCG/DOSE AEPB Inhale 1 puff into the lungs 2 (two) times daily.   Yes Historical Provider, MD  HYDROcodone-acetaminophen (NORCO/VICODIN) 5-325 MG tablet Take 1 tablet by mouth every 4 (four) hours as needed. 05/05/16  Yes Olivia Canter Sam, PA-C  levothyroxine (SYNTHROID, LEVOTHROID) 50 MCG tablet Take 50 mcg by mouth daily before breakfast.   Yes Historical Provider, MD  methocarbamol (ROBAXIN) 500 MG tablet Take 1 tablet (500 mg total) by mouth 2 (two) times daily. 05/05/16  Yes Olivia Canter Sam, PA-C  montelukast (SINGULAIR) 10 MG tablet Take 10 mg by mouth at bedtime as needed (allergies.).    Yes Historical Provider, MD  Multiple Vitamin (MULTIVITAMIN)  capsule Take 1 capsule by mouth daily.   Yes Historical Provider, MD  naproxen (NAPROSYN) 500 MG tablet Take 1 tablet (500 mg total) by mouth 2 (two) times daily. 05/05/16  Yes Olivia Canter Sam, PA-C  Omega-3 Fatty Acids (FISH OIL PO) Take 1 capsule by mouth daily.   Yes Historical Provider, MD    Physical Exam: Vitals:   05/06/16 1149 05/06/16 1451 05/06/16 1712 05/06/16 1929  BP: 147/84 134/76 122/60 122/66  Pulse: 64 60 (!) 54 (!) 57  Resp:  18 16 20   Temp: 98.2 F (36.8 C)   98.1 F (36.7 C)  TempSrc: Oral   Oral  SpO2: 91% 99% 97% 97%  Weight:  135.2 kg (298 lb)    Height:  5\' 4"  (1.626 m)      Constitutional: NAD, calm, comfortable Vitals:   05/06/16 1149 05/06/16 1451 05/06/16 1712 05/06/16 1929  BP: 147/84 134/76 122/60 122/66  Pulse: 64 60 (!) 54 (!) 57  Resp:  18 16 20   Temp: 98.2 F (36.8 C)   98.1 F (36.7 C)  TempSrc: Oral   Oral  SpO2: 91% 99% 97% 97%  Weight:  135.2 kg (298 lb)    Height:  5\' 4"  (1.626 m)     Eyes: PERRL, lids and conjunctivae normal ENMT: Mucous membranes are moist. Posterior pharynx clear of any exudate or lesions.Normal dentition.  Neck: normal, supple, no masses, no thyromegaly Respiratory: clear to auscultation bilaterally, no wheezing, no crackles. Normal respiratory effort. No accessory muscle use.  Cardiovascular: Regular rate and rhythm, no murmurs / rubs / gallops. No extremity edema. 2+ pedal pulses. No carotid bruits.  Abdomen: no tenderness, no masses palpated. No hepatosplenomegaly. Bowel sounds positive.  Musculoskeletal: no clubbing / cyanosis. No joint deformity upper. Good ROM, no contractures. Normal muscle tone.  rle is swollen and painful to palpation, positive homans.  No redness to leg.  Pulses intact. Skin: no rashes, lesions, ulcers. No induration Neurologic: CN 2-12 grossly intact. Sensation intact, DTR normal. Strength 5/5 in all 4.  Psychiatric: Normal judgment and insight. Alert and oriented x 3. Normal mood.     Labs on Admission: I have personally reviewed following labs and imaging studies  CBC:  Recent Labs Lab 05/05/16 1652 05/06/16 1556  WBC 7.3 5.4  NEUTROABS 5.1 3.5  HGB 13.3 12.8  HCT 41.0 40.2  MCV 82.2 83.8  PLT 215 [id number]   Basic Metabolic Panel:  Recent Labs Lab 05/05/16 1652 05/06/16 1556  NA 141 140  K 3.9 4.1  CL 105 105  CO2 29 31  GLUCOSE 97 92  BUN 22* 26*  CREATININE 0.78 0.78  CALCIUM 9.2 9.1   GFR: Estimated Creatinine Clearance:  103.9 mL/min (by C-G formula based on SCr of 0.78 mg/dL).  Cardiac Enzymes:  Recent Labs Lab 05/05/16 1924 05/06/16 1556  CKTOTAL  --  1,167*  TROPONINI <0.03  --     Urine analysis:    Component Value Date/Time   COLORURINE YELLOW 12/13/2011 [place] 12/13/2011 1403   LABSPEC 1.022 12/13/2011 1403   PHURINE 5.5 12/13/2011 1403   GLUCOSEU NEGATIVE 12/13/2011 1403   HGBUR NEGATIVE 12/13/2011 1403   BILIRUBINUR neg 03/11/2012 0905   KETONESUR NEGATIVE 12/13/2011 1403   PROTEINUR neg 03/11/2012 0905   PROTEINUR NEGATIVE 12/13/2011 1403   UROBILINOGEN 0.2 03/11/2012 0905   UROBILINOGEN 0.2 12/13/2011 1403   NITRITE neg 03/11/2012 0905   NITRITE NEGATIVE 12/13/2011 1403   LEUKOCYTESUR small (1+) 03/11/2012 0905   Radiological Exams on Admission: Dg Chest 2 View  Result Date: 05/05/2016 CLINICAL DATA:  Weakness, shortness of breath and right leg pain and swelling EXAM: CHEST  2 VIEW COMPARISON:  10/19/2015 FINDINGS: The heart is slightly enlarged. Central pulmonary vascular congestion is present. No effusion. No focal consolidation. No pneumothorax. IMPRESSION: 1. Mild enlargement of the cardiomediastinal silhouette with central vascular congestion present. 2. No acute consolidation or effusion. Electronically Signed   By: Donavan Foil M.D.   On: 05/05/2016 19:32     Assessment/Plan 60 yo female with RLE pain out of porportion to findings with associated sob/occasional hypoxia With elevated  cpk, normal cr  Principal Problem:   Leg pain and swelling, right- clinically this seems more consistent with a DVT however her [place] is neg but it was limited due to body habitus.  Ortho is following for compartment issues.  Give oxycodone 10mg  tablets and toradol for pain.    Active Problems:   Obesity- noted   Hypoxia- will proceed with CTA to r/o PE,    SOB (shortness of breath)- will check stat troponin level also, CTA to r/o PE   OSA - noted, pt can use her cpap tonight if availabe    Elevated cpk - given 2 liters of ivf, repeat ck level now   DVT prophylaxis:  ambulate Code Status:   full Family Communication:  Daughter at bedside Disposition Plan:   Per day team Consults called:   ortho Admission status:   observation   Montee Tallman A MD [facility]  If 7PM-7AM, please contact night-coverage [email or web address] Password [email or web address]  05/06/2016, 8:19 PM

## 2016-05-06 NOTE — ED Notes (Signed)
Bed: WA14 Expected date:  Expected time:  Means of arrival:  Comments: 

## 2016-05-06 NOTE — ED Triage Notes (Signed)
Pt states rt leg pain has been so bad this morning she can not walk or place weight on it. Good pulse, - swelling skin warm and dry.

## 2016-05-07 ENCOUNTER — Observation Stay (HOSPITAL_COMMUNITY): Payer: Federal, State, Local not specified - PPO

## 2016-05-07 DIAGNOSIS — M79604 Pain in right leg: Secondary | ICD-10-CM

## 2016-05-07 DIAGNOSIS — T796XXA Traumatic ischemia of muscle, initial encounter: Secondary | ICD-10-CM

## 2016-05-07 DIAGNOSIS — E6609 Other obesity due to excess calories: Secondary | ICD-10-CM | POA: Diagnosis not present

## 2016-05-07 DIAGNOSIS — M7989 Other specified soft tissue disorders: Secondary | ICD-10-CM | POA: Diagnosis not present

## 2016-05-07 LAB — CBC
HEMATOCRIT: 38.8 % (ref 36.0–46.0)
HEMOGLOBIN: 12.1 g/dL (ref 12.0–15.0)
MCH: 26.3 pg (ref 26.0–34.0)
MCHC: 31.2 g/dL (ref 30.0–36.0)
MCV: 84.3 fL (ref 78.0–100.0)
Platelets: 166 10*3/uL (ref 150–400)
RBC: 4.6 MIL/uL (ref 3.87–5.11)
RDW: 16 % — ABNORMAL HIGH (ref 11.5–15.5)
WBC: 5.3 10*3/uL (ref 4.0–10.5)

## 2016-05-07 LAB — TROPONIN I

## 2016-05-07 LAB — RETICULOCYTES
RBC.: 4.61 MIL/uL (ref 3.87–5.11)
RETIC COUNT ABSOLUTE: 69.2 10*3/uL (ref 19.0–186.0)
Retic Ct Pct: 1.5 % (ref 0.4–3.1)

## 2016-05-07 LAB — BASIC METABOLIC PANEL
Anion gap: 5 (ref 5–15)
BUN: 21 mg/dL — AB (ref 6–20)
CO2: 29 mmol/L (ref 22–32)
CREATININE: 0.69 mg/dL (ref 0.44–1.00)
Calcium: 8.8 mg/dL — ABNORMAL LOW (ref 8.9–10.3)
Chloride: 107 mmol/L (ref 101–111)
GFR calc Af Amer: >60 mL/min (ref >60)
GLUCOSE: 113 mg/dL — AB (ref 65–99)
POTASSIUM: 4.4 mmol/L (ref 3.5–5.1)
Sodium: 141 mmol/L (ref 135–145)

## 2016-05-07 LAB — TSH: TSH: 1.847 u[IU]/mL (ref 0.350–4.500)

## 2016-05-07 LAB — FERRITIN: Ferritin: 73 ng/mL (ref 11–307)

## 2016-05-07 LAB — VITAMIN B12: VITAMIN B 12: 592 pg/mL (ref 180–914)

## 2016-05-07 LAB — IRON AND TIBC
IRON: 31 ug/dL (ref 28–170)
Saturation Ratios: 9 % — ABNORMAL LOW (ref 10.4–31.8)
TIBC: 333 ug/dL (ref 250–450)
UIBC: 302 ug/dL

## 2016-05-07 LAB — FOLATE: FOLATE: 35.3 ng/mL (ref >5.9)

## 2016-05-07 NOTE — Progress Notes (Signed)
Patient ID: Theresa Kirby, female   DOB: 05-Jun-1956, 60 y.o.   MRN: [id number] No evidence of right leg compartment syndrome on exam.  Patient reports decreased pain and much improved sensation.  Her compartments are soft and she moves her foot and ankle freely and easily.  Can be up as tolerated from Ortho standpoint.

## 2016-05-07 NOTE — Progress Notes (Signed)
[facility] PROGRESS NOTE    Progress Note  [name]  [id number] DOB: [birth year] DOA: 05/06/2016 PCP: Eli Hose, MD     Brief Narrative:   Theresa Kirby is an 60 y.o. 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 Surgery was consulted for concern of acute compartmental syndrome and they didn't recommend any further evaluation  Assessment/Plan:   Leg pain, right/  Right leg swelling: No DVT see seen on Doppler, CT scan of the chest was negative for PE. Orthopedic surgery was consulted for concern of compartmental syndrome her pressure was less than 30 mmHg. She had a mildly elevated CK at 1000, will continue IV hydration and recheck in the morning. She has a mild RDW with a hemoglobin of 12 normal MCV we'll check an anemia panel question likely due to iron deficiency anemia. CT scan of the chest was done for possible hypoxia, the patient has been off oxygen. CT scan revealed an uncharacterized  lesions. So we'll go ahead and repeated.  Morbid Obesity: Counseling.  Rhabdomyolysis: Likely due to traumatic. Continue IV fluids recheck in the morning.  DVT prophylaxis: lovenox Family Communication:none Disposition Plan/Barrier to D/C: home in am Code Status:     Code Status Orders        Start     Ordered   05/06/16 2306  Full code  Continuous     05/06/16 2305    Code Status History    Date Active Date Inactive Code Status Order ID Comments User Context   This patient has a current code status but no historical code status.        IV Access:    Peripheral IV   Procedures and diagnostic studies:   Dg Chest 2 View  Result Date: 05/05/2016 CLINICAL  DATA:  Weakness, shortness of breath and right leg pain and swelling EXAM: CHEST  2 VIEW COMPARISON:  10/19/2015 FINDINGS: The heart is slightly enlarged. Central pulmonary vascular congestion is present. No effusion. No focal consolidation. No pneumothorax. IMPRESSION: 1. Mild enlargement of the cardiomediastinal silhouette with central vascular congestion present. 2. No acute consolidation or effusion. Electronically Signed   By: Donavan Foil M.D.   On: 05/05/2016 19:32   Ct Angio Chest Pe W And/or Wo Contrast  Result Date: 05/06/2016 CLINICAL DATA:  Acute onset of right lower extremity pain and swelling. Initial encounter. EXAM: CT ANGIOGRAPHY CHEST WITH CONTRAST TECHNIQUE: Multidetector CT imaging of the chest was performed using the standard protocol during bolus administration of intravenous contrast. Multiplanar CT image reconstructions and MIPs were obtained to evaluate the vascular anatomy. CONTRAST:  100 mL of Isovue 370 IV contrast COMPARISON:  Chest radiograph performed 05/05/2016 FINDINGS: Cardiovascular: There is no evidence of significant pulmonary embolus. Evaluation for pulmonary embolus is suboptimal due to motion artifact. The heart is mildly enlarged. The thoracic aorta is unremarkable in appearance. No calcific atherosclerotic disease is seen. The great vessels are within normal limits. Mediastinum/Nodes: Prominent mediastinal nodes are seen, with a 1.7 cm node at the right paratracheal region, and periaortic nodes measuring up to 1.5 cm in size. Prominent bilateral peribronchial nodes are seen, measuring up to 1.3 cm in short axis. No pericardial effusion is identified.  The visualized portions of the thyroid gland are unremarkable. No axillary lymphadenopathy is seen. Lungs/Pleura: Vascular congestion is suggested. Mild bibasilar atelectasis is seen. There is a 1.3 cm nodular density at the right lower lobe (image 49 of 85), which is not well characterized due to motion artifact. No  pleural effusion or pneumothorax is seen. Upper Abdomen: The visualized portions of the liver and spleen are grossly unremarkable. Musculoskeletal: No acute osseous abnormalities are identified. The visualized musculature is unremarkable in appearance. Review of the MIP images confirms the above findings. IMPRESSION: 1. No evidence of significant pulmonary embolus. Evaluation is suboptimal due to motion artifact. 2. Enlarged mediastinal and peribronchial nodes, measuring up to 1.7 cm in short axis. The largest right paratracheal node would be amenable to biopsy, to exclude underlying malignancy. 3. Mild cardiomegaly. 4. **An incidental finding of potential clinical significance has been found. 1.3 cm nodular density at the right lower lung lobe, not well characterized due to motion artifact. Repeat CT of the chest would be helpful for further evaluation, on an elective nonemergent basis, to determine whether this is a real finding, prior to further workup.** 5. Mild bibasilar atelectasis noted.  Vascular congestion suggested. Electronically Signed   By: Garald Balding M.D.   On: 05/06/2016 20:49     Medical Consultants:    None.  Anti-Infectives:   None  Subjective:    [name] she relates her leg pain is much better.  Objective:    Vitals:   05/06/16 1929 05/06/16 1930 05/06/16 2249 05/07/16 0614  BP: 122/66 122/66 (!) 123/59 (!) 119/52  Pulse: (!) 57 (!) 55 60 (!) 56  Resp: 20  20 18   Temp: 98.1 F (36.7 C)  99.1 F (37.3 C) 98.1 F (36.7 C)  TempSrc: Oral  Oral Oral  SpO2: 97% 96% 93% 97%  Weight:   (!) 143.5 kg (316 lb 4.8 oz)   Height:   5\' 4"  (1.626 m)     Intake/Output Summary (Last 24 hours) at 05/07/16 0846 Last data filed at 05/07/16 0742  Gross per 24 hour  Intake           821.67 ml  Output              300 ml  Net           521.67 ml   Filed Weights   05/06/16 1451 05/06/16 2249  Weight: 135.2 kg (298 lb) (!) 143.5 kg (316 lb 4.8 oz)     Exam: General exam: In no acute distress. Respiratory system: Good air movement and clear to auscultation. Cardiovascular system: S1 & S2 heard, RRR. No JVD, murmurs, rubs, gallops or clicks.  Gastrointestinal system: Abdomen is nondistended, soft and nontender.  Central nervous system: Alert and oriented. No focal neurological deficits. Extremities: No pedal edema. Skin: No rashes, lesions or ulcers Psychiatry: Judgement and insight appear normal. Mood & affect appropriate.    Data Reviewed:    Labs: Basic Metabolic Panel:  Recent Labs Lab 05/05/16 1652 05/06/16 1556 05/07/16 0537  NA 141 140 141  K 3.9 4.1 4.4  CL 105 105 107  CO2 29 31 29   GLUCOSE 97 92 113*  BUN 22* 26* 21*  CREATININE 0.78 0.78 0.69  CALCIUM 9.2 9.1 8.8*   GFR Estimated Creatinine Clearance: 107.8 mL/min (by C-G formula based on SCr of 0.69 mg/dL). Liver Function Tests: No results for input(s): AST, ALT, ALKPHOS, BILITOT, PROT, ALBUMIN in the last 168 hours. No results for  input(s): LIPASE, AMYLASE in the last 168 hours. No results for input(s): AMMONIA in the last 168 hours. Coagulation profile No results for input(s): INR, PROTIME in the last 168 hours.  CBC:  Recent Labs Lab 05/05/16 1652 05/06/16 1556 05/07/16 0537  WBC 7.3 5.4 5.3  NEUTROABS 5.1 3.5  --   HGB 13.3 12.8 12.1  HCT 41.0 40.2 38.8  MCV 82.2 83.8 84.3  PLT 215 183 166   Cardiac Enzymes:  Recent Labs Lab 05/05/16 1924 05/06/16 1556 05/06/16 1854 05/06/16 2325 05/07/16 0537  CKTOTAL  --  1,167* 1,077*  --   --   TROPONINI <0.03  --   --  <0.03 <0.03   BNP (last 3 results) No results for input(s): PROBNP in the last 8760 hours. CBG: No results for input(s): GLUCAP in the last 168 hours. D-Dimer: No results for input(s): DDIMER in the last 72 hours. Hgb A1c: No results for input(s): HGBA1C in the last 72 hours. Lipid Profile: No results for input(s): CHOL, HDL, LDLCALC, TRIG, CHOLHDL, LDLDIRECT in the  last 72 hours. Thyroid function studies: No results for input(s): TSH, T4TOTAL, T3FREE, THYROIDAB in the last 72 hours.  Invalid input(s): FREET3 Anemia work up: No results for input(s): VITAMINB12, FOLATE, FERRITIN, TIBC, IRON, RETICCTPCT in the last 72 hours. Sepsis Labs:  Recent Labs Lab 05/05/16 1652 05/06/16 1556 05/06/16 1607 05/06/16 2011 05/07/16 0537  WBC 7.3 5.4  --   --  5.3  LATICACIDVEN  --   --  0.43* 0.65  --    Microbiology No results found for this or any previous visit (from the past 240 hour(s)).   Medications:   . ferrous sulfate  325 mg Oral TID WC  . levothyroxine  50 mcg Oral QAC breakfast  . methocarbamol  500 mg Oral BID  . multivitamin with minerals  1 tablet Oral Daily  . naproxen  500 mg Oral BID  . sodium chloride flush  3 mL Intravenous Q12H  . sodium chloride flush  3 mL Intravenous Q12H   Continuous Infusions: . sodium chloride 100 mL/hr at 05/07/16 0059       LOS: 0 days   [name]  [facility] Pager [phone]  *Please refer to [email or web address], password [email or web address] to get updated schedule on who will round on this patient, as hospitalists switch teams weekly. If 7PM-7AM, please contact night-coverage at [email or web address], password [email or web address] for any overnight needs.  05/07/2016, 8:46 AM

## 2016-05-08 ENCOUNTER — Observation Stay (HOSPITAL_COMMUNITY): Payer: Federal, State, Local not specified - PPO

## 2016-05-08 DIAGNOSIS — E6609 Other obesity due to excess calories: Secondary | ICD-10-CM | POA: Diagnosis not present

## 2016-05-08 DIAGNOSIS — M7989 Other specified soft tissue disorders: Secondary | ICD-10-CM | POA: Diagnosis not present

## 2016-05-08 DIAGNOSIS — M79604 Pain in right leg: Secondary | ICD-10-CM | POA: Diagnosis not present

## 2016-05-08 LAB — CK: CK TOTAL: 878 U/L — AB (ref 38–234)

## 2016-05-08 MED ORDER — HYDROCODONE-ACETAMINOPHEN 5-325 MG PO TABS: 1.0000 | ORAL_TABLET | ORAL | 0 refills | Status: DC | PRN

## 2016-05-08 NOTE — Evaluation (Addendum)
Physical Therapy Evaluation Patient Details Name: Theresa Kirby MRN: [id number] DOB: 14-Jun-1956 Today's Date: 05/08/2016   History of Present Illness  60 yo female admitted with R LE/foot pain, rhabdomyolysis?Manson Passey consulted (-) compartment syndrome. Hx of obesity.  Clinical Impression  On eval, pt requires Mod assist +2 to stand with a RW. Pt is barely able to tolerate any WBing through R LE. Stood x3 however pt was unable to take any steps due to severe pain. At this time, pt is not safe to d/c home. Based on today's performance, recommend ST rehab at [facility]. Will follow and progress activity as able.     Follow Up Recommendations SNF    Equipment Recommendations       Recommendations for Other Services OT consult     Precautions / Restrictions Precautions Precautions: Fall Restrictions Weight Bearing Restrictions: No      Mobility  Bed Mobility Overal bed mobility: Needs Assistance Bed Mobility: Supine to Sit;Sit to Supine     Supine to sit: Min guard Sit to supine: Min guard   General bed mobility comments: close guard for safety  Transfers Overall transfer level: Needs assistance Equipment used: Rolling walker (2 wheeled) Transfers: Sit to/from Stand Sit to Stand: From elevated surface;Mod assist;+2 safety/equipment;+2 physical assistance         General transfer comment: x3. On each attempt, pt would not place R foot on floor due to pain. She was able to stand for ~15 seconds each time.   Ambulation/Gait             General Gait Details: Pt was unable to any steps due to pain  Stairs            Wheelchair Mobility    Modified Rankin (Stroke Patients Only)       Balance Overall balance assessment: Needs assistance           Standing balance-Leahy Scale: Poor                               Pertinent Vitals/Pain Pain Assessment: 0-10 Pain Score: 9  Pain Location: R foot Pain Descriptors / Indicators:  Tender;Sore;Guarding;Grimacing Pain Intervention(s): Limited activity within patient's tolerance;Repositioned    Home Living Family/patient expects to be discharged to:: Unsure Living Arrangements: Alone   Type of Home: Apartment Home Access: Level entry     Home Layout: One level Home Equipment: Walker - 2 wheels      Prior Function Level of Independence: Independent         Comments: works as Development worker, community carrier.      Hand Dominance        Extremity/Trunk Assessment   Upper Extremity Assessment: Overall WFL for tasks assessed           Lower Extremity Assessment: RLE deficits/detail RLE Deficits / Details: some swelling noted. Foot tender to touch.     Cervical / Trunk Assessment: Normal  Communication   Communication: No difficulties  Cognition Arousal/Alertness: Awake/alert Behavior During Therapy: WFL for tasks assessed/performed Overall Cognitive Status: Within Functional Limits for tasks assessed                      General Comments      Exercises     Assessment/Plan    PT Assessment Patient needs continued PT services  PT Problem List Decreased mobility;Decreased range of motion;Decreased activity tolerance;Decreased balance;Pain;Decreased knowledge of use of DME;Obesity  PT Treatment Interventions DME instruction;Gait training;Therapeutic activities;Therapeutic exercise;Functional mobility training;Balance training;Patient/family education    PT Goals (Current goals can be found in the Care Plan section)  Acute Rehab PT Goals Patient Stated Goal: less pain.  PT Goal Formulation: With patient/family Time For Goal Achievement: 05/22/16 Potential to Achieve Goals: Good    Frequency Min 3X/week   Barriers to discharge        Co-evaluation               End of Session Equipment Utilized During Treatment: Gait belt Activity Tolerance: Patient limited by pain Patient left: in bed;with call bell/phone within  reach;with family/visitor present      Functional Assessment Tool Used: clinical judgement Functional Limitation: Mobility: Walking and moving around Mobility: Walking and Moving Around Current Status [id number]): At least 40 percent but less than 60 percent impaired, limited or restricted Mobility: Walking and Moving Around Goal Status [phone]): At least 20 percent but less than 40 percent impaired, limited or restricted    Time: 1353-1404 PT Time Calculation (min) (ACUTE ONLY): 11 min   Charges:   PT Evaluation $PT Eval Low Complexity: 1 Procedure     PT G Codes:   PT G-Codes **NOT FOR INPATIENT CLASS** Functional Assessment Tool Used: clinical judgement Functional Limitation: Mobility: Walking and moving around Mobility: Walking and Moving Around Current Status [id number]): At least 40 percent but less than 60 percent impaired, limited or restricted Mobility: Walking and Moving Around Goal Status [phone]): At least 20 percent but less than 40 percent impaired, limited or restricted    Weston Anna, MPT Pager: [phone]

## 2016-05-08 NOTE — Discharge Summary (Signed)
Physician Discharge Summary  Theresa Kirby [id number] DOB: 09-25-1955 DOA: 05/06/2016  PCP: Eli Hose, MD  Admit date: 05/06/2016 Discharge date: 05/08/2016  Admitted From: home Disposition:  Home  Recommendations for Outpatient Follow-up:  1. Follow up with PCP in 1-2 weeks, CT scan of the chest in 3-6 months to follow-up on lung nodule. 2. Please obtain BMP/CBC in one week 3. Please follow up on the following pending results.  Home Health: Yes Equipment/Devices:None  Discharge Condition:stable CODE STATUS:full Diet recommendation: Heart Healthy  Brief/Interim Summary: 60 year old female with past medical history significant for obesity, obstructive sleep apnea and asthma that comes in for the second time for right leg swelling and pain. She works for [employer] and she relates-year-old up her pants up to her knee and really tight today was really hot and she would like that throughout the day. She relates up oriented today her right calf is tender with walking.  Discharge Diagnoses:  Leg pain, right/  Right leg swelling: No DVT see seen on Doppler, CT scan of the chest was negative for PE. Orthopedic surgery was consulted for concern of compartmental syndrome her pressure was less than 30 mmHg. She had a mildly elevated CK at 1000, which resolved with IV hydration. CT scan of the chest showed a small nodule which will have to be followed up as an outpatient with repeat a CT scan in 3-6 months she is a nonsmoker.  Morbid Obesity: Counseling.  Iron deficiency anemia: Source of her sulfate 3 times a day to continue as an outpatient. We'll also start her on MiraLAX daily. Chronic constipation Yesterday.  Discharge Instructions  Discharge Instructions    Diet - low sodium heart healthy    Complete by:  As directed    Increase activity slowly    Complete by:  As directed        Medication List    TAKE these medications   acetaminophen 325 MG tablet Commonly  known as:  TYLENOL Take 650 mg by mouth every 6 (six) hours as needed (pain).   ferrous sulfate 325 (65 FE) MG tablet Take 325 mg by mouth 3 (three) times daily with meals.   FISH OIL PO Take 1 capsule by mouth daily.   Fluticasone-Salmeterol 250-50 MCG/DOSE Aepb Commonly known as:  ADVAIR Inhale 1 puff into the lungs 2 (two) times daily.   HYDROcodone-acetaminophen 5-325 MG tablet Commonly known as:  NORCO/VICODIN Take 1-2 tablets by mouth every 4 (four) hours as needed. What changed:  how much to take   levothyroxine 50 MCG tablet Commonly known as:  SYNTHROID, LEVOTHROID Take 50 mcg by mouth daily before breakfast.   methocarbamol 500 MG tablet Commonly known as:  ROBAXIN Take 1 tablet (500 mg total) by mouth 2 (two) times daily.   montelukast 10 MG tablet Commonly known as:  SINGULAIR Take 10 mg by mouth at bedtime as needed (allergies.).   multivitamin capsule Take 1 capsule by mouth daily.   naproxen 500 MG tablet Commonly known as:  NAPROSYN Take 1 tablet (500 mg total) by mouth 2 (two) times daily.   PROAIR HFA 108 (90 Base) MCG/ACT inhaler Generic drug:  albuterol Inhale 2 puffs into the lungs every 6 (six) hours as needed for wheezing or shortness of breath.   VOLTAREN 1 % Gel Generic drug:  diclofenac sodium Apply 2 g topically 2 (two) times daily. Applies to knees.       Allergies  Allergen Reactions  . Sulfa Antibiotics Hives and  Shortness Of Breath    Consultations:  None   Procedures/Studies: Dg Chest 2 View  Result Date: 05/05/2016 CLINICAL DATA:  Weakness, shortness of breath and right leg pain and swelling EXAM: CHEST  2 VIEW COMPARISON:  10/19/2015 FINDINGS: The heart is slightly enlarged. Central pulmonary vascular congestion is present. No effusion. No focal consolidation. No pneumothorax. IMPRESSION: 1. Mild enlargement of the cardiomediastinal silhouette with central vascular congestion present. 2. No acute consolidation or  effusion. Electronically Signed   By: Donavan Foil M.D.   On: 05/05/2016 19:32   Ct Chest Wo Contrast  Result Date: 05/07/2016 CLINICAL DATA:  Painful lower extremity, hypoxia, shortness of breath. EXAM: CT CHEST WITHOUT CONTRAST TECHNIQUE: Multidetector CT imaging of the chest was performed following the standard protocol without IV contrast. COMPARISON:  05/06/2016. FINDINGS: Cardiovascular: Atherosclerotic calcification of the arterial vasculature, including coronary arteries. Pulmonary arteries and heart are enlarged. No pericardial effusion. Mediastinum/Nodes: Mediastinal lymph nodes measure up to 1.4 cm in the prevascular space, as before. Hilar regions are difficult to definitively evaluate without IV contrast. No axillary adenopathy. Esophagus is grossly unremarkable. Lungs/Pleura: Image quality is degraded by body habitus and respiratory motion. 1.4 cm nodule is seen in the right lower lobe. Lungs are otherwise grossly clear. No pleural fluid. Airway is unremarkable. Upper Abdomen: Image quality is degraded by motion and body habitus with photon starvation artifact. Visualized portions of the liver, gallbladder, adrenal gland, kidneys, spleen, pancreas, stomach and bowel are grossly unremarkable. Musculoskeletal: No worrisome lytic or sclerotic lesions. Degenerative changes are seen in the spine. IMPRESSION: 1. Image quality is rather degraded by body habitus and respiratory motion. 2. 1.4 cm right lower lobe nodule. Consider one of the following in 3 months for both low-risk and high-risk individuals: (a) repeat chest CT, (b) follow-up PET-CT, or (c) tissue sampling. This recommendation follows the consensus statement: Guidelines for Management of Incidental Pulmonary Nodules Detected on CT Images: From the Fleischner Society 2017; Radiology 2017; 284:228-243. 3.  Aortic atherosclerosis (ICD10-170.0). 4. Enlarged pulmonary arteries, indicative of pulmonary arterial hypertension. Electronically Signed    By: Lorin Picket M.D.   On: 05/07/2016 12:03   Ct Angio Chest Pe W And/or Wo Contrast  Result Date: 05/06/2016 CLINICAL DATA:  Acute onset of right lower extremity pain and swelling. Initial encounter. EXAM: CT ANGIOGRAPHY CHEST WITH CONTRAST TECHNIQUE: Multidetector CT imaging of the chest was performed using the standard protocol during bolus administration of intravenous contrast. Multiplanar CT image reconstructions and MIPs were obtained to evaluate the vascular anatomy. CONTRAST:  100 mL of Isovue 370 IV contrast COMPARISON:  Chest radiograph performed 05/05/2016 FINDINGS: Cardiovascular: There is no evidence of significant pulmonary embolus. Evaluation for pulmonary embolus is suboptimal due to motion artifact. The heart is mildly enlarged. The thoracic aorta is unremarkable in appearance. No calcific atherosclerotic disease is seen. The great vessels are within normal limits. Mediastinum/Nodes: Prominent mediastinal nodes are seen, with a 1.7 cm node at the right paratracheal region, and periaortic nodes measuring up to 1.5 cm in size. Prominent bilateral peribronchial nodes are seen, measuring up to 1.3 cm in short axis. No pericardial effusion is identified. The visualized portions of the thyroid gland are unremarkable. No axillary lymphadenopathy is seen. Lungs/Pleura: Vascular congestion is suggested. Mild bibasilar atelectasis is seen. There is a 1.3 cm nodular density at the right lower lobe (image 49 of 85), which is not well characterized due to motion artifact. No pleural effusion or pneumothorax is seen. Upper Abdomen: The visualized portions of  the liver and spleen are grossly unremarkable. Musculoskeletal: No acute osseous abnormalities are identified. The visualized musculature is unremarkable in appearance. Review of the MIP images confirms the above findings. IMPRESSION: 1. No evidence of significant pulmonary embolus. Evaluation is suboptimal due to motion artifact. 2. Enlarged  mediastinal and peribronchial nodes, measuring up to 1.7 cm in short axis. The largest right paratracheal node would be amenable to biopsy, to exclude underlying malignancy. 3. Mild cardiomegaly. 4. **An incidental finding of potential clinical significance has been found. 1.3 cm nodular density at the right lower lung lobe, not well characterized due to motion artifact. Repeat CT of the chest would be helpful for further evaluation, on an elective nonemergent basis, to determine whether this is a real finding, prior to further workup.** 5. Mild bibasilar atelectasis noted.  Vascular congestion suggested. Electronically Signed   By: Garald Balding M.D.   On: 05/06/2016 20:49      Subjective: No compalins, she feels nervous about going home  Discharge Exam: Vitals:   05/07/16 2121 05/08/16 0516  BP: (!) 103/56 (!) 152/76  Pulse: (!) 53 65  Resp: 16 20  Temp: 97.7 F (36.5 C) 98 F (36.7 C)   Vitals:   05/07/16 1200 05/07/16 1325 05/07/16 2121 05/08/16 0516  BP:  133/76 (!) 103/56 (!) 152/76  Pulse:  62 (!) 53 65  Resp:  16 16 20   Temp:  98.2 F (36.8 C) 97.7 F (36.5 C) 98 F (36.7 C)  TempSrc:  Oral Oral Oral  SpO2: (!) 88% 96% 95% 96%  Weight:      Height:        General: Pt is alert, awake, not in acute distress Cardiovascular: RRR, S1/S2 +, no rubs, no gallops Respiratory: CTA bilaterally, no wheezing, no rhonchi Abdominal: Soft, NT, ND, bowel sounds + Extremities: no edema, no cyanosis    The results of significant diagnostics from this hospitalization (including imaging, microbiology, ancillary and laboratory) are listed below for reference.     Microbiology: No results found for this or any previous visit (from the past 240 hour(s)).   Labs: BNP (last 3 results) No results for input(s): BNP in the last 8760 hours. Basic Metabolic Panel:  Recent Labs Lab 05/05/16 1652 05/06/16 1556 05/07/16 0537  NA 141 140 141  K 3.9 4.1 4.4  CL 105 105 107  CO2 29 31  29   GLUCOSE 97 92 113*  BUN 22* 26* 21*  CREATININE 0.78 0.78 0.69  CALCIUM 9.2 9.1 8.8*   Liver Function Tests: No results for input(s): AST, ALT, ALKPHOS, BILITOT, PROT, ALBUMIN in the last 168 hours. No results for input(s): LIPASE, AMYLASE in the last 168 hours. No results for input(s): AMMONIA in the last 168 hours. CBC:  Recent Labs Lab 05/05/16 1652 05/06/16 1556 05/07/16 0537  WBC 7.3 5.4 5.3  NEUTROABS 5.1 3.5  --   HGB 13.3 12.8 12.1  HCT 41.0 40.2 38.8  MCV 82.2 83.8 84.3  PLT 215 183 166   Cardiac Enzymes:  Recent Labs Lab 05/05/16 1924 05/06/16 1556 05/06/16 1854 05/06/16 2325 05/07/16 0537 05/08/16 0524  CKTOTAL  --  1,167* 1,077*  --   --  878*  TROPONINI <0.03  --   --  <0.03 <0.03  --    BNP: Invalid input(s): POCBNP CBG: No results for input(s): GLUCAP in the last 168 hours. D-Dimer No results for input(s): DDIMER in the last 72 hours. Hgb A1c No results for input(s): HGBA1C in the last  72 hours. Lipid Profile No results for input(s): CHOL, HDL, LDLCALC, TRIG, CHOLHDL, LDLDIRECT in the last 72 hours. Thyroid function studies  Recent Labs  05/07/16 1442  TSH 1.847   Anemia work up  Recent Labs  05/07/16 0537 05/07/16 1034  VITAMINB12  --  592  FOLATE  --  35.3  FERRITIN  --  73  TIBC  --  333  IRON  --  31  RETICCTPCT 1.5  --    Urinalysis    Component Value Date/Time   COLORURINE YELLOW 12/13/2011 [place] 12/13/2011 1403   LABSPEC 1.022 12/13/2011 1403   PHURINE 5.5 12/13/2011 1403   GLUCOSEU NEGATIVE 12/13/2011 1403   [place] 12/13/2011 1403   BILIRUBINUR neg 03/11/2012 0905   KETONESUR NEGATIVE 12/13/2011 1403   PROTEINUR neg 03/11/2012 0905   PROTEINUR NEGATIVE 12/13/2011 1403   UROBILINOGEN 0.2 03/11/2012 0905   UROBILINOGEN 0.2 12/13/2011 1403   NITRITE neg 03/11/2012 0905   NITRITE NEGATIVE 12/13/2011 1403   LEUKOCYTESUR small (1+) 03/11/2012 0905   Sepsis Labs Invalid input(s):  PROCALCITONIN,  WBC,  LACTICIDVEN Microbiology No results found for this or any previous visit (from the past 240 hour(s)).   Time coordinating discharge: Over 30 minutes  SIGNED:   [name], MD  [facility] 05/08/2016, 11:06 AM Pager   If 7PM-7AM, please contact night-coverage [email or web address] Password [email or web address]

## 2016-05-08 NOTE — Progress Notes (Signed)
PT Cancellation Note  Patient Details Name: Theresa Kirby MRN: [id number] DOB: 04-Dec-1955   Cancelled Treatment:    Reason Eval/Treat Not Completed: Pain limiting ability to participate. Attempted PT eval-pt politely requested PT check back later. She stated she requested pain meds ~30 minutes ago. Pt stated she needs pain meds before working with PT. Will check back as schedule allows. Thanks.    Weston Anna, MPT Pager: [phone]

## 2016-05-08 NOTE — Discharge Instructions (Signed)
Theresa Kirby was admitted to the Hospital on 05/06/2016 and Discharged on Discharge Date 05/08/2016 and should be excused from work/school   for 10   days starting 05/06/2016 , may return to work/school without any restrictions.  Call Bess Harvest MD, [facility] [phone] with questions.  [name] M.D on 05/08/2016,at 11:06 AM  [facility] Office  [phone]

## 2016-05-09 DIAGNOSIS — M79604 Pain in right leg: Secondary | ICD-10-CM | POA: Diagnosis not present

## 2016-05-09 DIAGNOSIS — M7989 Other specified soft tissue disorders: Secondary | ICD-10-CM | POA: Diagnosis not present

## 2016-05-09 MED ORDER — POLYETHYLENE GLYCOL 3350 17 G PO PACK: 17.0000 g | PACK | Freq: Two times a day (BID) | ORAL | 0 refills | Status: DC

## 2016-05-09 MED ORDER — POLYETHYLENE GLYCOL 3350 17 G PO PACK
17.0000 g | PACK | Freq: Two times a day (BID) | ORAL | Status: DC
  Administered 2016-05-09: 17 g via ORAL
  Filled 2016-05-09: qty 1

## 2016-05-09 MED ORDER — ALBUTEROL SULFATE HFA 108 (90 BASE) MCG/ACT IN AERS: 2.0000 | INHALATION_SPRAY | Freq: Four times a day (QID) | RESPIRATORY_TRACT | Status: DC | PRN

## 2016-05-09 MED ORDER — MOMETASONE FURO-FORMOTEROL FUM 200-5 MCG/ACT IN AERO
2.0000 | INHALATION_SPRAY | Freq: Two times a day (BID) | RESPIRATORY_TRACT | Status: DC
  Filled 2016-05-09: qty 8.8

## 2016-05-09 NOTE — Progress Notes (Signed)
Pt declined Home Health at present time. Pt states that she will go to her PCP to let him order PT for her.

## 2016-05-09 NOTE — Clinical Social Work Note (Signed)
Patient discharged on 10/15. Per RNCM, patient will likely return home today, 10/16 with home health.   Barriers to SNF placement: insurance auth, SNF's will not accept Letter of Guarantee. SNF will be private pay if desired by patient.  Medical Social Worker will sign off for now as social work intervention is no longer needed. Please consult [place] again if new need arises.  Glendon Axe, MSW [phone] 05/09/2016 10:22 AM

## 2016-05-09 NOTE — Progress Notes (Signed)
Pt selected [place] for [facility] and [place].  Referral called to in house rep.

## 2016-05-09 NOTE — Progress Notes (Signed)
[facility] PROGRESS NOTE    Progress Note  Theresa Kirby  [id number] DOB: 1956-06-06 DOA: 05/06/2016 PCP: Eli Hose, MD     Brief Narrative:   Theresa Kirby is an 60 y.o. 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 Surgery was consulted for concern of acute compartmental syndrome and they didn't recommend any further evaluation  Assessment/Plan:   Leg pain, right/  Right leg swelling: No DVT see seen on Doppler, CT scan of the chest was negative for PE. Awaiting physical therapy reevaluation as yesterday she can walk and we were concerned that she might need a skilled nursing facility as she is alone at home and cannot get assistance. But today she was able to walk to the bathroom without any difficulties. Seems like her pain is better controlled.  Morbid Obesity: Counseling.  Rhabdomyolysis: Likely due to traumatic. Resolved.  DVT prophylaxis: lovenox Family Communication:none Disposition Plan/Barrier to D/C: home in am Code Status:     Code Status Orders        Start     Ordered   05/06/16 2306  Full code  Continuous     05/06/16 2305    Code Status History    Date Active Date Inactive Code Status Order ID Comments User Context   This patient has a current code status but no historical code status.        IV Access:    Peripheral IV   Procedures and diagnostic studies:   Dg Knee 1-2 Views Right  Result Date: 05/08/2016 CLINICAL DATA:  60 year old female with a history of popliteal pain for 4 days EXAM: RIGHT KNEE - 1-2 VIEW COMPARISON:  08/10/2012 FINDINGS: No acute fracture identified. No joint effusion. No radiopaque foreign body. Joint space  narrowing of the medial lateral compartment with marginal osteophyte formation. Compared to prior plain film there is significant progression of joint space narrowing. Patellofemoral degenerative changes. Likely intra-articular loose bodies. IMPRESSION: Negative for acute bony abnormality. Advanced tricompartmental osteoarthritis. Signed, Dulcy Fanny. Earleen Newport, DO [facility] Radiology Electronically Signed   By: Corrie Mckusick D.O.   On: 05/08/2016 15:53   Ct Chest Wo Contrast  Result Date: 05/07/2016 CLINICAL DATA:  Painful lower extremity, hypoxia, shortness of breath. EXAM: CT CHEST WITHOUT CONTRAST TECHNIQUE: Multidetector CT imaging of the chest was performed following the standard protocol without IV contrast. COMPARISON:  05/06/2016. FINDINGS: Cardiovascular: Atherosclerotic calcification of the arterial vasculature, including coronary arteries. Pulmonary arteries and heart are enlarged. No pericardial effusion. Mediastinum/Nodes: Mediastinal lymph nodes measure up to 1.4 cm in the prevascular space, as before. Hilar regions are difficult to definitively evaluate without IV contrast. No axillary adenopathy. Esophagus is grossly unremarkable. Lungs/Pleura: Image quality is degraded by body habitus and respiratory motion. 1.4 cm nodule is seen in the right lower lobe. Lungs are otherwise grossly clear. No pleural fluid. Airway is unremarkable. Upper Abdomen: Image quality is degraded by motion and body habitus with photon starvation artifact. Visualized portions of the liver, gallbladder, adrenal gland, kidneys, spleen, pancreas, stomach and bowel are grossly unremarkable. Musculoskeletal: No worrisome lytic or sclerotic lesions. Degenerative changes are seen in the spine. IMPRESSION: 1. Image quality is rather degraded by  body habitus and respiratory motion. 2. 1.4 cm right lower lobe nodule. Consider one of the following in 3 months for both low-risk and  high-risk individuals: (a) repeat chest CT, (b) follow-up PET-CT, or (c) tissue sampling. This recommendation follows the consensus statement: Guidelines for Management of Incidental Pulmonary Nodules Detected on CT Images: From the Fleischner Society 2017; Radiology 2017; 284:228-243. 3.  Aortic atherosclerosis (ICD10-170.0). 4. Enlarged pulmonary arteries, indicative of pulmonary arterial hypertension. Electronically Signed   By: Lorin Picket M.D.   On: 05/07/2016 12:03     Medical Consultants:    None.  Anti-Infectives:   None  Subjective:    Theresa Kirby pain is much better today.  Objective:    Vitals:   05/08/16 0516 05/08/16 2109 05/08/16 2119 05/09/16 0550  BP: (!) 152/76 127/63 90/63 (!) 165/90  Pulse: 65 66 75 69  Resp: 20 16 18 18   Temp: 98 F (36.7 C) 98.4 F (36.9 C) 97.6 F (36.4 C) 98.4 F (36.9 C)  TempSrc: Oral Oral Oral Oral  SpO2: 96% 95% 98% 94%  Weight:      Height:        Intake/Output Summary (Last 24 hours) at 05/09/16 1017 Last data filed at 05/09/16 0551  Gross per 24 hour  Intake                0 ml  Output             2400 ml  Net            -2400 ml   Filed Weights   05/06/16 1451 05/06/16 2249  Weight: 135.2 kg (298 lb) (!) 143.5 kg (316 lb 4.8 oz)    Exam: General exam: In no acute distress. Respiratory system: Good air movement and clear to auscultation. Cardiovascular system: S1 & S2 heard, RRR. No JVD, murmurs, rubs, gallops or clicks.  Gastrointestinal system: Abdomen is nondistended, soft and nontender.  Central nervous system: Alert and oriented. No focal neurological deficits. Extremities: No pedal edema. Skin: No rashes, lesions or ulcers Psychiatry: Judgement and insight appear normal. Mood & affect appropriate.    Data Reviewed:    Labs: Basic Metabolic Panel:  Recent Labs Lab 05/05/16 1652 05/06/16 1556 05/07/16 0537  NA 141 140 141  K 3.9 4.1 4.4  CL 105 105 107  CO2 29 31 29   GLUCOSE 97 92  113*  BUN 22* 26* 21*  CREATININE 0.78 0.78 0.69  CALCIUM 9.2 9.1 8.8*   GFR Estimated Creatinine Clearance: 107.8 mL/min (by C-G formula based on SCr of 0.69 mg/dL). Liver Function Tests: No results for input(s): AST, ALT, ALKPHOS, BILITOT, PROT, ALBUMIN in the last 168 hours. No results for input(s): LIPASE, AMYLASE in the last 168 hours. No results for input(s): AMMONIA in the last 168 hours. Coagulation profile No results for input(s): INR, PROTIME in the last 168 hours.  CBC:  Recent Labs Lab 05/05/16 1652 05/06/16 1556 05/07/16 0537  WBC 7.3 5.4 5.3  NEUTROABS 5.1 3.5  --   HGB 13.3 12.8 12.1  HCT 41.0 40.2 38.8  MCV 82.2 83.8 84.3  PLT 215 183 166   Cardiac Enzymes:  Recent Labs Lab 05/05/16 1924 05/06/16 1556 05/06/16 1854 05/06/16 2325 05/07/16 0537 05/08/16 0524  CKTOTAL  --  1,167* 1,077*  --   --  878*  TROPONINI <0.03  --   --  <0.03 <0.03  --    BNP (last 3 results) No results for input(s): PROBNP  in the last 8760 hours. CBG: No results for input(s): GLUCAP in the last 168 hours. D-Dimer: No results for input(s): DDIMER in the last 72 hours. Hgb A1c: No results for input(s): HGBA1C in the last 72 hours. Lipid Profile: No results for input(s): CHOL, HDL, LDLCALC, TRIG, CHOLHDL, LDLDIRECT in the last 72 hours. Thyroid function studies:  Recent Labs  05/07/16 1442  TSH 1.847   Anemia work up:  Recent Labs  05/07/16 0537 05/07/16 1034  VITAMINB12  --  592  FOLATE  --  35.3  FERRITIN  --  73  TIBC  --  333  IRON  --  31  RETICCTPCT 1.5  --    Sepsis Labs:  Recent Labs Lab 05/05/16 1652 05/06/16 1556 05/06/16 1607 05/06/16 2011 05/07/16 0537  WBC 7.3 5.4  --   --  5.3  LATICACIDVEN  --   --  0.43* 0.65  --    Microbiology No results found for this or any previous visit (from the past 240 hour(s)).   Medications:   . ferrous sulfate  325 mg Oral TID WC  . levothyroxine  50 mcg Oral QAC breakfast  . methocarbamol  500 mg  Oral BID  . mometasone-formoterol  2 puff Inhalation BID  . multivitamin with minerals  1 tablet Oral Daily  . naproxen  500 mg Oral BID  . polyethylene glycol  17 g Oral BID  . sodium chloride flush  3 mL Intravenous Q12H   Continuous Infusions:       LOS: 0 days   [name]  [facility] Pager [phone]  *Please refer to [email or web address], password [email or web address] to get updated schedule on who will round on this patient, as hospitalists switch teams weekly. If 7PM-7AM, please contact night-coverage at [email or web address], password [email or web address] for any overnight needs.  05/09/2016, 10:17 AM

## 2016-05-09 NOTE — Progress Notes (Signed)
PT Cancellation Note  Patient Details Name: Theresa Kirby MRN: [id number] DOB: 27-Aug-1955   Cancelled Treatment:    Reason Eval/Treat Not Completed: Returned for tx session on today. RN requested PT reassess pt-reported pt was doing better today. Spoke with pt who reported she was able to walk to bathroom with NT this morning. Pt poliltely declined participation with PT. She stated she would rather not. She reports her foot still hurts and she only wants to get up when she has to. She also states she is going home today.  Noted pt declined HHPT follow up as well. Encouraged pt to use RW until pain subsides/as needed. Will sign off. Thanks.    Weston Anna, MPT Pager: [phone]

## 2016-05-10 NOTE — Progress Notes (Signed)
[facility] declined to take pt. Spoke with pt's daughter concerning [place] and she selected [facility]. Referral was given to in house rep with [facility].

## 2016-05-30 ENCOUNTER — Ambulatory Visit (INDEPENDENT_AMBULATORY_CARE_PROVIDER_SITE_OTHER): Admitting: Family Medicine

## 2016-05-30 VITALS — BP 126/82 | HR 58 | Temp 97.9°F | Resp 17 | Ht 64.0 in | Wt 297.0 lb

## 2016-05-30 DIAGNOSIS — M6282 Rhabdomyolysis: Secondary | ICD-10-CM

## 2016-05-30 DIAGNOSIS — Z9989 Dependence on other enabling machines and devices: Secondary | ICD-10-CM

## 2016-05-30 DIAGNOSIS — M25871 Other specified joint disorders, right ankle and foot: Secondary | ICD-10-CM | POA: Diagnosis not present

## 2016-05-30 NOTE — Patient Instructions (Addendum)
IF you received an x-ray today, you will receive an invoice from [facility]. Please contact [facility] at [phone] with questions or concerns regarding your invoice.   IF you received labwork today, you will receive an invoice from [employer]. Please contact [facility] at [phone] with questions or concerns regarding your invoice.   Our billing staff will not be able to assist you with questions regarding bills from these companies.  You will be contacted with the lab results as soon as they are available. The fastest way to get your results is to activate your My Chart account. Instructions are located on the last page of this paperwork. If you have not heard from [place] regarding the results in 2 weeks, please contact this office.     Electromyoneurogram is a test to check how well your muscles and nerves are working. This procedure includes the combined use of electromyogram (EMG) and nerve conduction study (NCS). EMG is used to look for muscular disorders. NCS, which is also called electroneurogram, measures how well your nerves are controlling your muscles. The procedures are usually performed together to check if your muscles and nerves are healthy. If the reaction to testing is abnormal, this can indicate disease or injury, such as peripheral nerve damage. LET [facility] CARE PROVIDER KNOW ABOUT:  Any allergies you have.  All medicines you are taking, including vitamins, herbs, eye drops, creams, and over-the-counter medicines.  Previous problems you or members of your family have had with the use of anesthetics.  Any blood disorders you have.  Previous surgeries you have had.  Any medical conditions you have.  Any pacemaker you have. RISKS AND COMPLICATIONS Generally, this is a safe procedure. However, problems may occur, including:  Infection where the electrodes were inserted.  Bleeding. BEFORE THE PROCEDURE  Ask  your health care provider about:  Changing or stopping your regular medicines. This is especially important if you are taking diabetes medicines or blood thinners.  Taking medicines such as aspirin and ibuprofen. These medicines can thin your blood. Do not take these medicines before your procedure if your health care provider instructs you not to.  Your health care provider may ask you to avoid:  Caffeine, such as coffee and tea.  Nicotine. This includes cigarettes and anything with tobacco.  Do not use lotions or creams on the same day that you will be having the procedure. PROCEDURE For EMG:  Your health care provider will ask you to stay in a position so that he or she can access the muscle that will be studied. You may be standing, sitting down, or lying down.  You may be given a medicine that numbs the area (local anesthetic).  A very thin needle that has an electrode on it will be inserted into your muscle.  Another small electrode will be placed on your skin near the muscle.  Your health care provider will ask you to continue to remain still.  The electrodes will send a signal that tells about the electrical activity of your muscles. You may see this on a monitor or hear it in the room.  After your muscles have been studied at rest, your health care provider will ask you to contract or flex your muscles. The electrodes will send a signal that tells about the electrical activity of your muscles.  Your health care provider will remove the electrodes and the electrode needles when the procedure is finished. The procedure may vary among  health care providers and hospitals. For NCS:  An electrode that records your nerve activity (recording electrode) will be placed on your skin by the muscle that is being studied.  An electrode that is used as a reference (reference electrode) will be placed near the recording electrode.  A paste or gel will be applied to your skin between  the recording electrode and the reference electrode.  Your nerve will be stimulated with a mild shock. Your health care provider will measure how much time it takes for your muscle to react.  Your health care provider will remove the electrodes and the gel when the procedure is finished. The procedure may vary among health care providers and hospitals. AFTER THE PROCEDURE  It is your responsibility to obtain your test results. Ask your health care provider or the department performing the test when and how you will get your results.  Your health care provider may:  Give you medicines for any pain.  Monitor the insertion sites to make sure that they stop bleeding.   This information is not intended to replace advice given to you by your health care provider. Make sure you discuss any questions you have with your health care provider.   Document Released: 11/11/2004 Document Revised: 11/25/2014 Document Reviewed: 09/01/2014 Elsevier Interactive Patient Education [employer].

## 2016-05-30 NOTE — Progress Notes (Signed)
Theresa Kirby 1955/12/17 60 y.o.   Chief Complaint  Patient presents with  . Leg Pain    Compartment syndrome.      Date of Injury: 05/04/16  History of Present Illness:  Presents for evaluation of work-related complaint. She reports that she was injured when she rolling her pants up to her right knee and was getting pain and heat especially in the calf.  She reports that when she rolled the pant leg down she had severe pain.  She was evaluated in the ER and was admitted.  She had evaluation to rule out DVT and was admitted with Rhabdomyolysis (non-traumatic).  She reports that the pain in her leg is getting better but she cannot stand independently and is using a walker.  She reports that she is scheduled to do PT at home.    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    Review of Systems  Constitutional: Negative for chills, fever and weight loss.  Respiratory: Negative for cough and hemoptysis.   Cardiovascular: Negative for chest pain, palpitations and orthopnea.  Gastrointestinal: Negative for abdominal pain, nausea and vomiting.  Neurological: Negative for dizziness, tingling and headaches.       Current medications and allergies reviewed and updated. Past medical history, family history, social history have been reviewed and updated.   Physical Exam  Constitutional: She is oriented to person, place, and time and well-developed, well-nourished, and in no distress.  Uses rolling walker Morbidly obese  HENT:  Head: Normocephalic and atraumatic.  Eyes: Conjunctivae and EOM are normal.  Cardiovascular: Normal rate, regular rhythm and normal heart sounds.   Pulmonary/Chest: Breath sounds normal. No respiratory distress.  Musculoskeletal: She exhibits edema.  Neurological: [name] and oriented to person, place, and time. She displays normal reflexes.  A tuning fork was used to test vibratory sense and patient had difficulty sensing vibration and along the  lateral aspect of the foot the vibration was perceived as sharp pain. Pinprick was perceived as dull along the sole of the foot but sharp and painful on the dorsum of the foot.   Psychiatric: Mood, memory, affect and judgment normal.     Assessment and Plan: Theresa Kirby is a 60 y.o. female with history of nontraumatic rhabdomyolysis who now presents for follow up after hospital discharge.  Due to her sensory nerve disorder will recommend Neurology consultation Continue with PT Continue fall precautions Emphasized patient should continue using her walker Patient is not cleared to go back to work as she cannot stand or walk without assistive device  A total of 30 minutes were spent face-to-face with the patient during this encounter and over half of that time was spent on counseling and coordination of care.

## 2016-06-24 ENCOUNTER — Telehealth: Payer: Self-pay | Admitting: Emergency Medicine

## 2016-06-24 NOTE — Telephone Encounter (Signed)
Spoke with patient regarding paperwork needed per Dr. Nolon Rod.  Advised she will need to contact medical records to get her hospital records sent to labor dept.  Verbalized understanding

## 2016-07-05 ENCOUNTER — Telehealth: Payer: Self-pay | Admitting: *Deleted

## 2016-07-05 ENCOUNTER — Telehealth: Payer: Self-pay

## 2016-07-05 ENCOUNTER — Ambulatory Visit: Payer: Federal, State, Local not specified - PPO | Admitting: Neurology

## 2016-07-05 NOTE — Telephone Encounter (Signed)
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

## 2016-07-05 NOTE — Telephone Encounter (Signed)
Unable to be seen for new patient appt - worker's comp case.

## 2016-07-08 ENCOUNTER — Telehealth: Payer: Self-pay | Admitting: Family Medicine

## 2016-07-08 NOTE — Telephone Encounter (Signed)
Note    Pt calling stating that [place] neurologist  do not accept workers comp and she needs to be referred to a neurologist that will work with workers comp

## 2016-07-08 NOTE — Telephone Encounter (Signed)
Her referral has been resent to [facility]. She should receive a call from them today -- I will keep an eye on it and contact their office if necessary.

## 2017-03-13 ENCOUNTER — Emergency Department (HOSPITAL_COMMUNITY): Payer: Federal, State, Local not specified - PPO

## 2017-03-13 ENCOUNTER — Encounter (HOSPITAL_COMMUNITY): Payer: Self-pay | Admitting: Emergency Medicine

## 2017-03-13 ENCOUNTER — Inpatient Hospital Stay (HOSPITAL_COMMUNITY)
Admission: EM | Admit: 2017-03-13 | Discharge: 2017-03-19 | DRG: 291 | Disposition: A | Discharge status: Home Health | Payer: Federal, State, Local not specified - PPO | Attending: Internal Medicine | Admitting: Internal Medicine

## 2017-03-13 DIAGNOSIS — I5031 Acute diastolic (congestive) heart failure: Secondary | ICD-10-CM | POA: Diagnosis present

## 2017-03-13 DIAGNOSIS — I509 Heart failure, unspecified: Secondary | ICD-10-CM | POA: Diagnosis not present

## 2017-03-13 DIAGNOSIS — Z6841 Body Mass Index (BMI) 40.0 and over, adult: Secondary | ICD-10-CM

## 2017-03-13 DIAGNOSIS — R030 Elevated blood-pressure reading, without diagnosis of hypertension: Secondary | ICD-10-CM

## 2017-03-13 DIAGNOSIS — R251 Tremor, unspecified: Secondary | ICD-10-CM | POA: Diagnosis present

## 2017-03-13 DIAGNOSIS — E0781 Sick-euthyroid syndrome: Secondary | ICD-10-CM | POA: Diagnosis present

## 2017-03-13 DIAGNOSIS — I5023 Acute on chronic systolic (congestive) heart failure: Secondary | ICD-10-CM | POA: Diagnosis not present

## 2017-03-13 DIAGNOSIS — E1165 Type 2 diabetes mellitus with hyperglycemia: Secondary | ICD-10-CM | POA: Diagnosis present

## 2017-03-13 DIAGNOSIS — J9601 Acute respiratory failure with hypoxia: Secondary | ICD-10-CM | POA: Diagnosis present

## 2017-03-13 DIAGNOSIS — Z79899 Other long term (current) drug therapy: Secondary | ICD-10-CM

## 2017-03-13 DIAGNOSIS — Z7982 Long term (current) use of aspirin: Secondary | ICD-10-CM | POA: Diagnosis not present

## 2017-03-13 DIAGNOSIS — G4733 Obstructive sleep apnea (adult) (pediatric): Secondary | ICD-10-CM | POA: Diagnosis present

## 2017-03-13 DIAGNOSIS — I110 Hypertensive heart disease with heart failure: Secondary | ICD-10-CM | POA: Diagnosis present

## 2017-03-13 DIAGNOSIS — R1011 Right upper quadrant pain: Secondary | ICD-10-CM | POA: Diagnosis present

## 2017-03-13 DIAGNOSIS — Z9119 Patient's noncompliance with other medical treatment and regimen: Secondary | ICD-10-CM

## 2017-03-13 DIAGNOSIS — I5033 Acute on chronic diastolic (congestive) heart failure: Secondary | ICD-10-CM | POA: Diagnosis not present

## 2017-03-13 DIAGNOSIS — J45909 Unspecified asthma, uncomplicated: Secondary | ICD-10-CM | POA: Diagnosis present

## 2017-03-13 DIAGNOSIS — Z882 Allergy status to sulfonamides status: Secondary | ICD-10-CM | POA: Diagnosis not present

## 2017-03-13 DIAGNOSIS — E039 Hypothyroidism, unspecified: Secondary | ICD-10-CM | POA: Diagnosis present

## 2017-03-13 DIAGNOSIS — R911 Solitary pulmonary nodule: Secondary | ICD-10-CM | POA: Diagnosis present

## 2017-03-13 DIAGNOSIS — I5030 Unspecified diastolic (congestive) heart failure: Secondary | ICD-10-CM | POA: Diagnosis not present

## 2017-03-13 HISTORY — DX: Iron deficiency anemia, unspecified: D50.9

## 2017-03-13 HISTORY — DX: Morbid (severe) obesity due to excess calories: E66.01

## 2017-03-13 HISTORY — DX: Rhabdomyolysis: M62.82

## 2017-03-13 LAB — BRAIN NATRIURETIC PEPTIDE: B Natriuretic Peptide: 210.2 pg/mL — ABNORMAL HIGH (ref 0.0–100.0)

## 2017-03-13 LAB — COMPREHENSIVE METABOLIC PANEL
ALT: 19 U/L (ref 14–54)
ANION GAP: 8 (ref 5–15)
AST: 32 U/L (ref 15–41)
Albumin: 3.3 g/dL — ABNORMAL LOW (ref 3.5–5.0)
Alkaline Phosphatase: 39 U/L (ref 38–126)
BUN: 23 mg/dL — ABNORMAL HIGH (ref 6–20)
CHLORIDE: 101 mmol/L (ref 101–111)
CO2: 32 mmol/L (ref 22–32)
CREATININE: 0.94 mg/dL (ref 0.44–1.00)
Calcium: 8.8 mg/dL — ABNORMAL LOW (ref 8.9–10.3)
GFR calc non Af Amer: >60 mL/min (ref >60)
GLUCOSE: 93 mg/dL (ref 65–99)
Potassium: 4.6 mmol/L (ref 3.5–5.1)
SODIUM: 141 mmol/L (ref 135–145)
Total Bilirubin: 0.9 mg/dL (ref 0.3–1.2)
Total Protein: 6.5 g/dL (ref 6.5–8.1)

## 2017-03-13 LAB — CBC WITH DIFFERENTIAL/PLATELET
Basophils Absolute: 0 10*3/uL (ref 0.0–0.1)
Basophils Relative: 1 %
Eosinophils Absolute: 0.1 10*3/uL (ref 0.0–0.7)
Eosinophils Relative: 1 %
HEMATOCRIT: 45.5 % (ref 36.0–46.0)
HEMOGLOBIN: 13.9 g/dL (ref 12.0–15.0)
LYMPHS ABS: 1.7 10*3/uL (ref 0.7–4.0)
Lymphocytes Relative: 23 %
MCH: 26.1 pg (ref 26.0–34.0)
MCHC: 30.5 g/dL (ref 30.0–36.0)
MCV: 85.5 fL (ref 78.0–100.0)
MONO ABS: 0.4 10*3/uL (ref 0.1–1.0)
MONOS PCT: 6 %
NEUTROS ABS: 5.1 10*3/uL (ref 1.7–7.7)
NEUTROS PCT: 69 %
Platelets: 223 10*3/uL (ref 150–400)
RBC: 5.32 MIL/uL — ABNORMAL HIGH (ref 3.87–5.11)
RDW: 17 % — ABNORMAL HIGH (ref 11.5–15.5)
WBC: 7.3 10*3/uL (ref 4.0–10.5)

## 2017-03-13 LAB — I-STAT TROPONIN, ED: Troponin i, poc: 0.01 ng/mL (ref 0.00–0.08)

## 2017-03-13 LAB — LIPASE, BLOOD: Lipase: 20 U/L (ref 11–51)

## 2017-03-13 LAB — CBG MONITORING, ED: GLUCOSE-CAPILLARY: 85 mg/dL (ref 65–99)

## 2017-03-13 MED ORDER — FUROSEMIDE 10 MG/ML IJ SOLN
40.0000 mg | INTRAMUSCULAR | Status: AC
  Administered 2017-03-13: 40 mg via INTRAVENOUS
  Filled 2017-03-13: qty 4

## 2017-03-13 MED ORDER — IPRATROPIUM-ALBUTEROL 0.5-2.5 (3) MG/3ML IN SOLN
3.0000 mL | Freq: Once | RESPIRATORY_TRACT | Status: AC
  Administered 2017-03-13: 3 mL via RESPIRATORY_TRACT
  Filled 2017-03-13: qty 3

## 2017-03-13 NOTE — H&P (Signed)
History and Physical    Theresa Kirby LFY:[id number] DOB: February 07, 1956 DOA: 03/13/2017  PCP: Susa Simmonds Consultants:  Suzette Battiest - endocrinology; Tamala Julian - chiropractor Patient coming from:  Home - lives alone; NOK: Daughter, [phone]  Chief Complaint: "I couldn't breathe"  HPI: Theresa Kirby is a 61 y.o. female with medical history significant of hypothyroidism; OSA on CPAP sometimes; asthma; and arthritis presenting with c/o SOB.  "The more I moved, the more I couldn't breathe".  Symptoms started slowly, "kind of crept up on me", but really noticed it over the weekend.  Noticed her face was swollen, slept all day and just haven't bounced back.  She called her PCP and asked for an apopiintment.  Also with abdominal pain and R hand tremor, burping air.  Not peeing as much as usual.  She went to the appt today and they sent her to the ER.  O2 saturation was too low, 82%.  SOB is worse with exertion and talking.  No chest pain.  Abdomen is very tight.  Midepigastric and RUQ pain.  +LE edema for about a week.  Weight gain of 20 pounds since May.  Propping herself with pillows so she doesn't lie flat.  Feels anxious at night, which keeps her awake.     ED Course: Dyspnea/hypoxia requiring 3L [place] O2.  PE and CXR with e/o volume overload.  CHF, 40 mg IV Lasix, admission.  Review of Systems: As per HPI; otherwise review of systems reviewed and negative.   Ambulatory Status:  Ambulates with a cane  Past Medical History:  Diagnosis Date  . Arthritis    knees  . Asthma   . Rhabdomyolysis 04/2016   with compartment syndrome on RLE  . Sleep apnea    uses cpap, setting is unknown, does not wear it consistently  . Thyroid disease     Past Surgical History:  Procedure Laterality Date  . CESAREAN SECTION    . COLONOSCOPY WITH PROPOFOL N/A 07/21/2015   Procedure: COLONOSCOPY WITH PROPOFOL;  Surgeon: Juanita Craver, MD;  Location: [facility] ENDOSCOPY;  Service: Endoscopy;  Laterality: N/A;  . DILATION AND  CURETTAGE OF UTERUS    . ENDOMETRIAL ABLATION    . GANGLION CYST EXCISION     rt arm  . SHOULDER SURGERY Left    Rotator cuff repair    Social History   Social History  . Marital status: Single    Spouse name: N/A  . Number of children: N/A  . Years of education: N/A   Occupational History  . 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 Smoking status: Never Smoker  . Smokeless tobacco: Never Used  . Alcohol use No  . Drug use: No  . Sexual activity: Not Currently    Birth control/ protection: None   Other Topics Concern  . Not on file   Social History Narrative  . No narrative on file    Allergies  Allergen Reactions  . Sulfa Antibiotics Hives and Shortness Of Breath    Family History  Problem Relation Age of Onset  . Heart disease Mother   . Heart failure Mother   . Hypertension Brother   . Hypertension Maternal Grandmother   . Heart disease Maternal Grandmother     Prior to Admission medications   Medication Sig Start Date End Date Taking? Authorizing Provider  acetaminophen (TYLENOL) 325 MG tablet Take 650 mg by mouth every 6 (six) hours as needed (pain).  Yes [provider]  albuterol (PROAIR HFA) 108 (90 BASE) MCG/ACT inhaler Inhale 2 puffs into the lungs every 6 (six) hours as needed for wheezing or shortness of breath.  06/20/14  Yes [provider]  diclofenac sodium (VOLTAREN) 1 % GEL Apply 2 g topically 2 (two) times daily as needed (pain). Applies to knees. 06/13/14  Yes [provider]  ferrous sulfate 325 (65 FE) MG tablet Take 325 mg by mouth 3 (three) times daily with meals.   Yes [provider]  montelukast (SINGULAIR) 10 MG tablet Take 10 mg by mouth at bedtime as needed (allergies.).    Yes [provider]  Multiple Vitamin (MULTIVITAMIN) capsule Take 1 capsule by mouth daily.   Yes [provider]  Omega-3 Fatty Acids (FISH OIL PO) Take 1 capsule by mouth  daily.   Yes [provider]  HYDROcodone-acetaminophen (NORCO/VICODIN) 5-325 MG tablet Take 1-2 tablets by mouth every 4 (four) hours as needed. Patient not taking: Reported on 03/13/2017 05/08/16   [name], MD  methocarbamol (ROBAXIN) 500 MG tablet Take 1 tablet (500 mg total) by mouth 2 (two) times daily. Patient not taking: Reported on 03/13/2017 05/05/16   Sam, Olivia Canter, PA-C  naproxen (NAPROSYN) 500 MG tablet Take 1 tablet (500 mg total) by mouth 2 (two) times daily. Patient not taking: Reported on 03/13/2017 05/05/16   Sam, Olivia Canter, PA-C  polyethylene glycol [facility] / Floria Raveling) packet Take 17 g by mouth 2 (two) times daily. Patient not taking: Reported on 03/13/2017 05/09/16   [name], MD    Physical Exam: Vitals:   03/13/17 1645 03/13/17 1715 03/13/17 1815 03/13/17 1900  BP: (!) 144/72 (!) 156/72 132/77 (!) 146/91  Pulse: (!) 58 (!) 53 (!) 58 (!) 58  Resp: (!) 23 (!) 26 14 16   Temp:      TempSrc:      SpO2: 99% 90% 94% 94%  Weight:      Height:         General:  Appears calm and comfortable and is NAD; mild dyspnea with significant conversation Eyes:  PERRL, EOMI, normal lids, iris ENT:  grossly normal hearing, lips & tongue, mmm; appropriate dentition Neck:  no LAD, masses or thyromegaly; no carotid bruits Cardiovascular:  RRR, no m/r/g. 3+ taut pitting LE edema.  Respiratory: Left basilar crackles.  Normal respiratory effort at rest but increased WOB with conversation. Abdomen:  soft, NT, ND, NABS; obese, difficult to ascertain if there is edema present Back:   normal alignment, no CVAT Skin:  no rash or induration seen on limited exam Musculoskeletal:  grossly normal tone BUE/BLE, good ROM, no bony abnormality Psychiatric:  grossly normal mood and affect, speech fluent and appropriate, AOx3 Neurologic:  CN 2-12 grossly intact, moves all extremities in coordinated fashion, sensation intact    Radiological Exams on Admission: Dg Chest  Port 1 View  Result Date: 03/13/2017 CLINICAL DATA:  Worsening shortness of breath. EXAM: PORTABLE CHEST 1 VIEW COMPARISON:  CT 05/07/2016.  Chest x-ray 05/05/2016 . FINDINGS: Cardiomegaly with bilateral pulmonary interstitial prominence consistent with congestive heart failure. No pleural effusion or pneumothorax. IMPRESSION: Congestive heart failure with bilateral pulmonary interstitial edema. Electronically Signed   By: Marcello Moores  Register   On: 03/13/2017 16:33    EKG: Independently reviewed.  NSR with rate 58; IVCD, nonspecific ST changes with no evidence of acute ischemia   Labs on Admission: I have personally reviewed the available labs and imaging studies at the  time of the admission.  Pertinent labs:   BUN 23/Creatinine 0.94/GFR >60 - stable BNP 210.2 Troponin 0.01  Assessment/Plan Principal Problem:   Acute respiratory failure with hypoxia (HCC) Active Problems:   Obesity   CHF exacerbation (HCC)   Hypothyroidism   OSA (obstructive sleep apnea)   Acute respiratory failure with hypoxia resulting from new-onset CHF -Patient without smoking history or prior h/o respiratory failure presenting with worsening SOB and hypoxia -PE appears to show volume overload -CXR more consistent with pulmonary edema -Normal WBC count, no fever, does not need antibiotics -Mildly elevated BNP -With elevated BNP and abnl CXR, new-onset CHF seems probable as diagnosis -Will admit with telemetry -Will request echocardiogram -Will start ASA -Will start Lisinopril 5 mg daily (BP is normal and so should support addition of low-dose medication) -No beta blocker due to bradycardia on presentation - daughter does not report h/o this in the past, so will follow -CHF order set utilized -Cardiology consultation requested via [place] -Was given Lasix 40 mg x 1 in ER and will repeat with 20 mg IV BID starting tomorrow AM; based on estimated 20 pound weight gain, expect that diuresis may be slow and take  several days to fully accomplish -Continue [place] O2 for now -Normal kidney function at this time, will follow -Repeat EKG in AM -Negative troponin, no chest pain; will not trend -Risk stratification with FLP and A1c; will add TSH  Obesity -Nutrition consult -Normal glucose but h/o mild hyperglycemia in the past; will check A1c  OSA -Inconsistent use of CPAP -Does not know setting -Will order with autopap  Hypothyroidism -Check TSH -Continue Synthroid at current dose for now    DVT prophylaxis: Lovenox  Code Status:  Full - confirmed with patient/family Family Communication: Daughter present throughout evaluation  Disposition Plan:  Home once clinically improved Consults called: Cardiology, Nutrition  Admission status: Admit - It is my clinical opinion that admission to INPATIENT is reasonable and necessary because this patient will require at least 2 midnights in the hospital to treat this condition based on the medical complexity of the problems presented.  Given the aforementioned information, the predictability of an adverse outcome is felt to be significant.    Karmen Bongo MD [facility]  If note is complete, please contact covering daytime or nighttime physician. [email or web address] Password [facility]  03/13/2017, 8:38 PM

## 2017-03-13 NOTE — ED Triage Notes (Signed)
Pt went to her MD today with increased SOB since last week. Increased SOB with any exertion. Pt complaining of right upper quadrant abd pain. Pt has gained 20lbs over last month- legs and abd. 84% at MD office on room air. 3L [place] 97%. Resp 24 while at rest. SR on monitor HR 60, BP 180/92.

## 2017-03-13 NOTE — ED Notes (Signed)
Report attempted x 1

## 2017-03-13 NOTE — ED Notes (Signed)
Pt given turkey sandwich and water

## 2017-03-13 NOTE — ED Provider Notes (Signed)
[facility] DEPT Provider Note   CSN: [id number] Arrival date & time: 03/13/17  1551     History   Chief Complaint Chief Complaint  Patient presents with  . Shortness of Breath    HPI Theresa Kirby is a 61 y.o. female.  61 year old female with past medical history including asthma, OSA, thyroid disease who presents with shortness of breath. Patient states that for the past one week she has had progressively worsening shortness of breath especially with exertion. Her shortness of breath may have started before then that she did not notice it as much is in this past week. She endorses associated 20 pound weight gain over the past one month, bilateral lower extremity edema, orthopnea, and paroxysmal nocturnal dyspnea. She has had a cough productive of clear phlegm. No associated fevers or vomiting. She reports some intermittent midepigastric and right upper quadrant pain that is sometimes worse after she is and is associated with belching. She denies any NSAID or alcohol use. No chest pain. She was seen in the clinic today and noted to be 84% on room air, placed on supplemental oxygen and sent here for further evaluation.  Family history notable for mother with history of CHF, maternal grandmother with history of heart disease.   The history is provided by the patient.  Shortness of Breath     Past Medical History:  Diagnosis Date  . Arthritis    knees  . Asthma   . Sleep apnea    uses cpap  . Thyroid disease     Patient Active Problem List   Diagnosis Date Noted  . Leg pain, right 05/06/2016  . Right leg swelling 05/06/2016  . Hypoxia 05/06/2016  . SOB (shortness of breath) 05/06/2016  . Irregular bleeding 03/15/2012  . Anxiety 02/08/2012  . Obesity 02/08/2012    Past Surgical History:  Procedure Laterality Date  . CESAREAN SECTION    . COLONOSCOPY WITH PROPOFOL N/A 07/21/2015   Procedure: COLONOSCOPY WITH PROPOFOL;  Surgeon: Juanita Craver, MD;  Location: [facility]  ENDOSCOPY;  Service: Endoscopy;  Laterality: N/A;  . DILATION AND CURETTAGE OF UTERUS    . ENDOMETRIAL ABLATION    . GANGLION CYST EXCISION     rt arm  . SHOULDER SURGERY Left    Rotator cuff repair    OB History    No data available       Home Medications    Prior to Admission medications   Medication Sig Start Date End Date Taking? Authorizing Provider  acetaminophen (TYLENOL) 325 MG tablet Take 650 mg by mouth every 6 (six) hours as needed (pain).    Yes [provider]  albuterol (PROAIR HFA) 108 (90 BASE) MCG/ACT inhaler Inhale 2 puffs into the lungs every 6 (six) hours as needed for wheezing or shortness of breath.  06/20/14  Yes [provider]  diclofenac sodium (VOLTAREN) 1 % GEL Apply 2 g topically 2 (two) times daily as needed (pain). Applies to knees. 06/13/14  Yes [provider]  ferrous sulfate 325 (65 FE) MG tablet Take 325 mg by mouth 3 (three) times daily with meals.   Yes [provider]  montelukast (SINGULAIR) 10 MG tablet Take 10 mg by mouth at bedtime as needed (allergies.).    Yes [provider]  Multiple Vitamin (MULTIVITAMIN) capsule Take 1 capsule by mouth daily.   Yes [provider]  Omega-3 Fatty Acids (FISH OIL PO) Take 1 capsule by mouth daily.   Yes [provider]  HYDROcodone-acetaminophen (NORCO/VICODIN) 5-325 MG tablet Take 1-2 tablets by mouth every 4 (four) hours as needed. Patient not taking: Reported on 03/13/2017 05/08/16   [name], MD  methocarbamol (ROBAXIN) 500 MG tablet Take 1 tablet (500 mg total) by mouth 2 (two) times daily. Patient not taking: Reported on 03/13/2017 05/05/16   Sam, Olivia Canter, PA-C  naproxen (NAPROSYN) 500 MG tablet Take 1 tablet (500 mg total) by mouth 2 (two) times daily. Patient not taking: Reported on 03/13/2017 05/05/16   Sam, Olivia Canter, PA-C  polyethylene glycol [facility] / Floria Raveling) packet Take 17 g by mouth 2 (two) times daily. Patient not  taking: Reported on 03/13/2017 05/09/16   [name], MD    Family History Family History  Problem Relation Age of Onset  . Heart disease Mother   . Hypertension Brother   . Hypertension Maternal Grandmother   . Heart disease Maternal Grandmother     Social History Social History  Substance Use Topics  . Smoking status: Never Smoker  . Smokeless tobacco: Never Used  . Alcohol use No     Allergies   Sulfa antibiotics   Review of Systems Review of Systems  Respiratory: Positive for shortness of breath.    All other systems reviewed and are negative except that which was mentioned in HPI   Physical Exam Updated Vital Signs BP (!) 156/72   Pulse (!) 53   Temp 98.7 F (37.1 C) (Oral)   Resp (!) 26   Ht 5\' 4"  (1.626 m)   Wt (!) 162.4 kg (358 lb)   LMP 05/12/2012   SpO2 90%   BMI 61.45 kg/m   Physical Exam  Constitutional: She is oriented to person, place, and time. She appears well-developed and well-nourished. No distress.  HENT:  Head: Normocephalic and atraumatic.  Moist mucous membranes  Eyes: Pupils are equal, round, and reactive to light. Conjunctivae are normal.  Neck: Neck supple.  Cardiovascular: Normal rate, regular rhythm and normal heart sounds.   No murmur heard. Pulmonary/Chest:  Dyspneic without respiratory distress, diminished BS b/l, occasional end expiratory wheeze  Abdominal: Soft. Bowel sounds are normal. She exhibits no distension. There is no tenderness.  Musculoskeletal: She exhibits edema (3+ pitting to knees).  Neurological: [name] and oriented to person, place, and time.  Fluent speech  Skin: Skin is warm and dry.  Psychiatric: She has a normal mood and affect. Judgment normal.  Nursing note and vitals reviewed.    ED Treatments / Results  Labs (all labs ordered are listed, but only abnormal results are displayed) Labs Reviewed  COMPREHENSIVE METABOLIC PANEL - Abnormal; Notable for the following:       Result  Value   BUN 23 (*)    Calcium 8.8 (*)    Albumin 3.3 (*)    All other components within normal limits  CBC WITH DIFFERENTIAL/PLATELET - Abnormal; Notable for the following:    RBC 5.32 (*)    RDW 17.0 (*)    All other components within normal limits  BRAIN NATRIURETIC PEPTIDE - Abnormal; Notable for the following:    B Natriuretic Peptide 210.2 (*)    All other components within normal limits  LIPASE, BLOOD  I-STAT TROPONIN, ED  CBG MONITORING, ED    EKG  EKG Interpretation  Date/Time:  Monday March 13 2017 16:06:13 EDT Ventricular Rate:  58 PR Interval:    QRS Duration: 122 QT Interval:  466 QTC Calculation: 458 R Axis:   91 Text  Interpretation:  Sinus rhythm Nonspecific intraventricular conduction delay Probable anteroseptal infarct, old T wave inversion in V3 compared to previous Confirmed by Theotis Burrow [phone]) on 03/13/2017 5:10:50 PM       Radiology Dg Chest Port 1 View  Result Date: 03/13/2017 CLINICAL DATA:  Worsening shortness of breath. EXAM: PORTABLE CHEST 1 VIEW COMPARISON:  CT 05/07/2016.  Chest x-ray 05/05/2016 . FINDINGS: Cardiomegaly with bilateral pulmonary interstitial prominence consistent with congestive heart failure. No pleural effusion or pneumothorax. IMPRESSION: Congestive heart failure with bilateral pulmonary interstitial edema. Electronically Signed   By: Marcello Moores  Register   On: 03/13/2017 16:33    Procedures Procedures (including critical care time)  Medications Ordered in ED Medications  ipratropium-albuterol (DUONEB) 0.5-2.5 (3) MG/3ML nebulizer solution 3 mL (3 mLs Nebulization Given 03/13/17 1636)  furosemide (LASIX) injection 40 mg (40 mg Intravenous Given 03/13/17 1729)     Initial Impression / Assessment and Plan / ED Course  I have reviewed the triage vital signs and the nursing notes.  Pertinent labs & imaging results that were available during my care of the patient were reviewed by me and considered in my medical decision  making (see chart for details).     Pt w/ Progressively worsening shortness of breath associated with weight gain, lower extremity edema, orthopnea, and paroxysmal nocturnal dyspnea. Sent here after hypoxia noted in the clinic. On exam, she was dyspneic requiring 3L O2 but no respiratory distress. Evidence of volume overload on exam. Her labs show normal creatinine, negative initial troponin, normal LFTs, normal CBC. BNP is 210, however her chest x-ray shows evidence of CHF with bilateral edema and her physical exam corresponds with diagnosis of CHF. Gave Lasix and discussed admission with Dr. Lorin Mercy, [facility], and pt admitted for further care.  Final Clinical Impressions(s) / ED Diagnoses   Final diagnoses:  Acute congestive heart failure, unspecified heart failure type ([place])  Acute respiratory failure with hypoxia [facility])    New Prescriptions New Prescriptions   No medications on file     Little, Wenda Overland, MD 03/14/17 1152

## 2017-03-14 ENCOUNTER — Encounter (HOSPITAL_COMMUNITY): Payer: Self-pay | Admitting: Physician Assistant

## 2017-03-14 ENCOUNTER — Inpatient Hospital Stay (HOSPITAL_COMMUNITY): Payer: Federal, State, Local not specified - PPO

## 2017-03-14 DIAGNOSIS — J9601 Acute respiratory failure with hypoxia: Secondary | ICD-10-CM

## 2017-03-14 DIAGNOSIS — I5030 Unspecified diastolic (congestive) heart failure: Secondary | ICD-10-CM

## 2017-03-14 DIAGNOSIS — G4733 Obstructive sleep apnea (adult) (pediatric): Secondary | ICD-10-CM

## 2017-03-14 DIAGNOSIS — R030 Elevated blood-pressure reading, without diagnosis of hypertension: Secondary | ICD-10-CM

## 2017-03-14 DIAGNOSIS — I5033 Acute on chronic diastolic (congestive) heart failure: Secondary | ICD-10-CM

## 2017-03-14 LAB — LIPID PANEL
CHOL/HDL RATIO: 3.8 ratio
Cholesterol: 151 mg/dL (ref 0–200)
HDL: 40 mg/dL — AB (ref >40)
LDL CALC: 90 mg/dL (ref 0–99)
Triglycerides: 107 mg/dL (ref <150)
VLDL: 21 mg/dL (ref 0–40)

## 2017-03-14 LAB — URINALYSIS, ROUTINE W REFLEX MICROSCOPIC
BILIRUBIN URINE: NEGATIVE
Bacteria, UA: NONE SEEN
GLUCOSE, UA: NEGATIVE mg/dL
KETONES UR: NEGATIVE mg/dL
LEUKOCYTES UA: NEGATIVE
NITRITE: NEGATIVE
PH: 6 (ref 5.0–8.0)
Protein, ur: NEGATIVE mg/dL
SPECIFIC GRAVITY, URINE: 1.009 (ref 1.005–1.030)

## 2017-03-14 LAB — CBC WITH DIFFERENTIAL/PLATELET
BASOS PCT: 0 %
Basophils Absolute: 0 10*3/uL (ref 0.0–0.1)
EOS ABS: 0.1 10*3/uL (ref 0.0–0.7)
Eosinophils Relative: 2 %
HEMATOCRIT: 47.8 % — AB (ref 36.0–46.0)
HEMOGLOBIN: 13.9 g/dL (ref 12.0–15.0)
LYMPHS ABS: 1.5 10*3/uL (ref 0.7–4.0)
LYMPHS PCT: 22 %
MCH: 25.4 pg — AB (ref 26.0–34.0)
MCHC: 29.1 g/dL — ABNORMAL LOW (ref 30.0–36.0)
MCV: 87.2 fL (ref 78.0–100.0)
Monocytes Absolute: 0.5 10*3/uL (ref 0.1–1.0)
Monocytes Relative: 7 %
Neutro Abs: 4.6 10*3/uL (ref 1.7–7.7)
Neutrophils Relative %: 69 %
Platelets: 219 10*3/uL (ref 150–400)
RBC: 5.48 MIL/uL — AB (ref 3.87–5.11)
RDW: 17.2 % — ABNORMAL HIGH (ref 11.5–15.5)
WBC: 6.7 10*3/uL (ref 4.0–10.5)

## 2017-03-14 LAB — BASIC METABOLIC PANEL
Anion gap: 4 — ABNORMAL LOW (ref 5–15)
BUN: 19 mg/dL (ref 6–20)
CHLORIDE: 99 mmol/L — AB (ref 101–111)
CO2: 40 mmol/L — ABNORMAL HIGH (ref 22–32)
Calcium: 8.6 mg/dL — ABNORMAL LOW (ref 8.9–10.3)
Creatinine, Ser: 0.99 mg/dL (ref 0.44–1.00)
GFR calc non Af Amer: >60 mL/min (ref >60)
Glucose, Bld: 117 mg/dL — ABNORMAL HIGH (ref 65–99)
POTASSIUM: 4.1 mmol/L (ref 3.5–5.1)
SODIUM: 143 mmol/L (ref 135–145)

## 2017-03-14 LAB — ECHOCARDIOGRAM COMPLETE
CHL CUP DOP CALC LVOT VTI: 33.4 cm
CHL CUP STROKE VOLUME: 76 mL
E decel time: 250 msec
EERAT: 12.2
FS: 36 % (ref 28–44)
HEIGHTINCHES: 64 in
IVS/LV PW RATIO, ED: 0.79
LA diam index: 1.58 cm/m2
LA vol A4C: 50.7 ml
LASIZE: 39 mm
LAVOL: 56.8 mL
LAVOLIN: 23 mL/m2
LEFT ATRIUM END SYS DIAM: 39 mm
LV PW d: 14 mm — AB (ref 0.6–1.1)
LV SIMPSON'S DISK: 75
LV dias vol index: 41 mL/m2
LV dias vol: 101 mL (ref 46–106)
LV e' LATERAL: 9.02 cm/s
LV sys vol index: 10 mL/m2
LV sys vol: 25 mL (ref 14–42)
LVEEAVG: 12.2
LVEEMED: 12.2
LVOT area: 2.84 cm2
LVOT diameter: 19 mm
LVOT peak grad rest: 8 mmHg
LVOTPV: 143 cm/s
LVOTSV: 95 mL
Lateral S' vel: 12 cm/s
MV Dec: 250
MV pk A vel: 98.3 m/s
MV pk E vel: 110 m/s
MVPG: 5 mmHg
RV TAPSE: 26.4 mm
TDI e' lateral: 9.02
TDI e' medial: 6.89
WEIGHTICAEL: 5563.2 [oz_av]

## 2017-03-14 LAB — HIV ANTIBODY (ROUTINE TESTING W REFLEX): HIV Screen 4th Generation wRfx: NONREACTIVE

## 2017-03-14 LAB — HEMOGLOBIN A1C
HEMOGLOBIN A1C: 6.9 % — AB (ref 4.8–5.6)
Mean Plasma Glucose: 151.33 mg/dL

## 2017-03-14 LAB — TSH: TSH: 5.971 u[IU]/mL — AB (ref 0.350–4.500)

## 2017-03-14 MED ORDER — FUROSEMIDE 10 MG/ML IJ SOLN
40.0000 mg | Freq: Two times a day (BID) | INTRAMUSCULAR | Status: DC
  Administered 2017-03-14 – 2017-03-16 (×5): 40 mg via INTRAVENOUS
  Filled 2017-03-14 (×5): qty 4

## 2017-03-14 MED ORDER — ORAL CARE MOUTH RINSE
15.0000 mL | Freq: Two times a day (BID) | OROMUCOSAL | Status: DC
  Administered 2017-03-14 – 2017-03-18 (×4): 15 mL via OROMUCOSAL

## 2017-03-14 MED ORDER — CHLORHEXIDINE GLUCONATE 0.12 % MT SOLN
15.0000 mL | Freq: Two times a day (BID) | OROMUCOSAL | Status: DC
  Administered 2017-03-14 – 2017-03-19 (×9): 15 mL via OROMUCOSAL
  Filled 2017-03-14 (×9): qty 15

## 2017-03-14 MED ORDER — ALBUTEROL SULFATE (2.5 MG/3ML) 0.083% IN NEBU: 3.0000 mL | INHALATION_SOLUTION | Freq: Four times a day (QID) | RESPIRATORY_TRACT | Status: DC | PRN

## 2017-03-14 MED ORDER — SODIUM CHLORIDE 0.9% FLUSH
3.0000 mL | Freq: Two times a day (BID) | INTRAVENOUS | Status: DC
  Administered 2017-03-14 – 2017-03-19 (×12): 3 mL via INTRAVENOUS

## 2017-03-14 MED ORDER — LIVING BETTER WITH HEART FAILURE BOOK
Freq: Once | Status: AC
  Administered 2017-03-14: 09:00:00

## 2017-03-14 MED ORDER — FUROSEMIDE 10 MG/ML IJ SOLN: 20.0000 mg | Freq: Two times a day (BID) | INTRAMUSCULAR | Status: DC

## 2017-03-14 MED ORDER — LISINOPRIL 5 MG PO TABS: 5.0000 mg | ORAL_TABLET | Freq: Every day | ORAL | Status: DC

## 2017-03-14 MED ORDER — ACETAMINOPHEN 325 MG PO TABS
650.0000 mg | ORAL_TABLET | ORAL | Status: DC | PRN
  Administered 2017-03-14 – 2017-03-15 (×3): 650 mg via ORAL
  Filled 2017-03-14 (×3): qty 2

## 2017-03-14 MED ORDER — PERFLUTREN LIPID MICROSPHERE
INTRAVENOUS | Status: AC
  Filled 2017-03-14: qty 10

## 2017-03-14 MED ORDER — MONTELUKAST SODIUM 10 MG PO TABS: 10.0000 mg | ORAL_TABLET | Freq: Every evening | ORAL | Status: DC | PRN

## 2017-03-14 MED ORDER — ENOXAPARIN SODIUM 80 MG/0.8ML ~~LOC~~ SOLN
0.5000 mg/kg | SUBCUTANEOUS | Status: DC
  Administered 2017-03-15 – 2017-03-18 (×4): 80 mg via SUBCUTANEOUS
  Filled 2017-03-14 (×4): qty 0.8

## 2017-03-14 MED ORDER — ENOXAPARIN SODIUM 40 MG/0.4ML ~~LOC~~ SOLN
40.0000 mg | SUBCUTANEOUS | Status: DC
  Administered 2017-03-14: 40 mg via SUBCUTANEOUS
  Filled 2017-03-14: qty 0.4

## 2017-03-14 MED ORDER — ONDANSETRON HCL 4 MG/2ML IJ SOLN: 4.0000 mg | Freq: Four times a day (QID) | INTRAMUSCULAR | Status: DC | PRN

## 2017-03-14 MED ORDER — LIVING WELL WITH DIABETES BOOK
Freq: Once | Status: AC
  Administered 2017-03-14: 15:00:00
  Filled 2017-03-14: qty 1

## 2017-03-14 MED ORDER — SODIUM CHLORIDE 0.9 % IV SOLN
250.0000 mL | INTRAVENOUS | Status: DC | PRN
Start: 2017-03-14 — End: 2017-03-19

## 2017-03-14 MED ORDER — ASPIRIN EC 81 MG PO TBEC
81.0000 mg | DELAYED_RELEASE_TABLET | Freq: Every day | ORAL | Status: DC
  Administered 2017-03-14 – 2017-03-19 (×6): 81 mg via ORAL
  Filled 2017-03-14 (×6): qty 1

## 2017-03-14 MED ORDER — SODIUM CHLORIDE 0.9% FLUSH: 3.0000 mL | INTRAVENOUS | Status: DC | PRN

## 2017-03-14 MED ORDER — MAGNESIUM HYDROXIDE 400 MG/5ML PO SUSP
30.0000 mL | Freq: Every day | ORAL | Status: DC | PRN
  Administered 2017-03-14 – 2017-03-16 (×2): 30 mL via ORAL
  Filled 2017-03-14 (×2): qty 30

## 2017-03-14 MED ORDER — PERFLUTREN LIPID MICROSPHERE
1.0000 mL | INTRAVENOUS | Status: AC | PRN
  Administered 2017-03-14: 2 mL via INTRAVENOUS
  Filled 2017-03-14: qty 10

## 2017-03-14 NOTE — Progress Notes (Signed)
Inpatient Diabetes Program Recommendations  AACE/ADA: New Consensus Statement on Inpatient Glycemic Control (2015)  Target Ranges:  Prepandial:   less than 140 mg/dL      Peak postprandial:   less than 180 mg/dL (1-2 hours)      Critically ill patients:  140 - 180 mg/dL   Lab Results  Component Value Date   GLUCAP 85 03/13/2017   HGBA1C 6.9 (H) 03/13/2017    Review of Glycemic Control  Diabetes history: No prior DM hx Current orders for Inpatient glycemic control: None  Inpatient Diabetes Program Recommendations:   Spoke with pt @ bedside about new diagnosis. Discussed A1C 6.9 results with them and explained what an A1C is, basic pathophysiology of DM Type 2, basic home care, basic diabetes diet nutrition principles, importance of checking CBGs and maintaining good CBG control to prevent long-term and short-term complications. Reviewed signs and symptoms of hyperglycemia and hypoglycemia and how to treat hypoglycemia at home. Also reviewed blood sugar goals at home.  RNs to provide ongoing basic DM education at bedside with this patient. Have ordered educational booklet and DM videos. Have also placed RD consult for DM diet education for this patient. Patient shared that she has been drinking some drinks with sugar and willing to make dietary changes of decreasing carbohydrates.  Patient is interested in attending outpatient diabetes classes. Please: -Add DM 2 to diagnosis in chart to be able to refer for outpatient education -Glycemic control order set Novolog correction 0-9 units tid -Meter kit [id number] on D/C  Thank you, Theresa Kirby. Theresa Braud, RN, MSN, CDE  Diabetes Coordinator Inpatient Glycemic Control Team Team Pager [phone] (8am-5pm) 03/14/2017 3:03 PM

## 2017-03-14 NOTE — Progress Notes (Signed)
Nutrition Education Note  RD consulted for nutrition education regarding new onset CHF and Diabetes.  RD provided "Low Sodium Nutrition Therapy" handout from the Academy of Nutrition and Dietetics. Reviewed patient's dietary recall. Provided examples on ways to decrease sodium intake in diet. Discouraged intake of processed foods and use of salt shaker. Encouraged fresh fruits and vegetables as well as whole grain sources of carbohydrates to maximize fiber intake. RD discussed why it is important for patient to adhere to diet recommendations, and emphasized the role of fluids, foods to avoid, and importance of weighing self daily.  RD provided "Carbohydrate Counting for People with Diabetes" handout from the Academy of Nutrition and Dietetics. Discussed different food groups and their effects on blood sugar, emphasizing carbohydrate-containing foods. Provided list of carbohydrates and recommended serving sizes of common foods.Discussed importance of controlled and consistent carbohydrate intake throughout the day. Provided examples of ways to balance meals/snacks and encouraged intake of high-fiber, whole grain complex carbohydrates.  Teach back method used.  Lab Results  Component Value Date   HGBA1C 6.9 (H) 03/13/2017   Expect fair compliance.  Body mass index is 59.68 kg/m. Pt meets criteria for morbidly obese based on current BMI.  Current diet order is heart healthy, patient is consuming approximately 100% of meals at this time. Labs and medications reviewed. No further nutrition interventions warranted at this time. RD contact information provided. If additional nutrition issues arise, please re-consult RD.  Mariana Single RD, LDN Clinical Nutrition Pager # [phone]

## 2017-03-14 NOTE — Care Management Note (Signed)
Case Management Note  Patient Details  Name: Theresa Kirby MRN: [id number] Date of Birth: [birth year]  Subjective/Objective:  Admitted with Acute Respiratory Failure                 Action/Plan: Patient lives at home alone; PCP: Ramichandran; has private insurance with BCBS with prescription drug coverage; pharmacy of choice is [facility]; DME- CPAP, home oxygen,cane at home; she is planning on going to [place] to purchase scales to weigh herself daily; [place] choice offered, patient chose Fannie Knee with [facility] called for arrangements.  Expected Discharge Date:     Possibly 03/17/2017             Expected Discharge Plan:  [place]  Discharge planning Services  CM Consult  Choice offered to:  Patient  HH Arranged:  PT HH Agency:  [place]  Status of Service:  In process, will continue to follow  Sherrilyn Rist [phone] 03/14/2017, 11:11 AM

## 2017-03-14 NOTE — Consult Note (Signed)
Cardiology Consultation:   Patient ID: ABBRIELLE BATTS; [id number]; 12-Jul-1956   Admit date: 03/13/2017 Date of Consult: 03/14/2017  Primary Care Provider: Patient, No Pcp Per Primary Cardiologist: New to Dr. Marlou Porch  Chief Complaint: shortness of breath  Patient Profile:   MOZELLE REMLINGER is a 61 y.o. female with a hx of morbid obesity, OSA on CPAP (noncompliant), arthritis, hypothyroidism, asthma, iron deficiency anemia, non-traumatic rhabdo of RLE 2017, A1C 6.9 (without prior dx of DM) who is being seen today for the evaluation of CHF at the request of Dr. Lorin Mercy.  History of Present Illness:   Ms. Tena has no prior cardiac history and has not had any prior w/u. She reports longstanding struggle with her weight which she attributes to thyroid problems. She has been noticing worsening SOB over the last several weeks to the point where she has been getting dyspneic with even minimal activities. She initially felt this was due to her asthma but when she began having panting episodes, she knew something was not right. She does not follow her weight at home but upon admission realized she was up 58 lbs compared to 2017. She also reports abdominal distention, LEE, facial swelling, and orthopnea. No chest pain, palpitations, syncope or bleeding. Mother had CHF so she knows a little bit about it. Due to persistent sx she presented to her PCP where she was sent to the ER as she was found to be hypoxic at 82% RA. Labs notable for BNP 210, troponin negative, BUN 23, Cr 0.94, LDL 90, A1C 6.9, TSH 5.971, troponin negative.. CXR concerning for CHF with bilateral pulm interstitial edema. She received 40mg  IV Lasix in the ER and was written for 5mg  of lisinopril and 20mg  BID of IV Lasix to start this AM. Blood pressures have been variable from 117/57 to 150/76. She reports good UOP yesterday, I/O's recorded as -1500 with weight 358->347 (unclear accuracy). Last weight in 05/2016 was 297lb with BMI 50 at that  time.   Past Medical History:  Diagnosis Date  . Arthritis    knees  . Asthma   . Iron deficiency anemia   . Morbid obesity ([place])   . Rhabdomyolysis 04/2016   normal compartment pressures  . Sleep apnea    uses cpap, setting is unknown, does not wear it consistently  . Thyroid disease     Past Surgical History:  Procedure Laterality Date  . CESAREAN SECTION    . COLONOSCOPY WITH PROPOFOL N/A 07/21/2015   Procedure: COLONOSCOPY WITH PROPOFOL;  Surgeon: Juanita Craver, MD;  Location: [facility] ENDOSCOPY;  Service: Endoscopy;  Laterality: N/A;  . DILATION AND CURETTAGE OF UTERUS    . ENDOMETRIAL ABLATION    . GANGLION CYST EXCISION     rt arm  . SHOULDER SURGERY Left    Rotator cuff repair     Inpatient Medications: Scheduled Meds: . aspirin EC  81 mg Oral Daily  . enoxaparin (LOVENOX) injection  40 mg Subcutaneous Q24H  . furosemide  40 mg Intravenous BID  . Living Better with Heart Failure Book   Does not apply Once  . sodium chloride flush  3 mL Intravenous Q12H   Continuous Infusions: . sodium chloride     PRN Meds: sodium chloride, acetaminophen, albuterol, montelukast, ondansetron (ZOFRAN) IV, sodium chloride flush  Allergies:    Allergies  Allergen Reactions  . Sulfa Antibiotics Hives and Shortness Of Breath    Social History:   Social History   Social History  . Marital  status: Single    Spouse name: N/A  . Number of children: N/A  . Years of education: N/A   Occupational History  . 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 Smoking status: Never Smoker  . Smokeless tobacco: Never Used  . Alcohol use No  . Drug use: No  . Sexual activity: Not Currently    Birth control/ protection: None   Other Topics Concern  . Not on file   Social History Narrative  . No narrative on file    Family History:   The patient's family history includes Heart disease in her maternal grandmother and mother; Heart failure in her mother;  Hypertension in her brother and maternal grandmother.  ROS:  Please see the history of present illness.  All other ROS reviewed and negative.     Physical Exam/Data:   Vitals:   03/13/17 2230 03/14/17 0002 03/14/17 0604 03/14/17 0805  BP: (!) 149/78 134/62 (!) 117/57 (!) 142/81  Pulse: 64 (!) 59 63 (!) 52  Resp:  20    Temp:  98.8 F (37.1 C) 98.2 F (36.8 C) 97.6 F (36.4 C)  TempSrc:  Oral Oral Oral  SpO2: 95% 97% 97% 93%  Weight:  (!) 347 lb 11.2 oz (157.7 kg)    Height:  5\' 4"  (1.626 m)      Intake/Output Summary (Last 24 hours) at 03/14/17 0912 Last data filed at 03/14/17 0858  Gross per 24 hour  Intake                0 ml  Output             1500 ml  Net            -1500 ml   Filed Weights   03/13/17 1555 03/14/17 0002  Weight: (!) 358 lb (162.4 kg) (!) 347 lb 11.2 oz (157.7 kg)   Body mass index is 59.68 kg/m.  General: Well developed morbidly obese AAF in no acute distress. Head: Normocephalic, atraumatic, sclera non-icteric, no xanthomas, nares are without discharge.  Neck: Negative for carotid bruits. JVD difficult to assess given thick neck habitus Lungs: Diffusely diminished throughout wheezes, rales, or rhonchi. Breathing is unlabored. Heart: RRR with S1 S2. No murmurs, rubs, or gallops appreciated. Abdomen: Soft, non-tender, non-distended with normoactive bowel sounds. No hepatomegaly. No rebound/guarding. No obvious abdominal masses. Msk:  Strength and tone appear normal for age. Extremities: No clubbing or cyanosis. 1-2+ BLE edema up to knees, pitting, somewhat challenging to grade given large baseline leg habitus  Distal pedal pulses are 2+ and equal bilaterally. Neuro: Alert and oriented X 3. No facial asymmetry. No focal deficit. Moves all extremities spontaneously. Psych:  Responds to questions appropriately with a normal affect.  EKG:  The EKG was personally reviewed and demonstrates NSR 58bpm, NSIVCD nonspecific ST-T changes   Relevant CV  Studies: Pending  Laboratory Data:  Chemistry Recent Labs Lab 03/13/17 1618 03/14/17 0427  NA 141 143  K 4.6 4.1  CL 101 99*  CO2 32 40*  GLUCOSE 93 117*  BUN 23* 19  CREATININE 0.94 0.99  CALCIUM 8.8* 8.6*  GFRNONAA >60 >60  GFRAA >60 >60  ANIONGAP 8 4*     Recent Labs Lab 03/13/17 1618  PROT 6.5  ALBUMIN 3.3*  AST 32  ALT 19  ALKPHOS 39  BILITOT 0.9   Hematology Recent Labs Lab 03/13/17 1618 03/14/17 0427  WBC 7.3 6.7  RBC 5.32* 5.48*  HGB 13.9 13.9  HCT 45.5 47.8*  MCV 85.5 87.2  MCH 26.1 25.4*  MCHC 30.5 29.1*  RDW 17.0* 17.2*  PLT 223 219   Cardiac EnzymesNo results for input(s): TROPONINI in the last 168 hours.  Recent Labs Lab 03/13/17 1624  TROPIPOC 0.01    BNP Recent Labs Lab 03/13/17 1618  BNP 210.2*    DDimer   Radiology/Studies:  Dg Chest Port 1 View  Result Date: 03/13/2017 CLINICAL DATA:  Worsening shortness of breath. EXAM: PORTABLE CHEST 1 VIEW COMPARISON:  CT 05/07/2016.  Chest x-ray 05/05/2016 . FINDINGS: Cardiomegaly with bilateral pulmonary interstitial prominence consistent with congestive heart failure. No pleural effusion or pneumothorax. IMPRESSION: Congestive heart failure with bilateral pulmonary interstitial edema. Electronically Signed   By: Marcello Moores  Register   On: 03/13/2017 16:33    Assessment and Plan:   1. Acute congestive heart failure (type not yet determined) with acute hypoxic respiratory failure - clinical assessment is somewhat challenging due to her obesity but I suspect she is markedly volume overloaded with pitting edema all the way up to her knees. 2D echo is pending. BNP is only minimally elevated but I suspect this is falsely low due to her edema. She is ordered for 20mg  BID of Lasix and lisinopril 5mg  daily. I favor holding off on the ACEI so that we can aggressively diurese without lowering blood pressure. Can always add at a later time if needed. Increase Lasix to 40mg  BID. Check UA to assess for  proteinuria as a compounding factor to her edema given albumin decreased on CMET. Discussed daily weights, 2g sodium diet, 2L fluid restriction. Dietician consult also pending to discuss diet further in detail. Will rx Living Better with CHF book. We also discussed the profound impact her weight has on this condition as well. EKG shows nonspecific TW changes but she has not been describing any chest pain - await EF results.  2. Morbid obesity - given her presentation with hypoxia and acute CHF, I believe her morbid obesity has become life threatening. She reports longstanding struggle with weight and has contemplated bariatric options in the past. Dry weight is not yet known. Would recommend referral to bariatric program as OP to discuss options for treatment. Will need assessment of home O2 needs prior to dc in case there is component of OHS.  3. Elevated blood pressure without prior diagnosis of HTN - follow with diuresis.  4. Obstructive sleep apnea - discussed rationale for CPAP and that the only other alternative thereafter would be long-term weight loss to resolve the condition. Will order CPAP here QHS.  5. Hypothyroidism - TSH abnormal; per IM.  6. Newly recognized diabetes mellitus - per IM.   7. Pulmonary nodule - doubt this is acutely related to active admission but in 04/2016 she was noted to have 1.4cm RLL nodule with recommendation to repeat chest CT or consider PET/biopsy. Will defer further eval to IM.  Signed, Charlie Pitter, PA-C  03/14/2017 9:12 AM   Personally seen and examined. Agree with above.  61 year old female with super morbid obesity here with symptoms of acute diastolic heart failure. Denies any chest pain. Positive shortness of breath, orthopnea. Obese, alert, regular rate and rhythm, unable to determine JVD, lungs with mild crackles at the bases. Chronic appearing lower extremity edema.  Acute presumed diastolic heart failure  - Await echocardiogram  - Increasing  Lasix to 40 mg IV twice a day. Watch renal function. Watch potassium.  -  Understands that weight is also playing a role in her symptomatology.  - Holding ACE inhibitor while we continue with aggressive diuresis.  Morbid obesity  - Continue to encourage weight loss. She has entertained the thought of bariatric surgery in the past.  Essential hypertension  - Issued improved with decreased volume.  OSA  - CPAP  Candee Furbish, MD

## 2017-03-14 NOTE — Progress Notes (Signed)
PROGRESS NOTE    Theresa Kirby  VOJ:[id number] DOB: 31-Mar-1956 DOA: 03/13/2017 PCP: Patient, No Pcp Per    Brief Narrative: Theresa Kirby is a 61 y.o. female with medical history significant of hypothyroidism; OSA on CPAP sometimes; asthma; and arthritis presenting with dyspnea on exertion. She is admitted for acute CHF.   Assessment & Plan:   Principal Problem:   Acute respiratory failure with hypoxia (HCC) Active Problems:   Morbid obesity (HCC)   CHF exacerbation (HCC)   Hypothyroidism   OSA (obstructive sleep apnea)   Elevated blood pressure reading without diagnosis of hypertension   Acute respiratory failure with hypoxia secondary to new onset congestive heart failure, unknown EF.  Admitted to telemetry for IV diuresis with IV lasix 40 mg BID.  Diuresed about 2 lit since admission. She reports feeling a little better , but not back to baseline.  Continue with strict intake and output and daily weights.  Echocardiogram ordered and cardiology consulted for further recommendations.   Monitor renal parameters and potassium while on IV lasix.  poc troponin is negative.  LDL is 90 and HDL is 40.    Hypertension: well controlled. Off Ace inhibitor.    Obesity:  Outpatient follow up.    OSA:  CPAP at night.    Hypothyroidism:  Resume synthroid. tsh is 5.9, get free t4 levels.    Hyperglycemia: A1c is 6.9, new onset DM.  Will get DM co ordinator.  Will need to start on meds on discharge.           DVT prophylaxis: Lovenox.  Code Status: full code.  Family Communication: none at bedside.  Disposition Plan: (pending further evaluation by cardiology and resolution of CHF.    Consultants:   Cardiology.    Procedures: echocardiogram.    Antimicrobials: none.    Subjective: Reports breathing is better, not back to baseline .   Objective: Vitals:   03/14/17 0002 03/14/17 0604 03/14/17 0805 03/14/17 1000  BP: 134/62 (!) 117/57 (!) 142/81  116/65  Pulse: (!) 59 63 (!) 52   Resp: 20  20   Temp: 98.8 F (37.1 C) 98.2 F (36.8 C) 97.6 F (36.4 C)   TempSrc: Oral Oral Oral   SpO2: 97% 97% 93%   Weight: (!) 157.7 kg (347 lb 11.2 oz)     Height: 5\' 4"  (1.626 m)       Intake/Output Summary (Last 24 hours) at 03/14/17 1223 Last data filed at 03/14/17 1100  Gross per 24 hour  Intake              480 ml  Output             2800 ml  Net            -2320 ml   Filed Weights   03/13/17 1555 03/14/17 0002  Weight: (!) 162.4 kg (358 lb) (!) 157.7 kg (347 lb 11.2 oz)    Examination:  General exam: Appears calm and comfortable  Respiratory system: Clear to auscultation. Respiratory effort normal. Cardiovascular system: S1 & S2 heard, RRR. No JVD, murmurs, rubs, gallops or clicks. Gastrointestinal system: Abdomen is nondistended, soft and nontender. No organomegaly or masses felt. Normal bowel sounds heard. Central nervous system: Alert and oriented. No focal neurological deficits. Extremities: Symmetric 5 x 5 power.2+ leg edema.  Skin: No rashes, lesions or ulcers Psychiatry: Judgement and insight appear normal. Mood & affect appropriate.     Data Reviewed: I have personally reviewed  following labs and imaging studies  CBC:  Recent Labs Lab 03/13/17 1618 03/14/17 0427  WBC 7.3 6.7  NEUTROABS 5.1 4.6  HGB 13.9 13.9  HCT 45.5 47.8*  MCV 85.5 87.2  PLT 223 161   Basic Metabolic Panel:  Recent Labs Lab 03/13/17 1618 03/14/17 0427  NA 141 143  K 4.6 4.1  CL 101 99*  CO2 32 40*  GLUCOSE 93 117*  BUN 23* 19  CREATININE 0.94 0.99  CALCIUM 8.8* 8.6*   GFR: Estimated Creatinine Clearance: 91.5 mL/min (by C-G formula based on SCr of 0.99 mg/dL). Liver Function Tests:  Recent Labs Lab 03/13/17 1618  AST 32  ALT 19  ALKPHOS 39  BILITOT 0.9  PROT 6.5  ALBUMIN 3.3*    Recent Labs Lab 03/13/17 1618  LIPASE 20   No results for input(s): AMMONIA in the last 168 hours. Coagulation Profile: No results  for input(s): INR, PROTIME in the last 168 hours. Cardiac Enzymes: No results for input(s): CKTOTAL, CKMB, CKMBINDEX, TROPONINI in the last 168 hours. BNP (last 3 results) No results for input(s): PROBNP in the last 8760 hours. HbA1C:  Recent Labs  03/13/17 2358  HGBA1C 6.9*   CBG:  Recent Labs Lab 03/13/17 1641  GLUCAP 85   Lipid Profile:  Recent Labs  03/14/17 0427  CHOL 151  HDL 40*  LDLCALC 90  TRIG 107  CHOLHDL 3.8   Thyroid Function Tests:  Recent Labs  03/13/17 2358  TSH 5.971*   Anemia Panel: No results for input(s): VITAMINB12, FOLATE, FERRITIN, TIBC, IRON, RETICCTPCT in the last 72 hours. Sepsis Labs: No results for input(s): PROCALCITON, LATICACIDVEN in the last 168 hours.  No results found for this or any previous visit (from the past 240 hour(s)).       Radiology Studies: Dg Chest Port 1 View  Result Date: 03/13/2017 CLINICAL DATA:  Worsening shortness of breath. EXAM: PORTABLE CHEST 1 VIEW COMPARISON:  CT 05/07/2016.  Chest x-ray 05/05/2016 . FINDINGS: Cardiomegaly with bilateral pulmonary interstitial prominence consistent with congestive heart failure. No pleural effusion or pneumothorax. IMPRESSION: Congestive heart failure with bilateral pulmonary interstitial edema. Electronically Signed   By: [place]   On: 03/13/2017 16:33        Scheduled Meds: . aspirin EC  81 mg Oral Daily  . chlorhexidine  15 mL Mouth Rinse BID  . [START ON 03/15/2017] enoxaparin (LOVENOX) injection  0.5 mg/kg Subcutaneous Q24H  . furosemide  40 mg Intravenous BID  . mouth rinse  15 mL Mouth Rinse q12n4p  . sodium chloride flush  3 mL Intravenous Q12H   Continuous Infusions: . sodium chloride       LOS: 1 day    Time spent: 35 minutes.     Hosie Poisson, MD [facility] Pager [phone]  If 7PM-7AM, please contact night-coverage [email or web address] Password [facility] 03/14/2017, 12:23 PM

## 2017-03-14 NOTE — Evaluation (Signed)
Physical Therapy Evaluation Patient Details Name: Theresa Kirby MRN: [id number] DOB: 12/18/1955 Today's Date: 03/14/2017   History of Present Illness  Pt is a 61 yo female admitted from physicians office with c/o SoB and weight gain, SaO2 on RA 82% O2. Dx with CHF. Prior medical history significant of hypothyroidism; OSA on CPAP sometimes; asthma; and arthritis  Clinical Impression  Pt admitted with above diagnosis. Pt currently with functional limitations due to the deficits listed below (see PT Problem List). Pt is currently limited by O2 desaturation and dizziness with gait. Pt is independent with bed mobility, mod I with transfers and minA due to 1xLoB  for ambulation of 18 feet with SPC.  Pt will benefit from skilled PT to increase their independence and safety with mobility to allow discharge to the venue listed below.       Follow Up Recommendations Home health PT;Supervision - Intermittent    Equipment Recommendations  None recommended by PT    Recommendations for Other Services       Precautions / Restrictions Precautions Precautions: None Restrictions Weight Bearing Restrictions: No      Mobility  Bed Mobility Overal bed mobility: Independent                Transfers Overall transfer level: Modified independent Equipment used: Straight cane             General transfer comment: good power up and steadying with cane  Ambulation/Gait Ambulation/Gait assistance: Min assist Ambulation Distance (Feet): 18 Feet Assistive device: Straight cane Gait Pattern/deviations: Shuffle;Decreased stance time - right Gait velocity: slowed Gait velocity interpretation: Below normal speed for age/gender General Gait Details: minA for steadying with 1xLoB, vc for proper cane use and for upright posture with gait, pt reports increased dizziness with gait and returned to recliner        Balance Overall balance assessment: Needs assistance Sitting-balance support: No  upper extremity supported Sitting balance-Leahy Scale: Normal     Standing balance support: No upper extremity supported Standing balance-Leahy Scale: Fair Standing balance comment: uses cane for steadying assist                             Pertinent Vitals/Pain Pain Assessment: 0-10 Pain Score: 3  Pain Location: R ankle with ambulation Pain Descriptors / Indicators: Sore Pain Intervention(s): Monitored during session    Home Living Family/patient expects to be discharged to:: Private residence Living Arrangements: Alone Available Help at Discharge: Available PRN/intermittently;Friend(s) Type of Home: Apartment Home Access: Level entry     Home Layout: One level Home Equipment: Walker - 2 wheels;Cane - single point      Prior Function Level of Independence: Independent               Hand Dominance   Dominant Hand: Right    Extremity/Trunk Assessment   Upper Extremity Assessment Upper Extremity Assessment: Overall WFL for tasks assessed    Lower Extremity Assessment Lower Extremity Assessment: RLE deficits/detail;Generalized weakness (ROM of LE joints limited by body habitus) RLE Deficits / Details: R ankle ROM limited from previous work related injury, pt wears orthotic for ambulation,        Communication   Communication: No difficulties  Cognition Arousal/Alertness: Awake/alert  General Comments General comments (skin integrity, edema, etc.): Pt on 3L/min O2 via nasal cannula throughout session, at rest SaO2 91%O2 with activity SaO2 dropped to 82%O2 , pt instructed in pursed lipped breathing and need to breathe through her nose with O2, SaO2 rebounded to 92%O2 within 30 seconds          Assessment/Plan    PT Assessment Patient needs continued PT services  PT Problem List Decreased strength;Decreased range of motion;Decreased activity tolerance;Decreased balance;Decreased  mobility;Decreased knowledge of use of DME;Cardiopulmonary status limiting activity;Obesity       PT Treatment Interventions DME instruction;Gait training;Functional mobility training;Therapeutic activities;Therapeutic exercise;Balance training;Patient/family education    PT Goals (Current goals can be found in the Care Plan section)  Acute Rehab PT Goals Patient Stated Goal: go home PT Goal Formulation: With patient Time For Goal Achievement: 03/21/17 Potential to Achieve Goals: Good    Frequency Min 3X/week    AM-PAC PT "6 Clicks" Daily Activity  Outcome Measure Difficulty turning over in bed (including adjusting bedclothes, sheets and blankets)?: None Difficulty moving from lying on back to sitting on the side of the bed? : None Difficulty sitting down on and standing up from a chair with arms (e.g., wheelchair, bedside commode, etc,.)?: None Help needed moving to and from a bed to chair (including a wheelchair)?: None Help needed walking in hospital room?: A Little Help needed climbing 3-5 steps with a railing? : A Lot 6 Click Score: 21    End of Session Equipment Utilized During Treatment: Gait belt;Oxygen Activity Tolerance: Other (comment) (limited by O2 desaturation) Patient left: in chair;with call bell/phone within reach;with chair alarm set Nurse Communication: Mobility status PT Visit Diagnosis: Unsteadiness on feet (R26.81);Other abnormalities of gait and mobility (R26.89);Muscle weakness (generalized) (M62.81);Dizziness and giddiness (R42)    Time: 7654-6503 PT Time Calculation (min) (ACUTE ONLY): 30 min   Charges:   PT Evaluation $PT Eval Moderate Complexity: 1 Mod PT Treatments $Gait Training: 8-22 mins   PT G Codes:        Sanoe Hazan B. Migdalia Dk PT, DPT Acute Rehabilitation  [phone] Pager [phone]    [place] 03/14/2017, 9:45 AM

## 2017-03-14 NOTE — Progress Notes (Signed)
  Echocardiogram 2D Echocardiogram has been performed.  Theresa Kirby 03/14/2017, 5:12 PM

## 2017-03-14 NOTE — Progress Notes (Signed)
CPAP is in the room. PT stated that she wanted to wear it at the begging of night but later refused. Will continue to monitor throughout the night.

## 2017-03-14 NOTE — Progress Notes (Signed)
OT Cancellation Note  Patient Details Name: Theresa Kirby MRN: [id number] DOB: [birth year]   Cancelled Treatment:    Reason Eval/Treat Not Completed: Patient at procedure or test/ unavailable; will check back as schedule permits.   Lou Cal, OT Pager [phone] 03/14/2017   Raymondo Band 03/14/2017, 4:31 PM

## 2017-03-15 DIAGNOSIS — I509 Heart failure, unspecified: Secondary | ICD-10-CM

## 2017-03-15 LAB — BASIC METABOLIC PANEL
Anion gap: 9 (ref 5–15)
BUN: 20 mg/dL (ref 6–20)
CALCIUM: 9 mg/dL (ref 8.9–10.3)
CO2: 40 mmol/L — AB (ref 22–32)
Chloride: 92 mmol/L — ABNORMAL LOW (ref 101–111)
Creatinine, Ser: 0.94 mg/dL (ref 0.44–1.00)
GFR calc Af Amer: >60 mL/min (ref >60)
GLUCOSE: 119 mg/dL — AB (ref 65–99)
Potassium: 4.4 mmol/L (ref 3.5–5.1)
Sodium: 141 mmol/L (ref 135–145)

## 2017-03-15 LAB — GLUCOSE, CAPILLARY: Glucose-Capillary: 142 mg/dL — ABNORMAL HIGH (ref 65–99)

## 2017-03-15 LAB — MAGNESIUM: Magnesium: 2.3 mg/dL (ref 1.7–2.4)

## 2017-03-15 MED ORDER — INSULIN ASPART 100 UNIT/ML ~~LOC~~ SOLN
0.0000 [IU] | Freq: Three times a day (TID) | SUBCUTANEOUS | Status: DC
  Administered 2017-03-16 – 2017-03-17 (×2): 2 [IU] via SUBCUTANEOUS
  Administered 2017-03-17: 3 [IU] via SUBCUTANEOUS
  Administered 2017-03-18 – 2017-03-19 (×6): 2 [IU] via SUBCUTANEOUS

## 2017-03-15 NOTE — Evaluation (Signed)
Occupational Therapy Evaluation Patient Details Name: Theresa Kirby MRN: [id number] DOB: 24-Nov-1955 Today's Date: 03/15/2017    History of Present Illness Pt is a 61 yo female admitted from physicians office with c/o SoB and weight gain, SaO2 on RA 82% O2. Dx with CHF. Prior medical history significant of hypothyroidism; OSA on CPAP sometimes; asthma; and arthritis   Clinical Impression   This 61 y/o F presents with the above. Pt lives alone, at baseline is mod-independent with ADLs and functional mobility using SPC, and was driving. Pt currently requires MinA for functional mobility using SPC, MinA for LB ADLs. Pt currently on 3L O2, sats decreasing to upper 80's with minimal activity, quickly returns to above 90% with deep breathing techniques. Pt will benefit from continued acute and post acute OT services to maximize Pt's activity tolerance, increase knowledge of energy conservation techniques, and to increase safety and independence with ADLs and functional mobility.      Follow Up Recommendations  Home health OT;Supervision - Intermittent    Equipment Recommendations  3 in 1 bedside commode;Other (comment) (bariatric 3:1 BSC )           Precautions / Restrictions Precautions Precautions: None Restrictions Weight Bearing Restrictions: No      Mobility Bed Mobility Overal bed mobility: Needs Assistance Bed Mobility: Supine to Sit     Supine to sit: Supervision;HOB elevated     General bed mobility comments: supervision for safety   Transfers Overall transfer level: Needs assistance Equipment used: Straight cane Transfers: Sit to/from Stand Sit to Stand: Min guard         General transfer comment: MinGuard for safety     Balance Overall balance assessment: Needs assistance Sitting-balance support: No upper extremity supported Sitting balance-Leahy Scale: Normal     Standing balance support: Single extremity supported Standing balance-Leahy Scale:  Fair Standing balance comment: uses cane for steadying assist                           ADL either performed or assessed with clinical judgement   ADL Overall ADL's : Needs assistance/impaired Eating/Feeding: Set up;Sitting   Grooming: Set up;Sitting   Upper Body Bathing: Min guard;Sitting   Lower Body Bathing: Sit to/from stand;Minimal assistance   Upper Body Dressing : Min guard;Sitting   Lower Body Dressing: Minimal assistance;Sit to/from stand Lower Body Dressing Details (indicate cue type and reason): Pt dons shoes with close guard for safety; educated Pt on propping LE onto bed (vs bending towards foot) for energy conservation/decrease SOB  Toilet Transfer: Ambulation;BSC;Minimal assistance Toilet Transfer Details (indicate cue type and reason): simulated in bed to chair transfer, using SPC  Toileting- Clothing Manipulation and Hygiene: Sit to/from stand;Min guard       Functional mobility during ADLs: Cane;Minimal assistance General ADL Comments: Pt without RW in room this session, used SPC for room level mobility around bedside to transfer to recliner (completes with MinA); Pt with increased dyspnea and noted O2 sats dropping below 90% on 3L O2 with activity, increased to above 90% with instructions on deep breathing within approx 30 seconds; education provided on energy conservation techniques during functional task completion      Vision Baseline Vision/History: Wears glasses Wears Glasses: At all times                  Pertinent Vitals/Pain Pain Assessment: Faces Faces Pain Scale: No hurt     Hand Dominance Right   Extremity/Trunk  Assessment Upper Extremity Assessment Upper Extremity Assessment: Generalized weakness (increased shakiness noted in RUE when extending forward )   Lower Extremity Assessment Lower Extremity Assessment: Defer to PT evaluation       Communication Communication Communication: No difficulties   Cognition  Arousal/Alertness: Awake/alert Behavior During Therapy: WFL for tasks assessed/performed Overall Cognitive Status: Within Functional Limits for tasks assessed                                                      Home Living Family/patient expects to be discharged to:: Private residence Living Arrangements: Alone Available Help at Discharge: Available PRN/intermittently;Friend(s);Family;Other (Comment) (reports she has family who can stay overnight ) Type of Home: Apartment Home Access: Level entry     Home Layout: One level     Bathroom Shower/Tub: Tub/shower unit;Curtain   Bathroom Toilet: Standard Bathroom Accessibility: Yes   Home Equipment: Walker - 2 wheels;Cane - single point          Prior Functioning/Environment Level of Independence: Independent with assistive device(s);Independent        Comments: drives; uses SPC for mobility          OT Problem List: Decreased strength;Cardiopulmonary status limiting activity;Decreased activity tolerance;Obesity      OT Treatment/Interventions: Self-care/ADL training;DME and/or AE instruction;Therapeutic activities;Balance training;Therapeutic exercise;Energy conservation;Patient/family education    OT Goals(Current goals can be found in the care plan section) Acute Rehab OT Goals Patient Stated Goal: go home OT Goal Formulation: With patient Time For Goal Achievement: 03/29/17 Potential to Achieve Goals: Good  OT Frequency: Min 2X/week                             AM-PAC PT "6 Clicks" Daily Activity     Outcome Measure Help from another person eating meals?: None Help from another person taking care of personal grooming?: A Little Help from another person toileting, which includes using toliet, bedpan, or urinal?: A Little Help from another person bathing (including washing, rinsing, drying)?: A Little Help from another person to put on and taking off regular upper body clothing?: A  Little Help from another person to put on and taking off regular lower body clothing?: A Little 6 Click Score: 19   End of Session Equipment Utilized During Treatment: Gait belt;Other (comment);Oxygen [facility]) Nurse Communication: Mobility status  Activity Tolerance: Patient tolerated treatment well Patient left: in chair;with call bell/phone within reach;with chair alarm set  OT Visit Diagnosis: Muscle weakness (generalized) (M62.81)                Time: 2542-7062 OT Time Calculation (min): 23 min Charges:  OT General Charges $OT Visit: 1 Procedure OT Evaluation $OT Eval Low Complexity: 1 Procedure G-Codes:     Lou Cal, OT Pager [phone] 03/15/2017   Raymondo Band 03/15/2017, 9:57 AM

## 2017-03-15 NOTE — Progress Notes (Signed)
I stopped in to review HF recommendations for home.  Patient was sound asleep and I will return at another time.

## 2017-03-15 NOTE — Progress Notes (Signed)
Attempted to wean oxygen.  On 2L patient oxygen saturation 86%.  Returned patient to 3L oxygen and oxygen saturation 93-95%.

## 2017-03-15 NOTE — Progress Notes (Signed)
Progress Note  Patient Name: Theresa Kirby Date of Encounter: 03/15/2017  Primary Cardiologist:  New to Dr. Marlou Porch  Subjective   She is feeling better but she still drops her O2 sats when she takes off her O2. She is having cramping in her hands.    Inpatient Medications    Scheduled Meds: . aspirin EC  81 mg Oral Daily  . chlorhexidine  15 mL Mouth Rinse BID  . enoxaparin (LOVENOX) injection  0.5 mg/kg Subcutaneous Q24H  . furosemide  40 mg Intravenous BID  . mouth rinse  15 mL Mouth Rinse q12n4p  . sodium chloride flush  3 mL Intravenous Q12H   Continuous Infusions: . sodium chloride     PRN Meds: sodium chloride, acetaminophen, albuterol, magnesium hydroxide, montelukast, ondansetron (ZOFRAN) IV, sodium chloride flush   Vital Signs    Vitals:   03/15/17 0049 03/15/17 0256 03/15/17 0611 03/15/17 0900  BP: 134/69  135/89 137/66  Pulse: 64  73 68  Resp: 20  20 20   Temp: 98.3 F (36.8 C)  98.6 F (37 C) 98.4 F (36.9 C)  TempSrc: Oral  Oral Oral  SpO2: 98%  98% 93%  Weight:  (!) 344 lb 1.6 oz (156.1 kg)    Height:        Intake/Output Summary (Last 24 hours) at 03/15/17 0913 Last data filed at 03/15/17 0902  Gross per 24 hour  Intake             1440 ml  Output             4825 ml  Net            -3385 ml   Filed Weights   03/13/17 1555 03/14/17 0002 03/15/17 0256  Weight: (!) 358 lb (162.4 kg) (!) 347 lb 11.2 oz (157.7 kg) (!) 344 lb 1.6 oz (156.1 kg)    Telemetry    Sinus rhythm - Personally Reviewed   Physical Exam   GEN: Well nourished, well developed, obese HEENT: normal  Neck: Unable to assess JVD because of body habitus, carotid bruits, or masses Cardiac: RRR. no murmurs, rubs, or gallops  Intact distal pulses bilaterally.  Respiratory: nasal cannula in place, decreased breathing sounds. Able to speak in full sentences GI: soft, nontender, nondistended, + BS MS: no deformity or atrophy, + lower extremity edema. Skin: warm and dry, no  rash Neuro: Alert and Oriented x 3, Strength and sensation are intact Psych:   Full affect  Labs    Chemistry Recent Labs Lab 03/13/17 1618 03/14/17 0427 03/15/17 0324  NA 141 143 141  K 4.6 4.1 4.4  CL 101 99* 92*  CO2 32 40* 40*  GLUCOSE 93 117* 119*  BUN 23* 19 20  CREATININE 0.94 0.99 0.94  CALCIUM 8.8* 8.6* 9.0  PROT 6.5  --   --   ALBUMIN 3.3*  --   --   AST 32  --   --   ALT 19  --   --   ALKPHOS 39  --   --   BILITOT 0.9  --   --   GFRNONAA >60 >60 >60  GFRAA >60 >60 >60  ANIONGAP 8 4* 9     Hematology Recent Labs Lab 03/13/17 1618 03/14/17 0427  WBC 7.3 6.7  RBC 5.32* 5.48*  HGB 13.9 13.9  HCT 45.5 47.8*  MCV 85.5 87.2  MCH 26.1 25.4*  MCHC 30.5 29.1*  RDW 17.0* 17.2*  PLT 223 219  Cardiac EnzymesNo results for input(s): TROPONINI in the last 168 hours.  Recent Labs Lab 03/13/17 1624  TROPIPOC 0.01     BNP Recent Labs Lab 03/13/17 1618  BNP 210.2*     DDimer No results for input(s): DDIMER in the last 168 hours.   Radiology    Dg Chest Port 1 View  Result Date: 03/13/2017 CLINICAL DATA:  Worsening shortness of breath. EXAM: PORTABLE CHEST 1 VIEW COMPARISON:  CT 05/07/2016.  Chest x-ray 05/05/2016 . FINDINGS: Cardiomegaly with bilateral pulmonary interstitial prominence consistent with congestive heart failure. No pleural effusion or pneumothorax. IMPRESSION: Congestive heart failure with bilateral pulmonary interstitial edema. Electronically Signed   By: Marcello Moores  Register   On: 03/13/2017 16:33    Cardiac Studies   03/14/2017 ECHOCARDIOGRAM Study Conclusions  - Left ventricle: The cavity size was normal. Wall thickness was   normal. Systolic function was normal. The estimated ejection   fraction was in the range of 60% to 65%. Although no diagnostic   regional wall motion abnormality was identified, this possibility   cannot be completely excluded on the basis of this study.   Features are consistent with a pseudonormal left  ventricular   filling pattern, with concomitant abnormal relaxation and   increased filling pressure (grade 2 diastolic dysfunction). - Ventricular septum: Mildly D-shaped interventricular septum   suggestive of RV pressure/volume overload. - Aortic valve: There was no stenosis. - Mitral valve: There was no significant regurgitation. - Right ventricle: The cavity size was mildly dilated. Systolic   function was normal. - Right atrium: The atrium was mildly dilated. - Pulmonary arteries: No complete TR doppler jet so unable to   estimate PA systolic pressure. - Systemic veins: IVC measured 2.0 cm with < 50% respirophasic   variation, suggesting RA pressure 8 mmHg.  Impressions:  - Normal LV size with EF 60-65%. Moderate diastolic dysfunction.   Mildly dilated RV with normal systolic function. Mildly D-shaped   interventricular septum suggesting RV pressure/volume overload.  Patient Profile     Theresa Kirby is a 61 y.o. female with a hx of morbid obesity, OSA on CPAP (noncompliant), arthritis, hypothyroidism, asthma, iron deficiency anemia, non-traumatic rhabdo of RLE 2017, A1C 6.9 (without prior dx of DM) who is being seen for the evaluation of CHF at the request of Dr. Lorin Mercy.  Assessment & Plan    1. Acute diastolic congestive heart failure with acute hypoxic respiratory failure:   Her 2D echo yesterday showed EF 60-65%,  G2DD, changes concerning for RV pressure/volume overload. Mildly dilated right atrium. -- Creatinine (0.94)  is tolerating diuresis well, today's weight is 344 (weight 297 on 05/2016) -- she is experiencing some cramping, potassium is WNL. I have added on a magnesium level.  -- Continue Lasix 40 mg Iv BID, she is still dropping her O2 sats on room air and likely still needs a few days of diuresis.   Morbidly obese: Has been encouraged for weight loss.  Hypertension: BP controlled, watch with diuresis.  OSA  - CPAP  Newly recognized DM- per  IM. Signed, Linus Mako, PA-C  03/15/2017, 9:13 AM    Personally seen and examined. Agree with above.  Cramping noted (agree with mag check) Obese, alert, RRR, 2+ edema  Acute diastolic HF  - continue IV lasix  - -5.8 liters out  - keep going  Morbid obesity  - encourage wt loss.  358>>344  Candee Furbish, MD

## 2017-03-15 NOTE — Progress Notes (Addendum)
PROGRESS NOTE    Theresa Kirby  QJJ:[id number] DOB: 15-Sep-1955 DOA: 03/13/2017 PCP: Patient, No Pcp Per    Brief Narrative:  Theresa Kirby a 61 y.o.femalewith medical history significant of ?? hypothyroidism; OSA on CPAP sometimes; asthma; and arthritis presenting with dyspnea on exertion. She is admitted for acute CHF.    Assessment & Plan:   Principal Problem:   Acute respiratory failure with hypoxia (HCC) Active Problems:   Morbid obesity (HCC)   CHF exacerbation (HCC)   Hypothyroidism   OSA (obstructive sleep apnea)   Elevated blood pressure reading without diagnosis of hypertension   Acute congestive heart failure ([place])  #1 acute respiratory failure with hypoxia secondary to acute CHF exacerbation Patient with some clinical improvement however still volume overloaded on examination.patient still hypoxic on room air. Continue diuresis with IV Lasix. ACE inhibitor on hold to allow aggressive diuresis per cardiology withering blood pressure. 2-D echo pending. Cardiology following and appreciate input and recommendations.  #2 morbid obesity  #3 elevated blood pressure Improved on Lasix.  #4 elevated TSH TSH of 5.971. Check a free T4 and a T3. Will likely need outpatient follow-up with repeat labs. Per med rec patient not on Synthroid.  #5 obstructive sleep apnea CPAP QHS  #6 ?? New onset diabetes mellitus Hemoglobin A1c 6.9.CBGs 142. Place on sliding scale insulin.   DVT prophylaxis: Lovenox Code Status: full Family Communication: updated patient. No family at bedside. Disposition Plan: home once patient is adequately diuresed and per cardiology.   Consultants:   Cardiology: Dr. Marlou Porch 03/14/2017  Procedures:   CXR 03/13/2017  2 d echo 03/14/2017  Antimicrobials:   none   Subjective: Patient states shortness of breath improving. No chest pain. Patient complained of some cramping in the hands. Patient also with some complaints of some dizziness.  Patient noted to be hypoxic on room air on ambulation.  Objective: Vitals:   03/15/17 0256 03/15/17 0611 03/15/17 0900 03/15/17 1145  BP:  135/89 137/66 105/67  Pulse:  73 68 60  Resp:  20 20   Temp:  98.6 F (37 C) 98.4 F (36.9 C) 98.3 F (36.8 C)  TempSrc:  Oral Oral Oral  SpO2:  98% 93% 95%  Weight: (!) 156.1 kg (344 lb 1.6 oz)     Height:        Intake/Output Summary (Last 24 hours) at 03/15/17 2010 Last data filed at 03/15/17 1914  Gross per 24 hour  Intake             1960 ml  Output             4025 ml  Net            -2065 ml   Filed Weights   03/13/17 1555 03/14/17 0002 03/15/17 0256  Weight: (!) 162.4 kg (358 lb) (!) 157.7 kg (347 lb 11.2 oz) (!) 156.1 kg (344 lb 1.6 oz)    Examination:  General exam: Appears calm and comfortable  Respiratory system: Decreased BS in bases. Bibasilar crackles. Cardiovascular system: S1 & S2 heard, RRR. No JVD, murmurs, rubs, gallops or clicks. 2+ BLE edema. Gastrointestinal system: Abdomen is nondistended, soft and nontender. No organomegaly or masses felt. Normal bowel sounds heard. Central nervous system: Alert and oriented. No focal neurological deficits. Extremities: Symmetric 5 x 5 power. Skin: No rashes, lesions or ulcers Psychiatry: Judgement and insight appear normal. Mood & affect appropriate.     Data Reviewed: I have personally reviewed following labs and imaging  studies  CBC:  Recent Labs Lab 03/13/17 1618 03/14/17 0427  WBC 7.3 6.7  NEUTROABS 5.1 4.6  HGB 13.9 13.9  HCT 45.5 47.8*  MCV 85.5 87.2  PLT 223 481   Basic Metabolic Panel:  Recent Labs Lab 03/13/17 1618 03/14/17 0427 03/15/17 0324 03/15/17 0942  NA 141 143 141  --   K 4.6 4.1 4.4  --   CL 101 99* 92*  --   CO2 32 40* 40*  --   GLUCOSE 93 117* 119*  --   BUN 23* 19 20  --   CREATININE 0.94 0.99 0.94  --   CALCIUM 8.8* 8.6* 9.0  --   MG  --   --   --  2.3   GFR: Estimated Creatinine Clearance: 95.8 mL/min (by C-G formula based  on SCr of 0.94 mg/dL). Liver Function Tests:  Recent Labs Lab 03/13/17 1618  AST 32  ALT 19  ALKPHOS 39  BILITOT 0.9  PROT 6.5  ALBUMIN 3.3*    Recent Labs Lab 03/13/17 1618  LIPASE 20   No results for input(s): AMMONIA in the last 168 hours. Coagulation Profile: No results for input(s): INR, PROTIME in the last 168 hours. Cardiac Enzymes: No results for input(s): CKTOTAL, CKMB, CKMBINDEX, TROPONINI in the last 168 hours. BNP (last 3 results) No results for input(s): PROBNP in the last 8760 hours. HbA1C:  Recent Labs  03/13/17 2358  HGBA1C 6.9*   CBG:  Recent Labs Lab 03/13/17 1641 03/15/17 1557  GLUCAP 85 142*   Lipid Profile:  Recent Labs  03/14/17 0427  CHOL 151  HDL 40*  LDLCALC 90  TRIG 107  CHOLHDL 3.8   Thyroid Function Tests:  Recent Labs  03/13/17 2358  TSH 5.971*   Anemia Panel: No results for input(s): VITAMINB12, FOLATE, FERRITIN, TIBC, IRON, RETICCTPCT in the last 72 hours. Sepsis Labs: No results for input(s): PROCALCITON, LATICACIDVEN in the last 168 hours.  No results found for this or any previous visit (from the past 240 hour(s)).       Radiology Studies: No results found.      Scheduled Meds: . aspirin EC  81 mg Oral Daily  . chlorhexidine  15 mL Mouth Rinse BID  . enoxaparin (LOVENOX) injection  0.5 mg/kg Subcutaneous Q24H  . furosemide  40 mg Intravenous BID  . mouth rinse  15 mL Mouth Rinse q12n4p  . sodium chloride flush  3 mL Intravenous Q12H   Continuous Infusions: . sodium chloride       LOS: 2 days    Time spent: 13 mins    THOMPSON,DANIEL, MD [facility] Pager [phone]  If 7PM-7AM, please contact night-coverage [email or web address] Password [email or web address] 03/15/2017, 8:10 PM

## 2017-03-16 DIAGNOSIS — I5023 Acute on chronic systolic (congestive) heart failure: Secondary | ICD-10-CM

## 2017-03-16 DIAGNOSIS — E0781 Sick-euthyroid syndrome: Secondary | ICD-10-CM

## 2017-03-16 LAB — CBC
HCT: 50 % — ABNORMAL HIGH (ref 36.0–46.0)
Hemoglobin: 14.3 g/dL (ref 12.0–15.0)
MCH: 25.6 pg — AB (ref 26.0–34.0)
MCHC: 28.6 g/dL — AB (ref 30.0–36.0)
MCV: 89.4 fL (ref 78.0–100.0)
Platelets: 200 10*3/uL (ref 150–400)
RBC: 5.59 MIL/uL — ABNORMAL HIGH (ref 3.87–5.11)
RDW: 16.9 % — AB (ref 11.5–15.5)
WBC: 5.4 10*3/uL (ref 4.0–10.5)

## 2017-03-16 LAB — BASIC METABOLIC PANEL
Anion gap: 9 (ref 5–15)
BUN: 19 mg/dL (ref 6–20)
CALCIUM: 8.9 mg/dL (ref 8.9–10.3)
CO2: 40 mmol/L — AB (ref 22–32)
CREATININE: 0.86 mg/dL (ref 0.44–1.00)
Chloride: 90 mmol/L — ABNORMAL LOW (ref 101–111)
GFR calc non Af Amer: >60 mL/min (ref >60)
Glucose, Bld: 117 mg/dL — ABNORMAL HIGH (ref 65–99)
Potassium: 3.8 mmol/L (ref 3.5–5.1)
Sodium: 139 mmol/L (ref 135–145)

## 2017-03-16 LAB — GLUCOSE, CAPILLARY
GLUCOSE-CAPILLARY: 134 mg/dL — AB (ref 65–99)
GLUCOSE-CAPILLARY: 116 mg/dL — AB (ref 65–99)
Glucose-Capillary: 106 mg/dL — ABNORMAL HIGH (ref 65–99)
Glucose-Capillary: 116 mg/dL — ABNORMAL HIGH (ref 65–99)

## 2017-03-16 LAB — T4, FREE: FREE T4: 0.9 ng/dL (ref 0.61–1.12)

## 2017-03-16 MED ORDER — SALINE SPRAY 0.65 % NA SOLN
1.0000 | NASAL | Status: DC | PRN
  Filled 2017-03-16: qty 44

## 2017-03-16 MED ORDER — FUROSEMIDE 10 MG/ML IJ SOLN
80.0000 mg | Freq: Two times a day (BID) | INTRAMUSCULAR | Status: DC
  Administered 2017-03-16 – 2017-03-19 (×6): 80 mg via INTRAVENOUS
  Filled 2017-03-16 (×6): qty 8

## 2017-03-16 NOTE — Progress Notes (Signed)
Education provided on administration of insulin to pt at bedside, pt demonstrates understanding and verbalized via teach back

## 2017-03-16 NOTE — Progress Notes (Signed)
SATURATION QUALIFICATIONS: (This note is used to comply with regulatory documentation for home oxygen)  Patient Saturations on Room Air at Rest = 89%  Patient Saturations on Room Air while Ambulating = 84%  Patient Saturations on 3 Liters of oxygen while Ambulating = 93%  Please briefly explain why patient needs home oxygen:Patient is unable to maintain O2 sats 90% or > on RA.   Earney Navy, PTA Pager: [phone]

## 2017-03-16 NOTE — Progress Notes (Signed)
Progress Note  Patient Name: Theresa Kirby Date of Encounter: 03/16/2017  Primary Cardiologist:  New to Dr. Marlou Porch  Subjective   Had cramping in hands previously, no SOB.    Inpatient Medications    Scheduled Meds: . aspirin EC  81 mg Oral Daily  . chlorhexidine  15 mL Mouth Rinse BID  . enoxaparin (LOVENOX) injection  0.5 mg/kg Subcutaneous Q24H  . furosemide  40 mg Intravenous BID  . insulin aspart  0-15 Units Subcutaneous TID WC  . mouth rinse  15 mL Mouth Rinse q12n4p  . sodium chloride flush  3 mL Intravenous Q12H   Continuous Infusions: . sodium chloride     PRN Meds: sodium chloride, acetaminophen, albuterol, magnesium hydroxide, montelukast, ondansetron (ZOFRAN) IV, sodium chloride flush   Vital Signs    Vitals:   03/15/17 0900 03/15/17 1145 03/15/17 2124 03/16/17 0656  BP: 137/66 105/67 126/69 131/78  Pulse: 68 60 63 67  Resp: 20  18   Temp: 98.4 F (36.9 C) 98.3 F (36.8 C) 98.3 F (36.8 C) 98.9 F (37.2 C)  TempSrc: Oral Oral Oral Oral  SpO2: 93% 95% 95% 94%  Weight:    (!) 338 lb 14.4 oz (153.7 kg)  Height:        Intake/Output Summary (Last 24 hours) at 03/16/17 1112 Last data filed at 03/16/17 0758  Gross per 24 hour  Intake             2080 ml  Output             1700 ml  Net              380 ml   Filed Weights   03/14/17 0002 03/15/17 0256 03/16/17 0656  Weight: (!) 347 lb 11.2 oz (157.7 kg) (!) 344 lb 1.6 oz (156.1 kg) (!) 338 lb 14.4 oz (153.7 kg)    Telemetry    Sinus rhythm - Personally Reviewed   Physical Exam  GEN: Well nourished, well developed, in no acute distress obese HEENT: normal  Neck: no JVD, carotid bruits, or masses Cardiac: RRR; no murmurs, rubs, or gallops,no edema  Respiratory:  clear to auscultation bilaterally, normal work of breathing GI: soft, nontender, nondistended, + BS MS: no deformity or atrophy  Skin: warm and dry, no rash Neuro:  Alert and Oriented x 3, Strength and sensation are  intact Psych: euthymic mood, full affect   Labs    Chemistry  Recent Labs Lab 03/13/17 1618 03/14/17 0427 03/15/17 0324 03/16/17 0411  NA 141 143 141 139  K 4.6 4.1 4.4 3.8  CL 101 99* 92* 90*  CO2 32 40* 40* 40*  GLUCOSE 93 117* 119* 117*  BUN 23* 19 20 19   CREATININE 0.94 0.99 0.94 0.86  CALCIUM 8.8* 8.6* 9.0 8.9  PROT 6.5  --   --   --   ALBUMIN 3.3*  --   --   --   AST 32  --   --   --   ALT 19  --   --   --   ALKPHOS 39  --   --   --   BILITOT 0.9  --   --   --   GFRNONAA >60 >60 >60 >60  GFRAA >60 >60 >60 >60  ANIONGAP 8 4* 9 9     Hematology  Recent Labs Lab 03/13/17 1618 03/14/17 0427 03/16/17 0411  WBC 7.3 6.7 5.4  RBC 5.32* 5.48* 5.59*  HGB 13.9  13.9 14.3  HCT 45.5 47.8* 50.0*  MCV 85.5 87.2 89.4  MCH 26.1 25.4* 25.6*  MCHC 30.5 29.1* 28.6*  RDW 17.0* 17.2* 16.9*  PLT 223 219 200    Cardiac EnzymesNo results for input(s): TROPONINI in the last 168 hours.   Recent Labs Lab 03/13/17 1624  TROPIPOC 0.01     BNP  Recent Labs Lab 03/13/17 1618  BNP 210.2*     DDimer No results for input(s): DDIMER in the last 168 hours.   Radiology    No results found.  Cardiac Studies   03/14/2017 ECHOCARDIOGRAM Study Conclusions  - Left ventricle: The cavity size was normal. Wall thickness was   normal. Systolic function was normal. The estimated ejection   fraction was in the range of 60% to 65%. Although no diagnostic   regional wall motion abnormality was identified, this possibility   cannot be completely excluded on the basis of this study.   Features are consistent with a pseudonormal left ventricular   filling pattern, with concomitant abnormal relaxation and   increased filling pressure (grade 2 diastolic dysfunction). - Ventricular septum: Mildly D-shaped interventricular septum   suggestive of RV pressure/volume overload. - Aortic valve: There was no stenosis. - Mitral valve: There was no significant regurgitation. - Right  ventricle: The cavity size was mildly dilated. Systolic   function was normal. - Right atrium: The atrium was mildly dilated. - Pulmonary arteries: No complete TR doppler jet so unable to   estimate PA systolic pressure. - Systemic veins: IVC measured 2.0 cm with < 50% respirophasic   variation, suggesting RA pressure 8 mmHg.  Impressions:  - Normal LV size with EF 60-65%. Moderate diastolic dysfunction.   Mildly dilated RV with normal systolic function. Mildly D-shaped   interventricular septum suggesting RV pressure/volume overload.  Patient Profile     Theresa Kirby is a 61 y.o. female with a hx of morbid obesity, OSA on CPAP (noncompliant), arthritis, hypothyroidism, asthma, iron deficiency anemia, non-traumatic rhabdo of RLE 2017, A1C 6.9 (without prior dx of DM) who is being seen for the evaluation of CHF at the request of Dr. Lorin Mercy.  Assessment & Plan     Acute diastolic HF  - continue IV lasix, increased to 80 BID  - -5.5 liters out  - EF normal  - signs of RV volume overload (obesity)   Morbid obesity  - encourage wt loss.   - 338lbs now  Candee Furbish, MD

## 2017-03-16 NOTE — Progress Notes (Signed)
Pt requesting nasal spray, informed MD

## 2017-03-16 NOTE — Progress Notes (Signed)
Physical Therapy Treatment Patient Details Name: Theresa Kirby MRN: [id number] DOB: 06-21-1956 Today's Date: 03/16/2017    History of Present Illness Pt is a 61 yo female admitted from physicians office with c/o SoB and weight gain, SaO2 on RA 82% O2. Dx with CHF. Prior medical history significant of hypothyroidism; OSA on CPAP sometimes; asthma; and arthritis    PT Comments    Patient tolerated increased gait distance this session with several standing rest breaks due to SOB and c/o lightheadedness. SpO2 down to 89% with 3L O2 via [place] when ambulating. Continue to progress as tolerated.    Follow Up Recommendations  Home health PT;Supervision - Intermittent     Equipment Recommendations  None recommended by PT    Recommendations for Other Services       Precautions / Restrictions Precautions Precautions: None Restrictions Weight Bearing Restrictions: No    Mobility  Bed Mobility               General bed mobility comments: pt OOB in chair upon arrival  Transfers Overall transfer level: Needs assistance Equipment used: None Transfers: Sit to/from Stand Sit to Stand: Supervision         General transfer comment: supervision for safety  Ambulation/Gait Ambulation/Gait assistance: Min guard;Min assist Ambulation Distance (Feet): 150 Feet Assistive device: Straight cane Gait Pattern/deviations: Step-through pattern;Decreased step length - right;Decreased step length - left;Wide base of support Gait velocity: slowed   General Gait Details: pt with R lateral LOB with horizontal head and turning simultaneously but able to recover with min A; several standing rest breaks due to SOB and feeling of lightheadedness; SpO2 down to 89% with 3L O2 via [place] while ambulating; cues for breathing technique and monitoring for rest breaks   Stairs            Wheelchair Mobility    Modified Rankin (Stroke Patients Only)       Balance Overall balance assessment:  Needs assistance Sitting-balance support: No upper extremity supported Sitting balance-Leahy Scale: Normal     Standing balance support: Single extremity supported;During functional activity Standing balance-Leahy Scale: Fair                              Cognition Arousal/Alertness: Awake/alert Behavior During Therapy: WFL for tasks assessed/performed Overall Cognitive Status: Within Functional Limits for tasks assessed                                        Exercises      General Comments        Pertinent Vitals/Pain Pain Assessment: No/denies pain    Home Living                      Prior Function            PT Goals (current goals can now be found in the care plan section) Acute Rehab PT Goals Patient Stated Goal: go home PT Goal Formulation: With patient Time For Goal Achievement: 03/21/17 Potential to Achieve Goals: Good Progress towards PT goals: Progressing toward goals    Frequency    Min 3X/week      PT Plan Current plan remains appropriate    Co-evaluation              AM-PAC PT "6 Clicks" Daily Activity  Outcome Measure  Difficulty turning over in bed (including adjusting bedclothes, sheets and blankets)?: None Difficulty moving from lying on back to sitting on the side of the bed? : None Difficulty sitting down on and standing up from a chair with arms (e.g., wheelchair, bedside commode, etc,.)?: None Help needed moving to and from a bed to chair (including a wheelchair)?: None Help needed walking in hospital room?: A Little Help needed climbing 3-5 steps with a railing? : A Lot 6 Click Score: 21    End of Session Equipment Utilized During Treatment: Gait belt;Oxygen Activity Tolerance: Patient tolerated treatment well Patient left: in chair;with call bell/phone within reach;with chair alarm set Nurse Communication: Mobility status PT Visit Diagnosis: Unsteadiness on feet (R26.81);Other  abnormalities of gait and mobility (R26.89);Muscle weakness (generalized) (M62.81);Dizziness and giddiness (R42)     Time: 7867-5449 PT Time Calculation (min) (ACUTE ONLY): 23 min  Charges:  $Gait Training: 8-22 mins $Therapeutic Activity: 8-22 mins                    G Codes:       Earney Navy, PTA Pager: [phone]     Darliss Cheney 03/16/2017, 3:52 PM

## 2017-03-16 NOTE — Progress Notes (Signed)
PROGRESS NOTE    Theresa Kirby  UEA:[id number] DOB: 1956/03/09 DOA: 03/13/2017 PCP: Patient, No Pcp Per    Brief Narrative:  Theresa Kirby a 61 y.o.femalewith medical history significant of ?? hypothyroidism; OSA on CPAP sometimes; asthma; and arthritis presenting with dyspnea on exertion. She is admitted for acute CHF.    Assessment & Plan:   Principal Problem:   Acute respiratory failure with hypoxia (HCC) Active Problems:   Morbid obesity (HCC)   CHF exacerbation (HCC)   Hypothyroidism   OSA (obstructive sleep apnea)   Elevated blood pressure reading without diagnosis of hypertension   Acute congestive heart failure ([place])   Euthyroid sick syndrome  #1 acute respiratory failure with hypoxia secondary to acute diastolicCHF exacerbation Patient with some clinical improvement however still volume overloaded on examination.patient still hypoxic on room air and with ambulation. IV Lasix dose has been increased per cardiology. ACE inhibitor on hold to allow aggressive diuresis per cardiology. 2-D echo with EF of 47-82%, grade 2 diastolic dysfunction, mildly D shaped interventricular septum suggestive of right ventricular pressure/volume overload. Weight loss and compliance with CPAP stress to patient. Cardiology following and appreciate input and recommendations.  #2 morbid obesity  #3 elevated blood pressure Improved on Lasix.  #4 elevated TSH/Euthyroid sick syndrome TSH of 5.971. Free T4 at 0.90. Likely euthyroid sick syndrome. Will likely need outpatient follow-up with repeat labs in about 4-6 weeks.  #5 obstructive sleep apnea CPAP QHS  #6 ?? New onset diabetes mellitus Hemoglobin A1c 6.9.CBGs 106-134. Continue sliding scale insulin.   DVT prophylaxis: Lovenox Code Status: full Family Communication: updated patient. No family at bedside. Disposition Plan: home once patient is adequately diuresed and per cardiology.   Consultants:   Cardiology: Dr. Marlou Porch  03/14/2017  Procedures:   CXR 03/13/2017  2 d echo 03/14/2017  Antimicrobials:   none   Subjective: Patient with improving shortness of breath. No chest pain. Improved cramping. Patient still hypoxic on room air and on ambulation.  Objective: Vitals:   03/15/17 1145 03/15/17 2124 03/16/17 0656 03/16/17 1133  BP: 105/67 126/69 131/78 (!) 137/51  Pulse: 60 63 67 (!) 56  Resp:  18  18  Temp: 98.3 F (36.8 C) 98.3 F (36.8 C) 98.9 F (37.2 C) 98 F (36.7 C)  TempSrc: Oral Oral Oral Oral  SpO2: 95% 95% 94% 95%  Weight:   (!) 153.7 kg (338 lb 14.4 oz)   Height:        Intake/Output Summary (Last 24 hours) at 03/16/17 1530 Last data filed at 03/16/17 1408  Gross per 24 hour  Intake             1080 ml  Output             1900 ml  Net             -820 ml   Filed Weights   03/14/17 0002 03/15/17 0256 03/16/17 0656  Weight: (!) 157.7 kg (347 lb 11.2 oz) (!) 156.1 kg (344 lb 1.6 oz) (!) 153.7 kg (338 lb 14.4 oz)    Examination:  General exam: Appears calm and comfortable  Respiratory system: Decreased BS in bases. Bibasilar crackles. Cardiovascular system: S1 & S2 heard, RRR. No JVD, murmurs, rubs, gallops or clicks. 2+ BLE edema. Gastrointestinal system: Abdomen is nondistended, soft and nontender. No organomegaly or masses felt. Normal bowel sounds heard. Central nervous system: Alert and oriented. No focal neurological deficits. Extremities: Symmetric 5 x 5 power. Skin: No rashes,  lesions or ulcers Psychiatry: Judgement and insight appear normal. Mood & affect appropriate.     Data Reviewed: I have personally reviewed following labs and imaging studies  CBC:  Recent Labs Lab 03/13/17 1618 03/14/17 0427 03/16/17 0411  WBC 7.3 6.7 5.4  NEUTROABS 5.1 4.6  --   HGB 13.9 13.9 14.3  HCT 45.5 47.8* 50.0*  MCV 85.5 87.2 89.4  PLT 223 219 628   Basic Metabolic Panel:  Recent Labs Lab 03/13/17 1618 03/14/17 0427 03/15/17 0324 03/15/17 0942 03/16/17 0411    NA 141 143 141  --  139  K 4.6 4.1 4.4  --  3.8  CL 101 99* 92*  --  90*  CO2 32 40* 40*  --  40*  GLUCOSE 93 117* 119*  --  117*  BUN 23* 19 20  --  19  CREATININE 0.94 0.99 0.94  --  0.86  CALCIUM 8.8* 8.6* 9.0  --  8.9  MG  --   --   --  2.3  --    GFR: Estimated Creatinine Clearance: 103.6 mL/min (by C-G formula based on SCr of 0.86 mg/dL). Liver Function Tests:  Recent Labs Lab 03/13/17 1618  AST 32  ALT 19  ALKPHOS 39  BILITOT 0.9  PROT 6.5  ALBUMIN 3.3*    Recent Labs Lab 03/13/17 1618  LIPASE 20   No results for input(s): AMMONIA in the last 168 hours. Coagulation Profile: No results for input(s): INR, PROTIME in the last 168 hours. Cardiac Enzymes: No results for input(s): CKTOTAL, CKMB, CKMBINDEX, TROPONINI in the last 168 hours. BNP (last 3 results) No results for input(s): PROBNP in the last 8760 hours. HbA1C:  Recent Labs  03/13/17 2358  HGBA1C 6.9*   CBG:  Recent Labs Lab 03/13/17 1641 03/15/17 1557 03/16/17 0745 03/16/17 1111  GLUCAP 85 142* 106* 134*   Lipid Profile:  Recent Labs  03/14/17 0427  CHOL 151  HDL 40*  LDLCALC 90  TRIG 107  CHOLHDL 3.8   Thyroid Function Tests:  Recent Labs  03/13/17 2358 03/16/17 0411  TSH 5.971*  --   FREET4  --  0.90   Anemia Panel: No results for input(s): VITAMINB12, FOLATE, FERRITIN, TIBC, IRON, RETICCTPCT in the last 72 hours. Sepsis Labs: No results for input(s): PROCALCITON, LATICACIDVEN in the last 168 hours.  No results found for this or any previous visit (from the past 240 hour(s)).       Radiology Studies: No results found.      Scheduled Meds: . aspirin EC  81 mg Oral Daily  . chlorhexidine  15 mL Mouth Rinse BID  . enoxaparin (LOVENOX) injection  0.5 mg/kg Subcutaneous Q24H  . furosemide  80 mg Intravenous BID  . insulin aspart  0-15 Units Subcutaneous TID WC  . mouth rinse  15 mL Mouth Rinse q12n4p  . sodium chloride flush  3 mL Intravenous Q12H    Continuous Infusions: . sodium chloride       LOS: 3 days    Time spent: 73 mins    THOMPSON,DANIEL, MD [facility] Pager [phone]  If 7PM-7AM, please contact night-coverage [email or web address] Password [email or web address] 03/16/2017, 3:30 PM

## 2017-03-16 NOTE — Progress Notes (Signed)
Heart Failure Navigator Consult Note  Presentation: per Dr Theresa Kirby- Theresa Kirby is a 61 y.o.femalewith a hx of morbid obesity, OSA on CPAP (noncompliant), arthritis, hypothyroidism, asthma, iron deficiency anemia, non-traumatic rhabdo of RLE 2017, A1C 6.9 (without prior dx of DM) who is being seen for the evaluation of CHFat the request of Dr. Lorin Kirby.  Past Medical History:  Diagnosis Date  . Arthritis    knees  . Asthma   . Iron deficiency anemia   . Morbid obesity ([place])   . Rhabdomyolysis 04/2016   normal compartment pressures  . Sleep apnea    uses cpap, setting is unknown, does not wear it consistently  . Thyroid disease     Social History   Social History  . Marital status: Single    Spouse name: N/A  . Number of children: N/A  . Years of education: N/A   Occupational History  . 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 Smoking status: Never Smoker  . Smokeless tobacco: Never Used  . Alcohol use No  . Drug use: No  . Sexual activity: Not Currently    Birth control/ protection: None   Other Topics Concern  . None   Social History Narrative  . None    ECHO:Study Conclusions-03/14/17  - Left ventricle: The cavity size was normal. Wall thickness was   normal. Systolic function was normal. The estimated ejection   fraction was in the range of 60% to 65%. Although no diagnostic   regional wall motion abnormality was identified, this possibility   cannot be completely excluded on the basis of this study.   Features are consistent with a pseudonormal left ventricular   filling pattern, with concomitant abnormal relaxation and   increased filling pressure (grade 2 diastolic dysfunction). - Ventricular septum: Mildly D-shaped interventricular septum   suggestive of RV pressure/volume overload. - Aortic valve: There was no stenosis. - Mitral valve: There was no significant regurgitation. - Right ventricle: The cavity size was  mildly dilated. Systolic   function was normal. - Right atrium: The atrium was mildly dilated. - Pulmonary arteries: No complete TR doppler jet so unable to   estimate PA systolic pressure. - Systemic veins: IVC measured 2.0 cm with < 50% respirophasic   variation, suggesting RA pressure 8 mmHg.  Impressions:  - Normal LV size with EF 60-65%. Moderate diastolic dysfunction.   Mildly dilated RV with normal systolic function. Mildly D-shaped   interventricular septum suggesting RV pressure/volume overload.  ------------------------------------------------------------------- Study data:  No prior study was available for comparison.  Study status:  Routine.  Procedure:  Transthoracic echocardiography. Image quality was adequate. The study was technically difficult, as a result of poor sound wave transmission and body habitus. Intravenous contrast (Definity) was administered.  Study completion:  There were no complications.  Transthoracic echocardiography.  M-mode, complete 2D, spectral Doppler, and color Doppler.  Birthdate:  Patient birthdate: [birth year].  Age:  Patient is 61 yr old.  Sex:  Gender: female.    BMI: 59.7 kg/m^2.  Blood pressure:     117/57  Patient status:  Inpatient.  Study date: Study date: 03/14/2017. Study time: 04:21 PM.  Location:  Bedside.  BNP    Component Value Date/Time   BNP 210.2 (H) 03/13/2017 1618    ProBNP    Component Value Date/Time   PROBNP 122.8 12/13/2011 1256     Education Assessment and Provision:  Detailed education and instructions provided on heart  failure disease management including the following:  Signs and symptoms of Heart Failure When to call the physician Importance of daily weights Low sodium diet Fluid restriction Medication management Anticipated future follow-up appointments  Patient education given on each of the above topics.  Patient acknowledges understanding and acceptance of all instructions.   I spoke with  [name] regarding her new HF diagnosis and current hospitalization.  She tells me that her mother had HF before she died 12 years ago.  We discussed the diagnosis and HF recommendations.  She tells me that she has a scale at home.  We discussed the importance of daily weights and how increased weights relate to HF/ when to call the physician.  I also reviewed a low sodium diet and high sodium foods to avoid.  She denies any issues getting or taking prescribed medications (although did not take anything prior to admission).  She has a daughter that is local and she can call as needed.  She will follow with [facility].  Education Materials:  "Living Better With Heart Failure" Booklet, Daily Weight Tracker Tool   High Risk Criteria for Readmission and/or Poor Patient Outcomes:   EF <30%- 60-65% with grade 2 dias dys  2 or more admissions in 6 months- 1/6 mo  Difficult social situation- no  Demonstrates medication noncompliance- no real meds PTA--denies   Barriers of Care:  Insight, knowledge and compliance  Discharge Planning:   Plans to return to home alone in [place].  She may benefit from [facility] for ongoing education, symptom recognition and compliance reinforcement.

## 2017-03-17 LAB — BASIC METABOLIC PANEL
Anion gap: 9 (ref 5–15)
BUN: 21 mg/dL — AB (ref 6–20)
CHLORIDE: 90 mmol/L — AB (ref 101–111)
CO2: 41 mmol/L — AB (ref 22–32)
Calcium: 9 mg/dL (ref 8.9–10.3)
Creatinine, Ser: 0.98 mg/dL (ref 0.44–1.00)
GFR calc Af Amer: >60 mL/min (ref >60)
GLUCOSE: 118 mg/dL — AB (ref 65–99)
POTASSIUM: 3.8 mmol/L (ref 3.5–5.1)
Sodium: 140 mmol/L (ref 135–145)

## 2017-03-17 LAB — GLUCOSE, CAPILLARY
GLUCOSE-CAPILLARY: 118 mg/dL — AB (ref 65–99)
Glucose-Capillary: 133 mg/dL — ABNORMAL HIGH (ref 65–99)
Glucose-Capillary: 122 mg/dL — ABNORMAL HIGH (ref 65–99)
Glucose-Capillary: 125 mg/dL — ABNORMAL HIGH (ref 65–99)

## 2017-03-17 LAB — T3, FREE: T3, Free: 2.8 pg/mL (ref 2.0–4.4)

## 2017-03-17 LAB — T3: T3, Total: 102 ng/dL (ref 71–180)

## 2017-03-17 NOTE — Progress Notes (Signed)
Progress Note  Patient Name: Theresa Kirby Date of Encounter: 03/17/2017  Primary Cardiologist:  New to Dr. Marlou Porch  Subjective   Walking hall. SOB. Needs to stop periodically. No CP  Inpatient Medications    Scheduled Meds: . aspirin EC  81 mg Oral Daily  . chlorhexidine  15 mL Mouth Rinse BID  . enoxaparin (LOVENOX) injection  0.5 mg/kg Subcutaneous Q24H  . furosemide  80 mg Intravenous BID  . insulin aspart  0-15 Units Subcutaneous TID WC  . mouth rinse  15 mL Mouth Rinse q12n4p  . sodium chloride flush  3 mL Intravenous Q12H   Continuous Infusions: . sodium chloride     PRN Meds: sodium chloride, acetaminophen, albuterol, magnesium hydroxide, montelukast, ondansetron (ZOFRAN) IV, sodium chloride, sodium chloride flush   Vital Signs    Vitals:   03/16/17 0656 03/16/17 1133 03/16/17 2146 03/17/17 0656  BP: 131/78 (!) 137/51 130/83 133/75  Pulse: 67 (!) 56 66 69  Resp:  18 18 18   Temp: 98.9 F (37.2 C) 98 F (36.7 C) 98.1 F (36.7 C) 97.7 F (36.5 C)  TempSrc: Oral Oral Oral Oral  SpO2: 94% 95% 98% 94%  Weight: (!) 338 lb 14.4 oz (153.7 kg)   (!) 336 lb 11.2 oz (152.7 kg)  Height:        Intake/Output Summary (Last 24 hours) at 03/17/17 1050 Last data filed at 03/17/17 0925  Gross per 24 hour  Intake              960 ml  Output             2800 ml  Net            -1840 ml   Filed Weights   03/15/17 0256 03/16/17 0656 03/17/17 0656  Weight: (!) 344 lb 1.6 oz (156.1 kg) (!) 338 lb 14.4 oz (153.7 kg) (!) 336 lb 11.2 oz (152.7 kg)    Telemetry    Sinus rhythm - Personally Reviewed   Physical Exam   GEN: Well nourished, well developed, in no acute distress  HEENT: normal  Neck: no JVD, carotid bruits, or masses Cardiac: RRR; no murmurs, rubs, or gallops, +chronic edema  Respiratory:  clear to auscultation bilaterally, normal work of breathing GI: soft, nontender, nondistended, + BS, obese MS: no deformity or atrophy  Skin: warm and dry, no  rash Neuro:  Alert and Oriented x 3, Strength and sensation are intact Psych: euthymic mood, full affect    Labs    Chemistry  Recent Labs Lab 03/13/17 1618  03/15/17 0324 03/16/17 0411 03/17/17 0629  NA 141  < > 141 139 140  K 4.6  < > 4.4 3.8 3.8  CL 101  < > 92* 90* 90*  CO2 32  < > 40* 40* 41*  GLUCOSE 93  < > 119* 117* 118*  BUN 23*  < > 20 19 21*  CREATININE 0.94  < > 0.94 0.86 0.98  CALCIUM 8.8*  < > 9.0 8.9 9.0  PROT 6.5  --   --   --   --   ALBUMIN 3.3*  --   --   --   --   AST 32  --   --   --   --   ALT 19  --   --   --   --   ALKPHOS 39  --   --   --   --   BILITOT 0.9  --   --   --   --  GFRNONAA >60  < > >60 >60 >60  GFRAA >60  < > >60 >60 >60  ANIONGAP 8  < > 9 9 9   < > = values in this interval not displayed.   Hematology  Recent Labs Lab 03/13/17 1618 03/14/17 0427 03/16/17 0411  WBC 7.3 6.7 5.4  RBC 5.32* 5.48* 5.59*  HGB 13.9 13.9 14.3  HCT 45.5 47.8* 50.0*  MCV 85.5 87.2 89.4  MCH 26.1 25.4* 25.6*  MCHC 30.5 29.1* 28.6*  RDW 17.0* 17.2* 16.9*  PLT 223 219 200    Cardiac EnzymesNo results for input(s): TROPONINI in the last 168 hours.   Recent Labs Lab 03/13/17 1624  TROPIPOC 0.01     BNP  Recent Labs Lab 03/13/17 1618  BNP 210.2*     DDimer No results for input(s): DDIMER in the last 168 hours.   Radiology    No results found.  Cardiac Studies   03/14/2017 ECHOCARDIOGRAM Study Conclusions  - Left ventricle: The cavity size was normal. Wall thickness was   normal. Systolic function was normal. The estimated ejection   fraction was in the range of 60% to 65%. Although no diagnostic   regional wall motion abnormality was identified, this possibility   cannot be completely excluded on the basis of this study.   Features are consistent with a pseudonormal left ventricular   filling pattern, with concomitant abnormal relaxation and   increased filling pressure (grade 2 diastolic dysfunction). - Ventricular septum:  Mildly D-shaped interventricular septum   suggestive of RV pressure/volume overload. - Aortic valve: There was no stenosis. - Mitral valve: There was no significant regurgitation. - Right ventricle: The cavity size was mildly dilated. Systolic   function was normal. - Right atrium: The atrium was mildly dilated. - Pulmonary arteries: No complete TR doppler jet so unable to   estimate PA systolic pressure. - Systemic veins: IVC measured 2.0 cm with < 50% respirophasic   variation, suggesting RA pressure 8 mmHg.  Impressions:  - Normal LV size with EF 60-65%. Moderate diastolic dysfunction.   Mildly dilated RV with normal systolic function. Mildly D-shaped   interventricular septum suggesting RV pressure/volume overload.  Patient Profile     MARIESA GRIEDER is a 61 y.o. female with a hx of morbid obesity, OSA on CPAP (noncompliant), arthritis, hypothyroidism, asthma, iron deficiency anemia, non-traumatic rhabdo of RLE 2017, A1C 6.9 (without prior dx of DM) who is being seen for the evaluation of CHF at the request of Dr. Lorin Mercy.  Assessment & Plan     Acute diastolic HF  - continue IV lasix, increased to 80 BID  - 8 liters out  - EF normal  - signs of RV volume overload (obesity)  - ambulating hallway  - Bicarb up to 41 (in part from diuresis, also likely from obesity hypoventilation).   Morbid obesity  - encourage wt loss.   - 358 to 336 lbs now  Since she is still diuresing, I think it still makes sense to continue IV lasix. Creat. Stable.   Candee Furbish, MD

## 2017-03-17 NOTE — Progress Notes (Signed)
Physical Therapy Treatment Patient Details Name: Theresa Kirby MRN: [id number] DOB: 05-30-1956 Today's Date: 03/17/2017    History of Present Illness Pt is a 61 yo female admitted from physicians office with c/o SoB and weight gain, SaO2 on RA 82% O2. Dx with CHF. Prior medical history significant of hypothyroidism; OSA on CPAP sometimes; asthma; and arthritis    PT Comments    Patient continues to progress gradually with progression. Pt benefited from use of rollator this session for balance and energy conservation.  SpO2 88-95% on RA throughout session. Continues to required several rest breaks while ambulating. Current plan remains appropriate.   Follow Up Recommendations  Home health PT;Supervision - Intermittent     Equipment Recommendations  Other (comment) (bariatric rollator)    Recommendations for Other Services       Precautions / Restrictions Precautions Precautions: None Restrictions Weight Bearing Restrictions: No    Mobility  Bed Mobility               General bed mobility comments: pt OOB in chair upon arrival  Transfers Overall transfer level: Modified independent               General transfer comment: increased effort  Ambulation/Gait Ambulation/Gait assistance: Supervision Ambulation Distance (Feet): 250 Feet Assistive device:  (rollator) Gait Pattern/deviations: Step-through pattern;Wide base of support;Decreased stride length Gait velocity: decreased   General Gait Details: pt much more steady with use of rollator; several standing rest breaks and 2 seated rest breaks; pt on RA with SpO2 down to 88% when ambulating but up to 93% with brief rest periods   Stairs            Wheelchair Mobility    Modified Rankin (Stroke Patients Only)       Balance Overall balance assessment: Needs assistance Sitting-balance support: No upper extremity supported Sitting balance-Leahy Scale: Normal     Standing balance support: Single  extremity supported;During functional activity Standing balance-Leahy Scale: Fair                              Cognition Arousal/Alertness: Awake/alert Behavior During Therapy: WFL for tasks assessed/performed Overall Cognitive Status: Within Functional Limits for tasks assessed                                        Exercises      General Comments General comments (skin integrity, edema, etc.): SpO2 between 88-95% on RA throughout session      Pertinent Vitals/Pain Pain Assessment: No/denies pain    Home Living                      Prior Function            PT Goals (current goals can now be found in the care plan section) Acute Rehab PT Goals Patient Stated Goal: go home PT Goal Formulation: With patient Time For Goal Achievement: 03/21/17 Potential to Achieve Goals: Good Progress towards PT goals: Progressing toward goals    Frequency    Min 3X/week      PT Plan Current plan remains appropriate    Co-evaluation              AM-PAC PT "6 Clicks" Daily Activity  Outcome Measure  Difficulty turning over in bed (including adjusting bedclothes, sheets and blankets)?: None  Difficulty moving from lying on back to sitting on the side of the bed? : None Difficulty sitting down on and standing up from a chair with arms (e.g., wheelchair, bedside commode, etc,.)?: None Help needed moving to and from a bed to chair (including a wheelchair)?: None Help needed walking in hospital room?: A Little Help needed climbing 3-5 steps with a railing? : A Lot 6 Click Score: 21    End of Session Equipment Utilized During Treatment: Gait belt Activity Tolerance: Patient tolerated treatment well Patient left: in chair;with call bell/phone within reach Nurse Communication: Mobility status PT Visit Diagnosis: Unsteadiness on feet (R26.81);Other abnormalities of gait and mobility (R26.89);Muscle weakness (generalized) (M62.81);Dizziness  and giddiness (R42)     Time: 4825-0037 PT Time Calculation (min) (ACUTE ONLY): 26 min  Charges:  $Gait Training: 23-37 mins                    G Codes:       Earney Navy, PTA Pager: [phone]     Darliss Cheney 03/17/2017, 11:38 AM

## 2017-03-17 NOTE — Progress Notes (Signed)
PROGRESS NOTE    Theresa Kirby  BOF:[id number] DOB: 08-Aug-1955 DOA: 03/13/2017 PCP: Patient, No Pcp Per    Brief Narrative:  Theresa Kirby a 61 y.o.femalewith medical history significant of ?? hypothyroidism; OSA on CPAP sometimes; asthma; and arthritis presenting with dyspnea on exertion. She is admitted for acute CHF.    Assessment & Plan:   Principal Problem:   Acute respiratory failure with hypoxia (HCC) Active Problems:   Morbid obesity (HCC)   CHF exacerbation (HCC)   Hypothyroidism   OSA (obstructive sleep apnea)   Elevated blood pressure reading without diagnosis of hypertension   Acute congestive heart failure ([place])   Euthyroid sick syndrome  #1 acute respiratory failure with hypoxia secondary to acute diastolicCHF exacerbation Patient with improvement with shortness of breath however clinically still volume overloaded. Patient is still hypoxic on room air with ambulation. IV Lasix dose has been increased per cardiology. ACE inhibitor held to allow aggressive diuresis per cardiology. 2-D echo with EF of 77-82%, grade 2 diastolic dysfunction, mildly D shaped interventricular septum suggestive of right ventricular pressure/volume overload. Weight loss and compliance with CPAP stressed to patient. Cardiology following and appreciate input and recommendations.  #2 morbid obesity Weight loss has been emphasized to patient.  #3 elevated blood pressure Blood pressure improved on current dose of Lasix however now borderline. Monitor closely.  #4 elevated TSH/Euthyroid sick syndrome TSH of 5.971. Free T4 at 0.90. Likely euthyroid sick syndrome. Outpatient follow-up with repeat thyroid function studies in 4-6 weeks.  #5 obstructive sleep apnea CPAP QHS  #6 ?? New onset diabetes mellitus Hemoglobin A1c 6.9.CBGs 122-133. Continue sliding scale insulin.   DVT prophylaxis: Lovenox Code Status: full Family Communication: updated patient. No family at  bedside. Disposition Plan: home once patient is adequately diuresed and per cardiology.   Consultants:   Cardiology: Dr. Marlou Porch 03/14/2017  Procedures:   CXR 03/13/2017  2 d echo 03/14/2017  Antimicrobials:   none   Subjective: Patient sitting up in chair. No chest pain. Patient states cramping in hands has improved. Patient states shortness of breath improving.   Objective: Vitals:   03/16/17 1133 03/16/17 2146 03/17/17 0656 03/17/17 1123  BP: (!) 137/51 130/83 133/75 (!) 93/45  Pulse: (!) 56 66 69 69  Resp: 18 18 18 18   Temp: 98 F (36.7 C) 98.1 F (36.7 C) 97.7 F (36.5 C) 98 F (36.7 C)  TempSrc: Oral Oral Oral Oral  SpO2: 95% 98% 94% 95%  Weight:   (!) 152.7 kg (336 lb 11.2 oz)   Height:        Intake/Output Summary (Last 24 hours) at 03/17/17 1307 Last data filed at 03/17/17 1124  Gross per 24 hour  Intake              960 ml  Output             3300 ml  Net            -2340 ml   Filed Weights   03/15/17 0256 03/16/17 0656 03/17/17 0656  Weight: (!) 156.1 kg (344 lb 1.6 oz) (!) 153.7 kg (338 lb 14.4 oz) (!) 152.7 kg (336 lb 11.2 oz)    Examination:  General exam: NAD Respiratory system: Bibasilar crackles. Cardiovascular system: S1 & S2 heard, RRR. No JVD, murmurs, rubs, gallops or clicks. 2+ bilateral lower extremity edema. Gastrointestinal system: Abdomen is nondistended, soft and nontender. No organomegaly or masses felt. Normal bowel sounds heard. Central nervous system: Alert and oriented.  No focal neurological deficits. Extremities: Symmetric 5 x 5 power. Skin: No rashes, lesions or ulcers Psychiatry: Judgement and insight appear fair. Mood & affect appropriate.     Data Reviewed: I have personally reviewed following labs and imaging studies  CBC:  Recent Labs Lab 03/13/17 1618 03/14/17 0427 03/16/17 0411  WBC 7.3 6.7 5.4  NEUTROABS 5.1 4.6  --   HGB 13.9 13.9 14.3  HCT 45.5 47.8* 50.0*  MCV 85.5 87.2 89.4  PLT 223 219 341    Basic Metabolic Panel:  Recent Labs Lab 03/13/17 1618 03/14/17 0427 03/15/17 0324 03/15/17 0942 03/16/17 0411 03/17/17 0629  NA 141 143 141  --  139 140  K 4.6 4.1 4.4  --  3.8 3.8  CL 101 99* 92*  --  90* 90*  CO2 32 40* 40*  --  40* 41*  GLUCOSE 93 117* 119*  --  117* 118*  BUN 23* 19 20  --  19 21*  CREATININE 0.94 0.99 0.94  --  0.86 0.98  CALCIUM 8.8* 8.6* 9.0  --  8.9 9.0  MG  --   --   --  2.3  --   --    GFR: Estimated Creatinine Clearance: 90.5 mL/min (by C-G formula based on SCr of 0.98 mg/dL). Liver Function Tests:  Recent Labs Lab 03/13/17 1618  AST 32  ALT 19  ALKPHOS 39  BILITOT 0.9  PROT 6.5  ALBUMIN 3.3*    Recent Labs Lab 03/13/17 1618  LIPASE 20   No results for input(s): AMMONIA in the last 168 hours. Coagulation Profile: No results for input(s): INR, PROTIME in the last 168 hours. Cardiac Enzymes: No results for input(s): CKTOTAL, CKMB, CKMBINDEX, TROPONINI in the last 168 hours. BNP (last 3 results) No results for input(s): PROBNP in the last 8760 hours. HbA1C: No results for input(s): HGBA1C in the last 72 hours. CBG:  Recent Labs Lab 03/16/17 1111 03/16/17 1633 03/16/17 2120 03/17/17 0737 03/17/17 1121  GLUCAP 134* 116* 116* 133* 122*   Lipid Profile: No results for input(s): CHOL, HDL, LDLCALC, TRIG, CHOLHDL, LDLDIRECT in the last 72 hours. Thyroid Function Tests:  Recent Labs  03/16/17 0411  FREET4 0.90  T3FREE 2.8   Anemia Panel: No results for input(s): VITAMINB12, FOLATE, FERRITIN, TIBC, IRON, RETICCTPCT in the last 72 hours. Sepsis Labs: No results for input(s): PROCALCITON, LATICACIDVEN in the last 168 hours.  No results found for this or any previous visit (from the past 240 hour(s)).       Radiology Studies: No results found.      Scheduled Meds: . aspirin EC  81 mg Oral Daily  . chlorhexidine  15 mL Mouth Rinse BID  . enoxaparin (LOVENOX) injection  0.5 mg/kg Subcutaneous Q24H  .  furosemide  80 mg Intravenous BID  . insulin aspart  0-15 Units Subcutaneous TID WC  . mouth rinse  15 mL Mouth Rinse q12n4p  . sodium chloride flush  3 mL Intravenous Q12H   Continuous Infusions: . sodium chloride       LOS: 4 days    Time spent: 83 mins    THOMPSON,DANIEL, MD [facility] Pager [phone]  If 7PM-7AM, please contact night-coverage [email or web address] Password [email or web address] 03/17/2017, 1:07 PM

## 2017-03-17 NOTE — Progress Notes (Signed)
Nutrition Brief Note  RD consulted for diet education of newly diagnosed diabetes and CHF. RD previously provided education on 03/14/17 please refer to education note. RD answered current pt questions.  No nutrition interventions warranted at this time. If nutrition issues arise, please consult RD.   Parks Ranger, RDN 03/17/2017 12:14 PM

## 2017-03-18 DIAGNOSIS — E0781 Sick-euthyroid syndrome: Secondary | ICD-10-CM

## 2017-03-18 DIAGNOSIS — I5031 Acute diastolic (congestive) heart failure: Secondary | ICD-10-CM

## 2017-03-18 DIAGNOSIS — E039 Hypothyroidism, unspecified: Secondary | ICD-10-CM

## 2017-03-18 LAB — BASIC METABOLIC PANEL
ANION GAP: 12 (ref 5–15)
BUN: 21 mg/dL — ABNORMAL HIGH (ref 6–20)
CHLORIDE: 88 mmol/L — AB (ref 101–111)
CO2: 38 mmol/L — AB (ref 22–32)
Calcium: 8.9 mg/dL (ref 8.9–10.3)
Creatinine, Ser: 1 mg/dL (ref 0.44–1.00)
GFR calc Af Amer: >60 mL/min (ref >60)
GFR, EST NON AFRICAN AMERICAN: 60 mL/min — AB (ref >60)
GLUCOSE: 121 mg/dL — AB (ref 65–99)
POTASSIUM: 3.4 mmol/L — AB (ref 3.5–5.1)
Sodium: 138 mmol/L (ref 135–145)

## 2017-03-18 LAB — GLUCOSE, CAPILLARY
GLUCOSE-CAPILLARY: 122 mg/dL — AB (ref 65–99)
GLUCOSE-CAPILLARY: 129 mg/dL — AB (ref 65–99)
Glucose-Capillary: 134 mg/dL — ABNORMAL HIGH (ref 65–99)
Glucose-Capillary: 124 mg/dL — ABNORMAL HIGH (ref 65–99)

## 2017-03-18 MED ORDER — POTASSIUM CHLORIDE CRYS ER 20 MEQ PO TBCR
40.0000 meq | EXTENDED_RELEASE_TABLET | Freq: Every day | ORAL | Status: DC
  Administered 2017-03-18 – 2017-03-19 (×2): 40 meq via ORAL
  Filled 2017-03-18 (×2): qty 2

## 2017-03-18 NOTE — Progress Notes (Signed)
Progress Note  Patient Name: Theresa Kirby Date of Encounter: 03/18/2017  Primary Cardiologist:  New to Dr. Marlou Porch  Subjective   Continues to have exertional DOE while walking, no chest pain.  Inpatient Medications    Scheduled Meds: . aspirin EC  81 mg Oral Daily  . chlorhexidine  15 mL Mouth Rinse BID  . enoxaparin (LOVENOX) injection  0.5 mg/kg Subcutaneous Q24H  . furosemide  80 mg Intravenous BID  . insulin aspart  0-15 Units Subcutaneous TID WC  . mouth rinse  15 mL Mouth Rinse q12n4p  . potassium chloride  40 mEq Oral Daily  . sodium chloride flush  3 mL Intravenous Q12H   Continuous Infusions: . sodium chloride     PRN Meds: sodium chloride, acetaminophen, albuterol, magnesium hydroxide, montelukast, ondansetron (ZOFRAN) IV, sodium chloride, sodium chloride flush   Vital Signs    Vitals:   03/17/17 0656 03/17/17 1123 03/17/17 1930 03/18/17 0511  BP: 133/75 (!) 93/45 120/73 119/74  Pulse: 69 69 67 67  Resp: 18 18 18 18   Temp: 97.7 F (36.5 C) 98 F (36.7 C) 97.9 F (36.6 C) 97.7 F (36.5 C)  TempSrc: Oral Oral Oral Oral  SpO2: 94% 95% 100% 100%  Weight: (!) 336 lb 11.2 oz (152.7 kg)   (!) 333 lb 3.2 oz (151.1 kg)  Height:        Intake/Output Summary (Last 24 hours) at 03/18/17 1016 Last data filed at 03/18/17 0933  Gross per 24 hour  Intake              960 ml  Output             2950 ml  Net            -1990 ml   Filed Weights   03/16/17 0656 03/17/17 0656 03/18/17 0511  Weight: (!) 338 lb 14.4 oz (153.7 kg) (!) 336 lb 11.2 oz (152.7 kg) (!) 333 lb 3.2 oz (151.1 kg)    Telemetry    Sinus rhythm - Personally Reviewed   Physical Exam   GEN: Well nourished, well developed, in no acute distress  HEENT: normal  Neck: no JVD, carotid bruits, or masses Cardiac: RRR; no murmurs, rubs, or gallops, +chronic edema  Respiratory:  clear to auscultation bilaterally, normal work of breathing GI: soft, nontender, nondistended, + BS, obese MS: no  deformity or atrophy  Skin: warm and dry, no rash Neuro:  Alert and Oriented x 3, Strength and sensation are intact Psych: euthymic mood, full affect    Labs    Chemistry  Recent Labs Lab 03/13/17 1618  03/16/17 0411 03/17/17 0629 03/18/17 0356  NA 141  < > 139 140 138  K 4.6  < > 3.8 3.8 3.4*  CL 101  < > 90* 90* 88*  CO2 32  < > 40* 41* 38*  GLUCOSE 93  < > 117* 118* 121*  BUN 23*  < > 19 21* 21*  CREATININE 0.94  < > 0.86 0.98 1.00  CALCIUM 8.8*  < > 8.9 9.0 8.9  PROT 6.5  --   --   --   --   ALBUMIN 3.3*  --   --   --   --   AST 32  --   --   --   --   ALT 19  --   --   --   --   ALKPHOS 39  --   --   --   --  BILITOT 0.9  --   --   --   --   GFRNONAA >60  < > >60 >60 60*  GFRAA >60  < > >60 >60 >60  ANIONGAP 8  < > 9 9 12   < > = values in this interval not displayed.   Hematology  Recent Labs Lab 03/13/17 1618 03/14/17 0427 03/16/17 0411  WBC 7.3 6.7 5.4  RBC 5.32* 5.48* 5.59*  HGB 13.9 13.9 14.3  HCT 45.5 47.8* 50.0*  MCV 85.5 87.2 89.4  MCH 26.1 25.4* 25.6*  MCHC 30.5 29.1* 28.6*  RDW 17.0* 17.2* 16.9*  PLT 223 219 200    Cardiac EnzymesNo results for input(s): TROPONINI in the last 168 hours.   Recent Labs Lab 03/13/17 1624  TROPIPOC 0.01     BNP  Recent Labs Lab 03/13/17 1618  BNP 210.2*     DDimer No results for input(s): DDIMER in the last 168 hours.   Radiology    No results found.  Cardiac Studies   03/14/2017 ECHOCARDIOGRAM Study Conclusions  - Left ventricle: The cavity size was normal. Wall thickness was   normal. Systolic function was normal. The estimated ejection   fraction was in the range of 60% to 65%. Although no diagnostic   regional wall motion abnormality was identified, this possibility   cannot be completely excluded on the basis of this study.   Features are consistent with a pseudonormal left ventricular   filling pattern, with concomitant abnormal relaxation and   increased filling pressure (grade  2 diastolic dysfunction). - Ventricular septum: Mildly D-shaped interventricular septum   suggestive of RV pressure/volume overload. - Aortic valve: There was no stenosis. - Mitral valve: There was no significant regurgitation. - Right ventricle: The cavity size was mildly dilated. Systolic   function was normal. - Right atrium: The atrium was mildly dilated. - Pulmonary arteries: No complete TR doppler jet so unable to   estimate PA systolic pressure. - Systemic veins: IVC measured 2.0 cm with < 50% respirophasic   variation, suggesting RA pressure 8 mmHg.  Impressions:  - Normal LV size with EF 60-65%. Moderate diastolic dysfunction.   Mildly dilated RV with normal systolic function. Mildly D-shaped   interventricular septum suggesting RV pressure/volume overload.    Patient Profile     KESHIA WEARE is a 61 y.o. female with a hx of morbid obesity, OSA on CPAP (noncompliant), arthritis, hypothyroidism, asthma, iron deficiency anemia, non-traumatic rhabdo of RLE 2017, A1C 6.9 (without prior dx of DM) who is being seen for the evaluation of CHF at the request of Dr. Lorin Mercy.  Assessment & Plan    Acute diastolic HF  - continue IV lasix, increased to 80 BID  - 10 liters out  - EF normal  - signs of RV volume overload (obesity)  - ambulating hallway  Morbid obesity  - encourage wt loss.   - 358 to 336 lbs now  The patient was 316 lbs in 10/2017m since good diuresis and stable Crea I would continue iv diuresis.   Ena Dawley, MD

## 2017-03-18 NOTE — Progress Notes (Signed)
PROGRESS NOTE    Theresa Kirby  INO:[id number] DOB: [birth year] DOA: 03/13/2017 PCP: Patient, No Pcp Per    Brief Narrative:  Theresa Kirby a 61 y.o.femalewith medical history significant of ?? hypothyroidism; OSA on CPAP sometimes; asthma; and arthritis presenting with dyspnea on exertion. She is admitted for acute CHF.    Assessment & Plan:   Principal Problem:   Acute respiratory failure with hypoxia (HCC) Active Problems:   Morbid obesity (HCC)   CHF exacerbation (HCC)   Hypothyroidism   OSA (obstructive sleep apnea)   Elevated blood pressure reading without diagnosis of hypertension   Acute congestive heart failure ([place])   Euthyroid sick syndrome  #1 acute respiratory failure with hypoxia secondary to acute diastolicCHF exacerbation Patient with improvement with shortness of breath however clinically still volume overloaded and hypoxic on ambulation. IV Lasix dose has been increased per cardiology. ACE inhibitor held to allow aggressive diuresis per cardiology. 2-D echo with EF of 09-62%, grade 2 diastolic dysfunction, mildly D shaped interventricular septum suggestive of right ventricular pressure/volume overload. Weight loss and compliance with CPAP stressed to patient. Cardiology following and appreciate input and recommendations.  #2 morbid obesity Weight loss has been emphasized to patient.  #3 elevated blood pressure Improved on current dose of Lasix. Outpatient follow-up.  #4 elevated TSH/Euthyroid sick syndrome TSH of 5.971. Free T4 at 0.90. Likely euthyroid sick syndrome. Outpatient follow-up with repeat thyroid function studies in 4-6 weeks.  #5 obstructive sleep apnea CPAP QHS. compliance with CPAP stressed to patient.   #6 ?? New onset diabetes mellitus Hemoglobin A1c 6.9.CBGs 122-134. Continue sliding scale insulin.   DVT prophylaxis: Lovenox Code Status: full Family Communication: updated patient. No family at bedside. Disposition Plan: home  once patient is adequately diuresed and per cardiology.   Consultants:   Cardiology: Dr. Marlou Porch 03/14/2017  Procedures:   CXR 03/13/2017  2 d echo 03/14/2017  Antimicrobials:   none   Subjective: Patient denies any shortness of breath. No chest pain. Cramping in hands improving.   Objective: Vitals:   03/17/17 0656 03/17/17 1123 03/17/17 1930 03/18/17 0511  BP: 133/75 (!) 93/45 120/73 119/74  Pulse: 69 69 67 67  Resp: 18 18 18 18   Temp: 97.7 F (36.5 C) 98 F (36.7 C) 97.9 F (36.6 C) 97.7 F (36.5 C)  TempSrc: Oral Oral Oral Oral  SpO2: 94% 95% 100% 100%  Weight: (!) 152.7 kg (336 lb 11.2 oz)   (!) 151.1 kg (333 lb 3.2 oz)  Height:        Intake/Output Summary (Last 24 hours) at 03/18/17 1141 Last data filed at 03/18/17 0933  Gross per 24 hour  Intake              960 ml  Output             2250 ml  Net            -1290 ml   Filed Weights   03/16/17 0656 03/17/17 0656 03/18/17 0511  Weight: (!) 153.7 kg (338 lb 14.4 oz) (!) 152.7 kg (336 lb 11.2 oz) (!) 151.1 kg (333 lb 3.2 oz)    Examination:  General exam: Sitting up in no acute distress. Respiratory system: Bibasilar crackles. Cardiovascular system: S1 & S2 heard, RRR. No JVD, murmurs, rubs, gallops or clicks. 2+ bilateral lower extremity edema. Gastrointestinal system: Abdomen is soft, nontender, nondistended, no hepatosplenomegaly, positive bowel sounds.  Central nervous system: Alert and oriented. No focal neurological deficits. Extremities: Symmetric  5 x 5 power. Skin: No rashes, lesions or ulcers Psychiatry: Judgement and insight appear fair. Mood & affect appropriate.     Data Reviewed: I have personally reviewed following labs and imaging studies  CBC:  Recent Labs Lab 03/13/17 1618 03/14/17 0427 03/16/17 0411  WBC 7.3 6.7 5.4  NEUTROABS 5.1 4.6  --   HGB 13.9 13.9 14.3  HCT 45.5 47.8* 50.0*  MCV 85.5 87.2 89.4  PLT 223 219 347   Basic Metabolic Panel:  Recent Labs Lab  03/14/17 0427 03/15/17 0324 03/15/17 0942 03/16/17 0411 03/17/17 0629 03/18/17 0356  NA 143 141  --  139 140 138  K 4.1 4.4  --  3.8 3.8 3.4*  CL 99* 92*  --  90* 90* 88*  CO2 40* 40*  --  40* 41* 38*  GLUCOSE 117* 119*  --  117* 118* 121*  BUN 19 20  --  19 21* 21*  CREATININE 0.99 0.94  --  0.86 0.98 1.00  CALCIUM 8.6* 9.0  --  8.9 9.0 8.9  MG  --   --  2.3  --   --   --    GFR: Estimated Creatinine Clearance: 88.1 mL/min (by C-G formula based on SCr of 1 mg/dL). Liver Function Tests:  Recent Labs Lab 03/13/17 1618  AST 32  ALT 19  ALKPHOS 39  BILITOT 0.9  PROT 6.5  ALBUMIN 3.3*    Recent Labs Lab 03/13/17 1618  LIPASE 20   No results for input(s): AMMONIA in the last 168 hours. Coagulation Profile: No results for input(s): INR, PROTIME in the last 168 hours. Cardiac Enzymes: No results for input(s): CKTOTAL, CKMB, CKMBINDEX, TROPONINI in the last 168 hours. BNP (last 3 results) No results for input(s): PROBNP in the last 8760 hours. HbA1C: No results for input(s): HGBA1C in the last 72 hours. CBG:  Recent Labs Lab 03/17/17 1121 03/17/17 1643 03/17/17 2102 03/18/17 0751 03/18/17 1131  GLUCAP 122* 118* 125* 134* 122*   Lipid Profile: No results for input(s): CHOL, HDL, LDLCALC, TRIG, CHOLHDL, LDLDIRECT in the last 72 hours. Thyroid Function Tests:  Recent Labs  03/16/17 0411  FREET4 0.90  T3FREE 2.8   Anemia Panel: No results for input(s): VITAMINB12, FOLATE, FERRITIN, TIBC, IRON, RETICCTPCT in the last 72 hours. Sepsis Labs: No results for input(s): PROCALCITON, LATICACIDVEN in the last 168 hours.  No results found for this or any previous visit (from the past 240 hour(s)).       Radiology Studies: No results found.      Scheduled Meds: . aspirin EC  81 mg Oral Daily  . chlorhexidine  15 mL Mouth Rinse BID  . enoxaparin (LOVENOX) injection  0.5 mg/kg Subcutaneous Q24H  . furosemide  80 mg Intravenous BID  . insulin aspart   0-15 Units Subcutaneous TID WC  . mouth rinse  15 mL Mouth Rinse q12n4p  . potassium chloride  40 mEq Oral Daily  . sodium chloride flush  3 mL Intravenous Q12H   Continuous Infusions: . sodium chloride       LOS: 5 days    Time spent: 18 mins    Sameka Bagent, MD [facility] Pager [phone]  If 7PM-7AM, please contact night-coverage [email or web address] Password [facility] 03/18/2017, 11:41 AM

## 2017-03-19 DIAGNOSIS — R030 Elevated blood-pressure reading, without diagnosis of hypertension: Secondary | ICD-10-CM

## 2017-03-19 LAB — BASIC METABOLIC PANEL
ANION GAP: 11 (ref 5–15)
BUN: 22 mg/dL — AB (ref 6–20)
CHLORIDE: 89 mmol/L — AB (ref 101–111)
CO2: 38 mmol/L — ABNORMAL HIGH (ref 22–32)
Calcium: 9.2 mg/dL (ref 8.9–10.3)
Creatinine, Ser: 1.06 mg/dL — ABNORMAL HIGH (ref 0.44–1.00)
GFR calc Af Amer: >60 mL/min (ref >60)
GFR, EST NON AFRICAN AMERICAN: 56 mL/min — AB (ref >60)
Glucose, Bld: 131 mg/dL — ABNORMAL HIGH (ref 65–99)
POTASSIUM: 3.3 mmol/L — AB (ref 3.5–5.1)
SODIUM: 138 mmol/L (ref 135–145)

## 2017-03-19 LAB — GLUCOSE, CAPILLARY
GLUCOSE-CAPILLARY: 139 mg/dL — AB (ref 65–99)
GLUCOSE-CAPILLARY: 121 mg/dL — AB (ref 65–99)
GLUCOSE-CAPILLARY: 138 mg/dL — AB (ref 65–99)

## 2017-03-19 MED ORDER — POTASSIUM CHLORIDE CRYS ER 20 MEQ PO TBCR: 40.0000 meq | EXTENDED_RELEASE_TABLET | Freq: Two times a day (BID) | ORAL | 0 refills | Status: DC

## 2017-03-19 MED ORDER — ASPIRIN 81 MG PO TBEC: 81.0000 mg | DELAYED_RELEASE_TABLET | Freq: Every day | ORAL | Status: AC

## 2017-03-19 MED ORDER — ENOXAPARIN SODIUM 80 MG/0.8ML ~~LOC~~ SOLN
75.0000 mg | SUBCUTANEOUS | Status: DC
  Administered 2017-03-19: 75 mg via SUBCUTANEOUS
  Filled 2017-03-19: qty 0.8

## 2017-03-19 MED ORDER — FUROSEMIDE 80 MG PO TABS: 80.0000 mg | ORAL_TABLET | Freq: Two times a day (BID) | ORAL | 0 refills | Status: DC

## 2017-03-19 MED ORDER — POTASSIUM CHLORIDE CRYS ER 20 MEQ PO TBCR
40.0000 meq | EXTENDED_RELEASE_TABLET | Freq: Once | ORAL | Status: AC
  Administered 2017-03-19: 40 meq via ORAL
  Filled 2017-03-19: qty 2

## 2017-03-19 NOTE — Progress Notes (Signed)
Discharge instructions reviewed with patient, questions answered, verbalized understanding.  Reviewed heart failure booklet including daily weights and zone tool with patient, questions answered, verbalized understanding.  Patient awaiting daughter for ride home.

## 2017-03-19 NOTE — Progress Notes (Signed)
When check to place on CPAP, patient already on and sleeping well. No issues at this time.

## 2017-03-19 NOTE — Progress Notes (Signed)
SATURATION QUALIFICATIONS: (This note is used to comply with regulatory documentation for home oxygen)  Patient Saturations on Room Air at Rest = 97%  Patient Saturations on Room Air while Ambulating = 93%  Patient Saturations on n/a Liters of oxygen while Ambulating =  n/a Please briefly explain why patient needs home oxygen: does not require home oxygen

## 2017-03-19 NOTE — Discharge Summary (Signed)
Physician Discharge Summary  Theresa MCAULIFFE JOI:[id number] DOB: [birth year] DOA: 03/13/2017  PCP: Patient, No Pcp Per  Admit date: 03/13/2017 Discharge date: 03/19/2017  Time spent: 65  minutes  Recommendations for Outpatient Follow-up:  1. Follow-up with Dr Marlou Porch, cardiology in 2 weeks. On follow-up patient will need a basic metabolic profile done to follow-up on electrolytes and renal function. 2. Follow-up with PCP in 2 weeks. Patient will need repeat TFTs in 4-6 weeks.New onset DM will need to be reassesed and management started.   Discharge Diagnoses:  Principal Problem:   Acute respiratory failure with hypoxia (HCC) Active Problems:   Morbid obesity (HCC)   CHF exacerbation (HCC)   Hypothyroidism   OSA (obstructive sleep apnea)   Elevated blood pressure reading without diagnosis of hypertension   Acute congestive heart failure ([place])   Euthyroid sick syndrome   Discharge Condition: Stable and improved.  Diet recommendation: Heart healthy.  Filed Weights   03/17/17 0656 03/18/17 0511 03/19/17 0603  Weight: (!) 152.7 kg (336 lb 11.2 oz) (!) 151.1 kg (333 lb 3.2 oz) (!) 150.6 kg (332 lb 1.6 oz)    History of present illness:  Per Dr. Dellia Cloud Theresa Kirby is a 61 y.o. female with medical history significant of hypothyroidism; OSA on CPAP sometimes; asthma; and arthritis presented with c/o SOB.  "The more I moved, the more I couldn't breathe".  Symptoms started slowly, "kind of crept up on me", but really noticed it over the weekend.  Noticed her face was swollen, slept all day and just haven't bounced back.  She called her PCP and asked for an apopiintment.  Also with abdominal pain and R hand tremor, burping air.  Not peeing as much as usual.  She went to the appt today and they sent her to the ER.  O2 saturation was too low, 82%.  SOB is worse with exertion and talking.  No chest pain.  Abdomen is very tight.  Midepigastric and RUQ pain.  +LE edema for about a week.  Weight  gain of 20 pounds since May.  Propping herself with pillows so she doesn't lie flat.  Feels anxious at night, which keeps her awake.     ED Course: Dyspnea/hypoxia requiring 3L [place] O2.  PE and CXR with e/o volume overload.  CHF, 40 mg IV Lasix, admission.   Hospital Course:  #1 acute respiratory failure with hypoxia secondary to acute diastolicCHF exacerbation Patient was admitted with acute respiratory failure felt to be secondary to acute diastolic CHF in the setting of obstructive sleep apnea and obesity. Patient was admitted placed empirically on IV Lasix and cardiology consulted who followed the patient throughout the hospitalization. Patient diuresed well during the process of diuresis patient's sats were checked on room and on ambulation and initially patient was noted to be hypoxic on ambulation. Patient diuresed -12.205 L during this hospitalization.  ACE inhibitor held to allow aggressive diuresis per cardiology. 2-D echo with EF of 47-09%, grade 2 diastolic dysfunction, mildly D shaped interventricular septum suggestive of right ventricular pressure/volume overload. Weight loss and compliance with CPAP stressed to patient. Patient will be discharged on Lasix 80 mg twice daily with close outpatient follow-up with cardiology. On day of discharge patient's sats were greater than 90% on ambulation.  #2 morbid obesity Weight loss has been emphasized to patient.  #3 elevated blood pressure Improved on current dose of Lasix. Outpatient follow-up.  #4 elevated TSH/Euthyroid sick syndrome TSH of 5.971. Free T4 at 0.90. Likely euthyroid  sick syndrome. Outpatient follow-up with repeat thyroid function studies in 4-6 weeks.  #5 obstructive sleep apnea CPAP QHS. compliance with CPAP stressed to patient.   #6 ?? New onset diabetes mellitus Hemoglobin A1c 6.9.patient maintained on a sliding scale insulin. Outpatient follow-up for treatment and management.    Procedures:  CXR  03/13/2017  2 d echo 03/14/2017  Consultations:  Cardiology: Dr. Marlou Porch 03/14/2017  Discharge Exam: Vitals:   03/18/17 1927 03/19/17 0603  BP: 132/78 (!) 142/57  Pulse: 72 79  Resp: 18 18  Temp: 97.8 F (36.6 C) 98.4 F (36.9 C)  SpO2: 98% 96%    General: NAD Cardiovascular: RRR. Trace bilateral lower extremity edema. Respiratory: CTAB  Discharge Instructions   Discharge Instructions    Diet - low sodium heart healthy    Complete by:  As directed    Increase activity slowly    Complete by:  As directed      Current Discharge Medication List    START taking these medications   Details  aspirin EC 81 MG EC tablet Take 1 tablet (81 mg total) by mouth daily.    furosemide (LASIX) 80 MG tablet Take 1 tablet (80 mg total) by mouth 2 (two) times daily. Qty: 60 tablet, Refills: 0    potassium chloride SA (K-DUR,KLOR-CON) 20 MEQ tablet Take 2 tablets (40 mEq total) by mouth 2 (two) times daily. Qty: 120 tablet, Refills: 0      CONTINUE these medications which have NOT CHANGED   Details  acetaminophen (TYLENOL) 325 MG tablet Take 650 mg by mouth every 6 (six) hours as needed (pain).     albuterol (PROAIR HFA) 108 (90 BASE) MCG/ACT inhaler Inhale 2 puffs into the lungs every 6 (six) hours as needed for wheezing or shortness of breath.     diclofenac sodium (VOLTAREN) 1 % GEL Apply 2 g topically 2 (two) times daily as needed (pain). Applies to knees.    ferrous sulfate 325 (65 FE) MG tablet Take 325 mg by mouth 3 (three) times daily with meals.    montelukast (SINGULAIR) 10 MG tablet Take 10 mg by mouth at bedtime as needed (allergies.).     Multiple Vitamin (MULTIVITAMIN) capsule Take 1 capsule by mouth daily.    Omega-3 Fatty Acids (FISH OIL PO) Take 1 capsule by mouth daily.       Allergies  Allergen Reactions  . Sulfa Antibiotics Hives and Shortness Of Breath   Follow-up Information    [facility] Follow up.   Specialty:  [place] Why:  They will do your home health care at your home Contact information: [address] [place] [address] [phone]        Jerline Pain, MD Follow up.   Specialty:  Cardiology Why:  The office will call to arrange follow up with Dr. Marlou Porch or a PA/NP Contact information: [address] [place] [address] [phone]        pcp. Schedule an appointment as soon as possible for a visit in 2 week(s).   Why:  f/u with PCP in 2 weeks.       [facility] Follow up.   Why:  3N1 Contact information: [address] [place]  [address] [phone]            The results of significant diagnostics from this hospitalization (including imaging, microbiology, ancillary and laboratory) are listed below for reference.  Significant Diagnostic Studies: Dg Chest Port 1 View  Result Date: 03/13/2017 CLINICAL DATA:  Worsening shortness of breath. EXAM: PORTABLE CHEST 1 VIEW COMPARISON:  CT 05/07/2016.  Chest x-ray 05/05/2016 . FINDINGS: Cardiomegaly with bilateral pulmonary interstitial prominence consistent with congestive heart failure. No pleural effusion or pneumothorax. IMPRESSION: Congestive heart failure with bilateral pulmonary interstitial edema. Electronically Signed   By: Marcello Moores  Register   On: 03/13/2017 16:33    Microbiology: No results found for this or any previous visit (from the past 240 hour(s)).   Labs: Basic Metabolic Panel:  Recent Labs Lab 03/15/17 0324 03/15/17 2956 03/16/17 0411 03/17/17 0629 03/18/17 0356 03/19/17 0342  NA 141  --  139 140 138 138  K 4.4  --  3.8 3.8 3.4* 3.3*  CL 92*  --  90* 90* 88* 89*  CO2 40*  --  40* 41* 38* 38*  GLUCOSE 119*  --  117* 118* 121* 131*  BUN 20  --  19 21* 21* 22*  CREATININE 0.94  --  0.86 0.98 1.00 1.06*  CALCIUM 9.0  --  8.9 9.0 8.9 9.2  MG  --  2.3  --   --   --   --    Liver Function Tests:  Recent Labs Lab 03/13/17 1618   AST 32  ALT 19  ALKPHOS 39  BILITOT 0.9  PROT 6.5  ALBUMIN 3.3*    Recent Labs Lab 03/13/17 1618  LIPASE 20   No results for input(s): AMMONIA in the last 168 hours. CBC:  Recent Labs Lab 03/13/17 1618 03/14/17 0427 03/16/17 0411  WBC 7.3 6.7 5.4  NEUTROABS 5.1 4.6  --   HGB 13.9 13.9 14.3  HCT 45.5 47.8* 50.0*  MCV 85.5 87.2 89.4  PLT 223 219 200   Cardiac Enzymes: No results for input(s): CKTOTAL, CKMB, CKMBINDEX, TROPONINI in the last 168 hours. BNP: BNP (last 3 results)  Recent Labs  03/13/17 1618  BNP 210.2*    ProBNP (last 3 results) No results for input(s): PROBNP in the last 8760 hours.  CBG:  Recent Labs Lab 03/18/17 1131 03/18/17 1631 03/18/17 2104 03/19/17 0726 03/19/17 1131  GLUCAP 122* 124* 129* 139* 121*       Signed:  THOMPSON,DANIEL MD.  [facility] 03/19/2017, 2:11 PM

## 2017-03-19 NOTE — Care Management Note (Signed)
Case Management Note  Patient Details  Name: Theresa Kirby MRN: [id number] Date of Birth: 01/01/1956  Subjective/Objective: pt will be discharged today with HHPT([facility] per her choice), and 3n1 to be delivered by [facility].                    Action/Plan: CM will sign off for now but will be available should additional discharge needs arise or disposition change.    Expected Discharge Date:                  Expected Discharge Plan:  [place]  In-House Referral:     Discharge planning Services  CM Consult  Post Acute Care Choice:  Durable Medical Equipment, Home Health Choice offered to:  Patient  DME Arranged:  3-N-1 DME Agency:  [place]:  PT [place]:  [facility]  Status of Service:  Completed, signed off  If discussed at [place] of Stay Meetings, dates discussed:    Additional Comments:  Delrae Sawyers, RN 03/19/2017, 1:52 PM

## 2017-03-19 NOTE — Progress Notes (Signed)
Progress Note  Patient Name: Theresa Kirby Date of Encounter: 03/19/2017  Primary Cardiologist:  New to Dr. Marlou Porch  Subjective   She feels better, improved SOB, improved dizziness.  Inpatient Medications    Scheduled Meds: . aspirin EC  81 mg Oral Daily  . chlorhexidine  15 mL Mouth Rinse BID  . enoxaparin (LOVENOX) injection  75 mg Subcutaneous Q24H  . furosemide  80 mg Intravenous BID  . insulin aspart  0-15 Units Subcutaneous TID WC  . mouth rinse  15 mL Mouth Rinse q12n4p  . potassium chloride  40 mEq Oral Daily  . potassium chloride  40 mEq Oral Once  . sodium chloride flush  3 mL Intravenous Q12H   Continuous Infusions: . sodium chloride     PRN Meds: sodium chloride, acetaminophen, albuterol, magnesium hydroxide, montelukast, ondansetron (ZOFRAN) IV, sodium chloride, sodium chloride flush   Vital Signs    Vitals:   03/18/17 0511 03/18/17 1152 03/18/17 1927 03/19/17 0603  BP: 119/74 138/76 132/78 (!) 142/57  Pulse: 67 74 72 79  Resp: 18 20 18 18   Temp: 97.7 F (36.5 C) 98.3 F (36.8 C) 97.8 F (36.6 C) 98.4 F (36.9 C)  TempSrc: Oral Oral Oral Oral  SpO2: 100% 94% 98% 96%  Weight: (!) 333 lb 3.2 oz (151.1 kg)   (!) 332 lb 1.6 oz (150.6 kg)  Height:        Intake/Output Summary (Last 24 hours) at 03/19/17 0855 Last data filed at 03/19/17 0649  Gross per 24 hour  Intake              840 ml  Output             1850 ml  Net            -1010 ml   Filed Weights   03/17/17 0656 03/18/17 0511 03/19/17 0603  Weight: (!) 336 lb 11.2 oz (152.7 kg) (!) 333 lb 3.2 oz (151.1 kg) (!) 332 lb 1.6 oz (150.6 kg)    Telemetry    Sinus rhythm - Personally Reviewed   Physical Exam   GEN: Well nourished, well developed, in no acute distress  HEENT: normal  Neck: no JVD, carotid bruits, or masses Cardiac: RRR; no murmurs, rubs, or gallops, B/L mild edema  Respiratory:  clear to auscultation bilaterally, normal work of breathing GI: soft, nontender,  nondistended, + BS, obese MS: no deformity or atrophy  Skin: warm and dry, no rash Neuro:  Alert and Oriented x 3, Strength and sensation are intact Psych: euthymic mood, full affect  Labs    Chemistry  Recent Labs Lab 03/13/17 1618  03/17/17 0629 03/18/17 0356 03/19/17 0342  NA 141  < > 140 138 138  K 4.6  < > 3.8 3.4* 3.3*  CL 101  < > 90* 88* 89*  CO2 32  < > 41* 38* 38*  GLUCOSE 93  < > 118* 121* 131*  BUN 23*  < > 21* 21* 22*  CREATININE 0.94  < > 0.98 1.00 1.06*  CALCIUM 8.8*  < > 9.0 8.9 9.2  PROT 6.5  --   --   --   --   ALBUMIN 3.3*  --   --   --   --   AST 32  --   --   --   --   ALT 19  --   --   --   --   ALKPHOS 39  --   --   --   --  BILITOT 0.9  --   --   --   --   GFRNONAA >60  < > >60 60* 56*  GFRAA >60  < > >60 >60 >60  ANIONGAP 8  < > 9 12 11   < > = values in this interval not displayed.   Hematology  Recent Labs Lab 03/13/17 1618 03/14/17 0427 03/16/17 0411  WBC 7.3 6.7 5.4  RBC 5.32* 5.48* 5.59*  HGB 13.9 13.9 14.3  HCT 45.5 47.8* 50.0*  MCV 85.5 87.2 89.4  MCH 26.1 25.4* 25.6*  MCHC 30.5 29.1* 28.6*  RDW 17.0* 17.2* 16.9*  PLT 223 219 200    Cardiac EnzymesNo results for input(s): TROPONINI in the last 168 hours.   Recent Labs Lab 03/13/17 1624  TROPIPOC 0.01     BNP  Recent Labs Lab 03/13/17 1618  BNP 210.2*     DDimer No results for input(s): DDIMER in the last 168 hours.   Radiology    No results found.  Cardiac Studies   03/14/2017 ECHOCARDIOGRAM Study Conclusions  - Left ventricle: The cavity size was normal. Wall thickness was   normal. Systolic function was normal. The estimated ejection   fraction was in the range of 60% to 65%. Although no diagnostic   regional wall motion abnormality was identified, this possibility   cannot be completely excluded on the basis of this study.   Features are consistent with a pseudonormal left ventricular   filling pattern, with concomitant abnormal relaxation and    increased filling pressure (grade 2 diastolic dysfunction). - Ventricular septum: Mildly D-shaped interventricular septum   suggestive of RV pressure/volume overload. - Aortic valve: There was no stenosis. - Mitral valve: There was no significant regurgitation. - Right ventricle: The cavity size was mildly dilated. Systolic   function was normal. - Right atrium: The atrium was mildly dilated. - Pulmonary arteries: No complete TR doppler jet so unable to   estimate PA systolic pressure. - Systemic veins: IVC measured 2.0 cm with < 50% respirophasic   variation, suggesting RA pressure 8 mmHg.  Impressions:  - Normal LV size with EF 60-65%. Moderate diastolic dysfunction.   Mildly dilated RV with normal systolic function. Mildly D-shaped   interventricular septum suggesting RV pressure/volume overload.    Patient Profile     Theresa Kirby is a 62 y.o. female with a hx of morbid obesity, OSA on CPAP (noncompliant), arthritis, hypothyroidism, asthma, iron deficiency anemia, non-traumatic rhabdo of RLE 2017, A1C 6.9 (without prior dx of DM) who is being seen for the evaluation of CHF at the request of Dr. Lorin Mercy.  Assessment & Plan    Acute diastolic HF  - switch IV lasix to 80 BID Lasix PO BID  - 11 liters out  - EF normal  - ambulating hallway - apply for home PT - Crea stable, K being replaced  Morbid obesity  - encourage wt loss.   - 358 to 336 lbs now  Stable for a discharge today, we wil arrange for an outpatient follow up.   Ena Dawley, MD

## 2017-03-19 NOTE — Plan of Care (Signed)
Problem: Health Behavior/Discharge Planning: Goal: Ability to manage health-related needs will improve Outcome: Completed/Met Date Met: 03/19/17 Patient set up for home health follow-up; RN educated on heart failure utilizing heart failure booklet

## 2017-03-20 ENCOUNTER — Telehealth (HOSPITAL_COMMUNITY): Payer: Self-pay | Admitting: Surgery

## 2017-03-20 NOTE — Telephone Encounter (Signed)
I called patient to check on her after her recent hospitalization.  I left a message to call me back with concerns or questions related to HF.

## 2017-03-21 ENCOUNTER — Telehealth: Payer: Self-pay | Admitting: Cardiology

## 2017-03-21 NOTE — Telephone Encounter (Signed)
New message    TOC appt made per [facility] Form staff message. 9/17 at 10 am with Truitt Merle.

## 2017-03-21 NOTE — Telephone Encounter (Signed)
1st TCM Call: LMTCB.

## 2017-03-21 NOTE — Telephone Encounter (Signed)
**Note De-Identified Theresa Kirby Obfuscation** Patient contacted regarding discharge from [facility] on 03/19/17.  Patient understands to follow up with provider Truitt Merle on 04/10/17 at 10 am at [address] in [place]. Patient understands discharge instructions? yes Patient understands medications and regiment? yes Patient understands to bring all medications to this visit? Yes  The pt states that she is well at this time and has no complaints at this time. She does have this office phone number to call if she has any questions or concerns.

## 2017-04-03 ENCOUNTER — Telehealth: Payer: Self-pay | Admitting: Cardiology

## 2017-04-03 NOTE — Telephone Encounter (Signed)
Spoke with patient who is requesting a note for her work stating that she should remain out of work and when she can return.  She was d/ced 8/26.  She reports having a lot of dizziness and a h/a since being home.  She is taking Furosemide 80 mg BID as instructed.  She has not been checking her HR/BP.  Advised to obtain monitor at [facility]/pharmacy and monitor her VS esp when she is feeling dizzy.  Advised to c/b with those or bring them to her p/h appt.  Also advised to not drive if feeling dizzy.  Aware I will forward this to Dr Marlou Porch for review and will contact her about a letter for her work (she is a Development worker, community carrier).

## 2017-04-03 NOTE — Telephone Encounter (Signed)
New message    Pt is calling asking for a call back. She is asking about a note for work. Please call.

## 2017-04-05 NOTE — Telephone Encounter (Signed)
Hold the Lasix.  This may be the cause for the dizziness if she is having overdiuresis. Monitor weights.   She may remain out of work for the next 7 days.  Candee Furbish, MD

## 2017-04-05 NOTE — Telephone Encounter (Signed)
Left message to call back to discuss Dr Marlou Porch recommendations

## 2017-04-06 ENCOUNTER — Encounter: Payer: Self-pay | Admitting: *Deleted

## 2017-04-06 NOTE — Telephone Encounter (Signed)
Follow up     Pt is returning call to [facility]. Please call.

## 2017-04-06 NOTE — Telephone Encounter (Signed)
Spoke with pt who is aware to hold Lasix for now as it may be the cause of her dizziness.  Reviewed with her extensively to weigh daily, keep a written long and to call if weigh increase 2 lbs or more over night or 5 lbs over 3 days.  She will keep the appt as scheduled for Monday and wants to pick up the letter to remain out (per Dr Marlou Porch) for the next 7 days.

## 2017-04-10 ENCOUNTER — Ambulatory Visit (INDEPENDENT_AMBULATORY_CARE_PROVIDER_SITE_OTHER): Payer: Federal, State, Local not specified - PPO | Admitting: Nurse Practitioner

## 2017-04-10 ENCOUNTER — Encounter: Payer: Self-pay | Admitting: Nurse Practitioner

## 2017-04-10 VITALS — BP 138/72 | HR 79 | Ht 64.0 in | Wt 336.8 lb

## 2017-04-10 DIAGNOSIS — R938 Abnormal findings on diagnostic imaging of other specified body structures: Secondary | ICD-10-CM | POA: Diagnosis not present

## 2017-04-10 DIAGNOSIS — R0602 Shortness of breath: Secondary | ICD-10-CM | POA: Diagnosis not present

## 2017-04-10 DIAGNOSIS — R9389 Abnormal findings on diagnostic imaging of other specified body structures: Secondary | ICD-10-CM

## 2017-04-10 LAB — BASIC METABOLIC PANEL
BUN/Creatinine Ratio: 20 (ref 12–28)
BUN: 21 mg/dL (ref 8–27)
CO2: 27 mmol/L (ref 20–29)
Calcium: 9.5 mg/dL (ref 8.7–10.3)
Chloride: 98 mmol/L (ref 96–106)
Creatinine, Ser: 1.04 mg/dL — ABNORMAL HIGH (ref 0.57–1.00)
GFR calc Af Amer: 67 mL/min/{1.73_m2} (ref >59)
GFR calc non Af Amer: 59 mL/min/{1.73_m2} — ABNORMAL LOW (ref >59)
Glucose: 129 mg/dL — ABNORMAL HIGH (ref 65–99)
Potassium: 4.4 mmol/L (ref 3.5–5.2)
Sodium: 139 mmol/L (ref 134–144)

## 2017-04-10 LAB — CBC
Hematocrit: 43.8 % (ref 34.0–46.6)
Hemoglobin: 14.3 g/dL (ref 11.1–15.9)
MCH: 26.3 pg — ABNORMAL LOW (ref 26.6–33.0)
MCHC: 32.6 g/dL (ref 31.5–35.7)
MCV: 81 fL (ref 79–97)
Platelets: 182 10*3/uL (ref 150–379)
RBC: 5.43 x10E6/uL — ABNORMAL HIGH (ref 3.77–5.28)
RDW: 15.8 % — ABNORMAL HIGH (ref 12.3–15.4)
WBC: 5.4 10*3/uL (ref 3.4–10.8)

## 2017-04-10 LAB — PRO B NATRIURETIC PEPTIDE: NT-Pro BNP: 49 pg/mL (ref 0–287)

## 2017-04-10 MED ORDER — POTASSIUM CHLORIDE CRYS ER 20 MEQ PO TBCR: 20.0000 meq | EXTENDED_RELEASE_TABLET | Freq: Two times a day (BID) | ORAL | 6 refills | Status: DC

## 2017-04-10 MED ORDER — FUROSEMIDE 80 MG PO TABS: 40.0000 mg | ORAL_TABLET | Freq: Two times a day (BID) | ORAL | 6 refills | Status: DC

## 2017-04-10 NOTE — Progress Notes (Signed)
CARDIOLOGY OFFICE NOTE  Date:  04/10/2017    Theresa Kirby Date of Birth: 1956-02-20 Medical Record #[id number]  PCP:  Merrilee Seashore, MD  Cardiologist:  [facility]  Chief Complaint  Patient presents with  . Shortness of Breath    Post hospital visit - seen for Dr. Marlou Porch    History of Present Illness: Theresa Kirby is a 61 y.o. female who presents today for a post hospital/TOC visit - however she is outside the timeframe for a TOC visit. Discharged on 03/19/2017. Seen for Dr. Marlou Porch.   She has a hx of morbid obesity, OSA on CPAP (noncompliant), arthritis, hypothyroidism, asthma, iron deficiency anemia, non-traumatic rhabdo of RLE 2017, & A1C 6.9 (without prior dx of DM).  Noted abnormal CT of the chest from 2017.   Presented last month with progressive shortness of breath/CHF - noted to have gained 20 pounds since May. More swelling, +PND and orthopnea. CXR concerning for bilateral pulmonary interstitial edema. She was diuresed 12 liters. Echo with normal EF and grade 2 DD, mildly D shaped interventricular septum suggestive of right ventricular pressure/volume overload.   Called last week with dizziness - Lasix was held along with her potassium. Placed out of work for 7 more days by Dr. Marlou Porch.   Comes in today. Here alone. She notes her dizziness has improved since stopping the Lasix over the past few days. No cramps. Her breathing is good. No chest pain. Weight is already increasing. She says she is trying to do better about salt restriction. Does not seem to be aware of the recommendations made in regards to a CT scan from October of 2017 and does not see PCP on a regular basis - has no plans to see him in follow up at this time. Swelling is returning. No cough. No hemoptysis.   Past Medical History:  Diagnosis Date  . Arthritis    knees  . Asthma   . Iron deficiency anemia   . Morbid obesity ([place])   . Rhabdomyolysis 04/2016   normal compartment pressures  .  Sleep apnea    uses cpap, setting is unknown, does not wear it consistently  . Thyroid disease     Past Surgical History:  Procedure Laterality Date  . CESAREAN SECTION    . COLONOSCOPY WITH PROPOFOL N/A 07/21/2015   Procedure: COLONOSCOPY WITH PROPOFOL;  Surgeon: Juanita Craver, MD;  Location: [facility];  Service: Endoscopy;  Laterality: N/A;  . DILATION AND CURETTAGE OF UTERUS    . ENDOMETRIAL ABLATION    . GANGLION CYST EXCISION     rt arm  . SHOULDER SURGERY Left    Rotator cuff repair     Medications: Current Meds  Medication Sig  . acetaminophen (TYLENOL) 325 MG tablet Take 650 mg by mouth every 6 (six) hours as needed (pain).   Marland Kitchen albuterol (PROAIR HFA) 108 (90 BASE) MCG/ACT inhaler Inhale 2 puffs into the lungs every 6 (six) hours as needed for wheezing or shortness of breath (rescue).   Marland Kitchen aspirin EC 81 MG EC tablet Take 1 tablet (81 mg total) by mouth daily.  . diclofenac sodium (VOLTAREN) 1 % GEL Apply 2 g topically 2 (two) times daily as needed (pain). Applies to knees.  . ferrous sulfate 325 (65 FE) MG tablet Take 325 mg by mouth 3 (three) times daily with meals.  . Fluticasone-Salmeterol (ADVAIR) 250-50 MCG/DOSE AEPB Inhale 1 puff into the lungs 2 (two) times daily.  . montelukast (SINGULAIR) 10 MG  tablet Take 10 mg by mouth at bedtime as needed (allergies.).   Marland Kitchen Multiple Vitamin (MULTIVITAMIN) capsule Take 1 capsule by mouth daily.  . Omega-3 Fatty Acids (FISH OIL PO) Take 1 capsule by mouth daily.     Allergies: Allergies  Allergen Reactions  . Sulfa Antibiotics Hives and Shortness Of Breath    Social History: The patient  reports that she has never smoked. She has never used smokeless tobacco. She reports that she does not drink alcohol or use drugs.   Family History: The patient's family history includes Heart disease in her maternal grandmother and mother; Heart failure in her mother; Hypertension in her brother and maternal grandmother.   Review of  Systems: Please see the history of present illness.   Otherwise, the review of systems is positive for none.   All other systems are reviewed and negative.   Physical Exam: VS:  BP 138/72 (BP Location: Left Arm, Patient Position: Sitting, Cuff Size: Large)   Pulse 79   Ht 5\' 4"  (1.626 m)   Wt (!) 336 lb 12.8 oz (152.8 kg)   LMP 05/12/2012   SpO2 92% Comment: at rest  BMI 57.81 kg/m  .  BMI Body mass index is 57.81 kg/m.  Wt Readings from Last 3 Encounters:  04/10/17 (!) 336 lb 12.8 oz (152.8 kg)  03/19/17 (!) 332 lb 1.6 oz (150.6 kg)  05/30/16 297 lb (134.7 kg)    General: Pleasant. Morbidly obese. [name] and in no acute distress.   HEENT: Normal.  Neck: Supple, no JVD, carotid bruits, or masses noted.  Cardiac: Regular rate and rhythm. No murmurs, rubs, or gallops. No edema.  Respiratory:  Lungs are clear to auscultation bilaterally with normal work of breathing.  GI: Soft and nontender.  MS: No deformity or atrophy. Gait and ROM intact.  Skin: Warm and dry. Color is normal.  Neuro:  Strength and sensation are intact and no gross focal deficits noted.  Psych: Alert, appropriate and with normal affect.   LABORATORY DATA:  EKG:  EKG is not ordered today.  Lab Results  Component Value Date   WBC 5.4 03/16/2017   HGB 14.3 03/16/2017   HCT 50.0 (H) 03/16/2017   PLT 200 03/16/2017   GLUCOSE 131 (H) 03/19/2017   CHOL 151 03/14/2017   TRIG 107 03/14/2017   HDL 40 (L) 03/14/2017   LDLCALC 90 03/14/2017   ALT 19 03/13/2017   AST 32 03/13/2017   NA 138 03/19/2017   K 3.3 (L) 03/19/2017   CL 89 (L) 03/19/2017   CREATININE 1.06 (H) 03/19/2017   BUN 22 (H) 03/19/2017   CO2 38 (H) 03/19/2017   TSH 5.971 (H) 03/13/2017   HGBA1C 6.9 (H) 03/13/2017       BNP (last 3 results)  Recent Labs  03/13/17 1618  BNP 210.2*    ProBNP (last 3 results) No results for input(s): PROBNP in the last 8760 hours.   Other Studies Reviewed Today:  Echo Study Conclusions  02/2017  - Left ventricle: The cavity size was normal. Wall thickness was   normal. Systolic function was normal. The estimated ejection   fraction was in the range of 60% to 65%. Although no diagnostic   regional wall motion abnormality was identified, this possibility   cannot be completely excluded on the basis of this study.   Features are consistent with a pseudonormal left ventricular   filling pattern, with concomitant abnormal relaxation and   increased filling pressure (grade  2 diastolic dysfunction). - Ventricular septum: Mildly D-shaped interventricular septum   suggestive of RV pressure/volume overload. - Aortic valve: There was no stenosis. - Mitral valve: There was no significant regurgitation. - Right ventricle: The cavity size was mildly dilated. Systolic   function was normal. - Right atrium: The atrium was mildly dilated. - Pulmonary arteries: No complete TR doppler jet so unable to   estimate PA systolic pressure. - Systemic veins: IVC measured 2.0 cm with < 50% respirophasic   variation, suggesting RA pressure 8 mmHg.  Impressions:  - Normal LV size with EF 60-65%. Moderate diastolic dysfunction.   Mildly dilated RV with normal systolic function. Mildly D-shaped   interventricular septum suggesting RV pressure/volume overload.    CT CHEST IMPRESSION 04/2016: 1. Image quality is rather degraded by body habitus and respiratory motion. 2. 1.4 cm right lower lobe nodule. Consider one of the following in 3 months for both low-risk and high-risk individuals: (a) repeat chest CT, (b) follow-up PET-CT, or (c) tissue sampling. This recommendation follows the consensus statement: Guidelines for Management of Incidental Pulmonary Nodules Detected on CT Images: From the Fleischner Society 2017; Radiology 2017; 284:228-243. 3.  Aortic atherosclerosis (ICD10-170.0). 4. Enlarged pulmonary arteries, indicative of pulmonary arterial hypertension.   Electronically  Signed   By: Lorin Picket M.D.   On: 05/07/2016 12:03   Assessment/Plan: 1. Recent admission for shortness of breath - diastolic HF - but also with abnormal CT of the chest. Weight is starting to increase. Lab today. Restart Lasix at 40 mg BID and one potassium BID as well. Arrange for chest CT later this week. She is not going to see PCP.   2. Morbid obesity - crux of her issues. Seems to have poor insight overall.    3. HTN - BP is fair. Consider adding ACE on return.   4. OSA - CPAP was ordered during this most recent admission.  5. Hypothyroidism - repeat TSH that I see from the [facility] was normal. Would follow for now.   6. Abnormal chest CT - will get her study repeated - may need referral to pulmonary or TCTS for PET/biopsy.   Current medicines are reviewed with the patient today.  The patient does not have concerns regarding medicines other than what has been noted above.  The following changes have been made:  See above.  Labs/ tests ordered today include:    Orders Placed This Encounter  Procedures  . CT Chest Wo Contrast  . Basic metabolic panel  . CBC  . Pro b natriuretic peptide (BNP)     Disposition:   FU with me/Dr. Marlou Porch in one month.   Patient is agreeable to this plan and will call if any problems develop in the interim.   SignedTruitt Merle, NP  04/10/2017 10:37 AM  [facility] [address] [place], [place]  [address] Phone: [phone] Fax: [phone]

## 2017-04-10 NOTE — Patient Instructions (Addendum)
We will be checking the following labs today - BMET, CBC and BNP   Medication Instructions:    Continue with your current medicines. BUT  I am restarting the Lasix at 1/2 tablet (40 mg) to take twice a day  I am restarting the potassium - but just one pill twice a day  I have refilled these medicines today.     Testing/Procedures To Be Arranged:  CT of the chest (without contrast) later this week - follow up abnormal scan from October of 2017  Follow-Up:   See me or Dr. Marlou Porch in one month.     Other Special Instructions:   Daily weights  Continue to restrict your salt - try for less than 2000 mg a day of sodium    If you need a refill on your cardiac medications before your next appointment, please call your pharmacy.   Call the [place] office at [phone] if you have any questions, problems or concerns.

## 2017-04-14 ENCOUNTER — Ambulatory Visit (INDEPENDENT_AMBULATORY_CARE_PROVIDER_SITE_OTHER)
Admission: RE | Admit: 2017-04-14 | Discharge: 2017-04-14 | Disposition: A | Discharge status: Home / Self Care | Payer: Federal, State, Local not specified - PPO | Source: Ambulatory Visit | Attending: Nurse Practitioner | Admitting: Nurse Practitioner

## 2017-04-14 DIAGNOSIS — R938 Abnormal findings on diagnostic imaging of other specified body structures: Secondary | ICD-10-CM | POA: Diagnosis not present

## 2017-04-14 DIAGNOSIS — R0602 Shortness of breath: Secondary | ICD-10-CM

## 2017-04-14 DIAGNOSIS — R9389 Abnormal findings on diagnostic imaging of other specified body structures: Secondary | ICD-10-CM

## 2017-04-18 ENCOUNTER — Other Ambulatory Visit: Payer: Self-pay | Admitting: *Deleted

## 2017-04-18 DIAGNOSIS — R918 Other nonspecific abnormal finding of lung field: Secondary | ICD-10-CM

## 2017-05-17 ENCOUNTER — Encounter: Payer: Self-pay | Admitting: *Deleted

## 2017-05-17 ENCOUNTER — Encounter: Payer: Self-pay | Admitting: Cardiology

## 2017-05-17 ENCOUNTER — Ambulatory Visit (INDEPENDENT_AMBULATORY_CARE_PROVIDER_SITE_OTHER): Payer: Federal, State, Local not specified - PPO | Admitting: Cardiology

## 2017-05-17 VITALS — BP 108/76 | HR 62 | Resp 16 | Ht 64.0 in | Wt 334.0 lb

## 2017-05-17 DIAGNOSIS — R9389 Abnormal findings on diagnostic imaging of other specified body structures: Secondary | ICD-10-CM | POA: Diagnosis not present

## 2017-05-17 DIAGNOSIS — R0602 Shortness of breath: Secondary | ICD-10-CM

## 2017-05-17 DIAGNOSIS — R911 Solitary pulmonary nodule: Secondary | ICD-10-CM

## 2017-05-17 NOTE — Patient Instructions (Signed)
Medication Instructions:  The current medical regimen is effective;  continue present plan and medications.  You have been referred to pulmonary to further evaluate and follow your pulmonary nodule.  Follow-Up: Follow up in 4 months with Truitt Merle, NP.  Follow up in 8 months with Dr Marlou Porch.  If you need a refill on your cardiac medications before your next appointment, please call your pharmacy.  Thank you for choosing [facility]!!

## 2017-05-17 NOTE — Progress Notes (Signed)
Cardiology Office Note:    Date:  05/17/2017   ID:  Theresa Kirby, DOB 20-Dec-1955, MRN [id number]  PCP:  Merrilee Seashore, MD  Cardiologist:  Candee Furbish, MD    Referring MD: Merrilee Seashore, MD   Chief Complaint  Patient presents with  . Congestive Heart Failure    SOB    History of Present Illness:    Theresa Kirby is a 61 y.o. female with a hx of hospitalization in August 2018 for progressive shortness of breath 20 pound weight gain, morbid obesity sleep apnea here for follow-up.  Prior office visit 1 month ago with Truitt Merle, NP reviewed.  She was diuresed a total of 12 L during hospital.  After going home, she felt as though her dizziness worsened.  This improved after stopping Lasix for a few days.  Overall she has been doing fairly well recently.  No significant changes, no shortness of breath, no chest pain.  She is eager to go back to work.  Past Medical History:  Diagnosis Date  . Arthritis    knees  . Asthma   . Iron deficiency anemia   . Morbid obesity ([place])   . Rhabdomyolysis 04/2016   normal compartment pressures  . Sleep apnea    uses cpap, setting is unknown, does not wear it consistently  . Thyroid disease     Past Surgical History:  Procedure Laterality Date  . CESAREAN SECTION    . COLONOSCOPY WITH PROPOFOL N/A 07/21/2015   Procedure: COLONOSCOPY WITH PROPOFOL;  Surgeon: Juanita Craver, MD;  Location: [facility];  Service: Endoscopy;  Laterality: N/A;  . DILATION AND CURETTAGE OF UTERUS    . ENDOMETRIAL ABLATION    . GANGLION CYST EXCISION     rt arm  . SHOULDER SURGERY Left    Rotator cuff repair    Current Medications: Current Meds  Medication Sig  . acetaminophen (TYLENOL) 325 MG tablet Take 650 mg by mouth every 6 (six) hours as needed (pain).   Marland Kitchen albuterol (PROAIR HFA) 108 (90 BASE) MCG/ACT inhaler Inhale 2 puffs into the lungs every 6 (six) hours as needed for wheezing or shortness of breath (rescue).   Marland Kitchen aspirin EC 81 MG  EC tablet Take 1 tablet (81 mg total) by mouth daily.  . diclofenac sodium (VOLTAREN) 1 % GEL Apply 2 g topically 2 (two) times daily as needed (pain). Applies to knees.  . ferrous sulfate 325 (65 FE) MG tablet Take 325 mg by mouth 3 (three) times daily with meals.  . Fluticasone-Salmeterol (ADVAIR) 250-50 MCG/DOSE AEPB Inhale 1 puff into the lungs 2 (two) times daily.  . furosemide (LASIX) 80 MG tablet Take 0.5 tablets (40 mg total) by mouth 2 (two) times daily.  . montelukast (SINGULAIR) 10 MG tablet Take 10 mg by mouth at bedtime as needed (allergies.).   Marland Kitchen Multiple Vitamin (MULTIVITAMIN) capsule Take 1 capsule by mouth daily.  . Omega-3 Fatty Acids (FISH OIL PO) Take 1 capsule by mouth daily.  . potassium chloride SA (K-DUR,KLOR-CON) 20 MEQ tablet Take 1 tablet (20 mEq total) by mouth 2 (two) times daily.     Allergies:   Sulfa antibiotics   Social History   Social History  . Marital status: Single    Spouse name: N/A  . Number of children: N/A  . Years of education: N/A   Occupational History  . 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 Smoking status: Never  Smoker  . Smokeless tobacco: Never Used  . Alcohol use No  . Drug use: No  . Sexual activity: Not Currently    Birth control/ protection: None   Other Topics Concern  . None   Social History Narrative  . None     Family History: The patient's family history includes Heart disease in her maternal grandmother and mother; Heart failure in her mother; Hypertension in her brother and maternal grandmother. ROS:   Please see the history of present illness.     All other systems reviewed and are negative.  EKGs/Labs/Other Studies Reviewed:    The following studies were reviewed today: Echo Study Conclusions 02/2017  - Left ventricle: The cavity size was normal. Wall thickness was normal. Systolic function was normal. The estimated ejection fraction was in the range of 60% to 65%.  Although no diagnostic regional wall motion abnormality was identified, this possibility cannot be completely excluded on the basis of this study. Features are consistent with a pseudonormal left ventricular filling pattern, with concomitant abnormal relaxation and increased filling pressure (grade 2 diastolic dysfunction). - Ventricular septum: Mildly D-shaped interventricular septum suggestive of RV pressure/volume overload. - Aortic valve: There was no stenosis. - Mitral valve: There was no significant regurgitation. - Right ventricle: The cavity size was mildly dilated. Systolic function was normal. - Right atrium: The atrium was mildly dilated. - Pulmonary arteries: No complete TR doppler jet so unable to estimate PA systolic pressure. - Systemic veins: IVC measured 2.0 cm with < 50% respirophasic variation, suggesting RA pressure 8 mmHg.  Impressions:  - Normal LV size with EF 60-65%. Moderate diastolic dysfunction. Mildly dilated RV with normal systolic function. Mildly D-shaped interventricular septum suggesting RV pressure/volume overload.  CT CHEST IMPRESSION 04/2016: 1. Image quality is rather degraded by body habitus and respiratory motion. 2. 1.4 cm right lower lobe nodule. Consider one of the following in 3 months for both low-risk and high-risk individuals: (a) repeat chest CT, (b) follow-up PET-CT, or (c) tissue sampling. This recommendation follows the consensus statement: Guidelines for Management of Incidental Pulmonary Nodules Detected on CT Images: From the Fleischner Society 2017; Radiology 2017; 284:228-243. 3. Aortic atherosclerosis (ICD10-170.0). 4. Enlarged pulmonary arteries, indicative of pulmonary arterial hypertension.   Electronically Signed By: Lorin Picket M.D. On: 05/07/2016 12:03   EKG:  none  Recent Labs: 03/13/2017: ALT 19; B Natriuretic Peptide 210.2; TSH 5.971 03/15/2017: Magnesium 2.3 04/10/2017:  BUN 21; Creatinine, Ser 1.04; Hemoglobin 14.3; NT-Pro BNP 49; Platelets 182; Potassium 4.4; Sodium 139  Recent Lipid Panel    Component Value Date/Time   CHOL 151 03/14/2017 0427   TRIG 107 03/14/2017 0427   HDL 40 (L) 03/14/2017 0427   CHOLHDL 3.8 03/14/2017 0427   VLDL 21 03/14/2017 0427   LDLCALC 90 03/14/2017 0427    Physical Exam:    VS:  BP 108/76   Pulse 62   Resp 16   Ht 5\' 4"  (1.626 m)   Wt (!) 334 lb (151.5 kg)   LMP 05/12/2012   SpO2 94%   BMI 57.33 kg/m     Wt Readings from Last 3 Encounters:  05/17/17 (!) 334 lb (151.5 kg)  04/10/17 (!) 336 lb 12.8 oz (152.8 kg)  03/19/17 (!) 332 lb 1.6 oz (150.6 kg)     GEN:  Well nourished, well developed in no acute distress, obese HEENT: Normal NECK: No JVD; No carotid bruits LYMPHATICS: No lymphadenopathy CARDIAC: RRR, no murmurs, rubs, gallops RESPIRATORY:  Clear to auscultation without  rales, wheezing or rhonchi  ABDOMEN: Soft, non-tender, non-distended MUSCULOSKELETAL:  No edema; No deformity  SKIN: Warm and dry NEUROLOGIC:  Alert and oriented x 3 PSYCHIATRIC:  Normal affect   ASSESSMENT:    1. Pulmonary nodule   2. Shortness of breath   3. Abnormal chest CT   4. Morbid obesity ([place])    PLAN:    In order of problems listed above:  Admission for shortness of breath-in part diastolic heart failure-obesity morbid.  Agree that weight loss will be extremely beneficial for her.  She is working on this.  She understands to decrease caloric intake.  She is trying to eliminate canned goods, salt.  We discussed daily weights.  Okay to get back to work. We will refer to pulmonary for pulmonary nodule.  Obstructive sleep apnea CPAP has new machine-she is wearing  Medication Adjustments/Labs and Tests Ordered: Current medicines are reviewed at length with the patient today.  Concerns regarding medicines are outlined above.  Orders Placed This Encounter  Procedures  . Ambulatory referral to Pulmonology   No  orders of the defined types were placed in this encounter.   Signed, Candee Furbish, MD  05/17/2017 4:58 PM    [place]

## 2017-05-26 ENCOUNTER — Ambulatory Visit (INDEPENDENT_AMBULATORY_CARE_PROVIDER_SITE_OTHER): Payer: Federal, State, Local not specified - PPO | Admitting: Podiatry

## 2017-05-26 ENCOUNTER — Encounter: Payer: Self-pay | Admitting: Podiatry

## 2017-05-26 ENCOUNTER — Ambulatory Visit (INDEPENDENT_AMBULATORY_CARE_PROVIDER_SITE_OTHER): Payer: Federal, State, Local not specified - PPO

## 2017-05-26 VITALS — BP 155/91 | HR 80 | Resp 16 | Ht 64.0 in | Wt 330.0 lb

## 2017-05-26 DIAGNOSIS — M79671 Pain in right foot: Secondary | ICD-10-CM | POA: Diagnosis not present

## 2017-05-26 DIAGNOSIS — M792 Neuralgia and neuritis, unspecified: Secondary | ICD-10-CM | POA: Diagnosis not present

## 2017-05-26 DIAGNOSIS — G5751 Tarsal tunnel syndrome, right lower limb: Secondary | ICD-10-CM | POA: Diagnosis not present

## 2017-05-26 DIAGNOSIS — S99921A Unspecified injury of right foot, initial encounter: Secondary | ICD-10-CM

## 2017-05-26 NOTE — Progress Notes (Signed)
Subjective:    Patient ID: Theresa Kirby, female    DOB: 07-30-1955, 61 y.o.   MRN: [id number]  Chief Complaint  Patient presents with  . 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 female presents with the above complaint.  Reports right foot pain.  States the pain is the entire bottom surface of her foot.  States that it shoots from her big toe the inside of her leg.  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  Past Medical History:  Diagnosis Date  . Arthritis    knees  . Asthma   . Iron deficiency anemia   . Morbid obesity ([place])   . Rhabdomyolysis 04/2016   normal compartment pressures  . Sleep apnea    uses cpap, setting is unknown, does not wear it consistently  . Thyroid disease    Past Surgical History:  Procedure Laterality Date  . CESAREAN SECTION    . DILATION AND CURETTAGE OF UTERUS    . ENDOMETRIAL ABLATION    . GANGLION CYST EXCISION     rt arm  . SHOULDER SURGERY Left    Rotator cuff repair    Current Outpatient Medications:  .  acetaminophen (TYLENOL) 325 MG tablet, Take 650 mg by mouth every 6 (six) hours as needed (pain). , Disp: , Rfl:  .  albuterol (PROAIR HFA) 108 (90 BASE) MCG/ACT inhaler, Inhale 2 puffs into the lungs every 6 (six) hours as needed for wheezing or shortness of breath (rescue). , Disp: , Rfl:  .  aspirin EC 81 MG EC tablet, Take 1 tablet (81 mg total) by mouth daily., Disp: , Rfl:  .  diclofenac sodium (VOLTAREN) 1 % GEL, Apply 2 g topically 2 (two) times daily as needed (pain). Applies to knees., Disp: , Rfl:  .  ferrous sulfate 325 (65 FE) MG tablet, Take 325 mg by mouth 3 (three) times daily with meals., Disp: , Rfl:  .  Fluticasone-Salmeterol (ADVAIR) 250-50 MCG/DOSE AEPB, Inhale 1 puff into the lungs 2 (two) times daily., Disp: , Rfl:  .  furosemide (LASIX) 80 MG tablet, Take 0.5 tablets (40 mg  total) by mouth 2 (two) times daily., Disp: 60 tablet, Rfl: 6 .  montelukast (SINGULAIR) 10 MG tablet, Take 10 mg by mouth at bedtime as needed (allergies.). , Disp: , Rfl:  .  Multiple Vitamin (MULTIVITAMIN) capsule, Take 1 capsule by mouth daily., Disp: , Rfl:  .  Omega-3 Fatty Acids (FISH OIL PO), Take 1 capsule by mouth daily., Disp: , Rfl:  .  potassium chloride SA (K-DUR,KLOR-CON) 20 MEQ tablet, Take 1 tablet (20 mEq total) by mouth 2 (two) times daily., Disp: 60 tablet, Rfl: 6  Allergies  Allergen Reactions  . Sulfa Antibiotics Hives and Shortness Of Breath    Review of Systems  All other systems reviewed and are negative.      Objective:   Physical Exam Vitals:   05/26/17 1442  BP: (!) 155/91  Pulse: 80  Resp: 16   General AA&O x3. Normal mood and affect.  Vascular Dorsalis pedis and posterior tibial pulses  present 2+ bilaterally  Capillary refill normal to all digits. Pedal hair growth normal.  Neurologic Epicritic sensation grossly present. Positive Tinel's right tarsal tunnel  Dermatologic No open lesions. Interspaces clear  of maceration. Nails well groomed and normal in appearance.  Orthopedic: MMT 5/5 in dorsiflexion, plantarflexion, inversion, and eversion. Normal joint ROM without pain or crepitus. Pain to palpation right medial ankle posterior to the medial malleolus     Assessment & Plan:  Patient was evaluated and treated and all questions answered  Tarsal tunnel syndrome, right -Educated on etiology -Injection delivered to the right tarsal tunnel -Tri-Lock ankle brace dispensed.  Medically necessary for immobilization and prevent stress on the tarsal tunnel. -Patient ok to return to work. Recommended light duty at first.  Procedure: Peripheral nerve injection Location: Right tarsal tunnel Skin Prep: Alcohol. Injectate: 0.5 cc 0.5% marcaine plain, 0.5 cc dexamethasone phosphate. Disposition: Patient tolerated procedure well. Injection site dressed  with a band-aid.

## 2017-05-28 ENCOUNTER — Encounter: Payer: Self-pay | Admitting: Podiatry

## 2017-05-30 ENCOUNTER — Other Ambulatory Visit: Payer: Self-pay | Admitting: Podiatry

## 2017-05-30 DIAGNOSIS — G5751 Tarsal tunnel syndrome, right lower limb: Secondary | ICD-10-CM

## 2017-06-08 ENCOUNTER — Ambulatory Visit: Payer: Federal, State, Local not specified - PPO | Admitting: Pulmonary Disease

## 2017-06-08 ENCOUNTER — Encounter: Payer: Self-pay | Admitting: Pulmonary Disease

## 2017-06-08 ENCOUNTER — Other Ambulatory Visit: Payer: Federal, State, Local not specified - PPO

## 2017-06-08 DIAGNOSIS — R590 Localized enlarged lymph nodes: Secondary | ICD-10-CM | POA: Diagnosis not present

## 2017-06-08 DIAGNOSIS — R911 Solitary pulmonary nodule: Secondary | ICD-10-CM

## 2017-06-08 DIAGNOSIS — G4733 Obstructive sleep apnea (adult) (pediatric): Secondary | ICD-10-CM | POA: Diagnosis not present

## 2017-06-08 NOTE — Assessment & Plan Note (Signed)
a nodule in the right lower lung and enlarged lymph glands in the chest. Possibilities include condition called sarcoidosis, old infection and very unlikely to be cancer  We discussed options. Suggest repeat CT chest with contrast in April and follow-up

## 2017-06-08 NOTE — Progress Notes (Signed)
Subjective:    Patient ID: Theresa Kirby, female    DOB: 1955/10/06, 61 y.o.   MRN: [id number]  HPI  61 year old obese woman referred for evaluation of pulmonary nodule, incidentally noted on a CT scan done in October 2017. 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  She sustained a foot injury in October 2017 and was admitted to [facility] for a low oxygen level.  She was subsequently diagnosed with diastolic heart failure. CT angiogram 04/2016 showed 1.3 cm nodule in the right lower lobe but was not very well characterized due to motion artifact.  There was also enlarged mediastinal and peribronchial lymph nodes measuring up to 1.7 cm. She continue to follow-up with cardiology and a follow-up CT chest with contrast was performed 03/2017 which showed stable nodule and enlarged right paratracheal and left hilar lymph nodes as prior.  She denies skin changes or visual problems or redness of her eyes. She has a pituitary microadenoma diagnosed in 2011. She has adult onset asthma from her 84s and has been well controlled on Advair for many years, triggers being allergies to dust and trees.  She is a lifetime never smoker. She has diabetes controlled with oral medications. She reports dyspnea on exertion but denies cough.  Review of her imaging studies dating back to chest x-rays from 2010 and 2013 showed left hilar fullness but has been attributed to vascular congestion and prominent pulmonary arteries  She was diagnosed with obstructive sleep apnea more than 10 years ago and is maintained on CPAP with full facemask, obtain a new machine in 2018 which she really likes and has been more compliant with.  She denies daytime somnolence and fatigue  Past Medical History:  Diagnosis Date  . Arthritis    knees  . Asthma   . Iron deficiency anemia   . Morbid obesity ([place])   . Rhabdomyolysis 04/2016   normal compartment pressures  . Sleep apnea    uses cpap, setting is unknown, does not wear it  consistently  . Thyroid disease     Past Surgical History:  Procedure Laterality Date  . CESAREAN SECTION    . COLONOSCOPY WITH PROPOFOL N/A 07/21/2015   Procedure: COLONOSCOPY WITH PROPOFOL;  Surgeon: Juanita Craver, MD;  Location: [facility];  Service: Endoscopy;  Laterality: N/A;  . DILATION AND CURETTAGE OF UTERUS    . ENDOMETRIAL ABLATION    . GANGLION CYST EXCISION     rt arm  . SHOULDER SURGERY Left    Rotator cuff repair    Allergies  Allergen Reactions  . Sulfa Antibiotics Hives and Shortness Of Breath    Social History   Socioeconomic History  . Marital status: Single    Spouse name: Not on file  . Number of children: Not on file  . Years of education: Not on file  . Highest education level: Not on file  Social Needs  . Financial resource strain: Not on file  . Food insecurity - worry: Not on file  . Food insecurity - inability: Not on file  . Transportation needs - medical: Not on file  . Transportation needs - non-medical: Not on file  Occupational History  . 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 Smoking status: Never Smoker  . Smokeless tobacco: Never Used  Substance and Sexual Activity  . Alcohol use: No  . Drug use: No  . Sexual activity: Not Currently    Birth control/protection: None  Other Topics  Concern  . Not on file  Social History Narrative  . Not on file       Family History  Problem Relation Age of Onset  . Heart disease Mother   . Heart failure Mother        Around 34  . Hypertension Brother   . Hypertension Maternal Grandmother   . Heart disease Maternal Grandmother      Review of Systems Positive for shortness of breath on exertion and lower extremity edema  Constitutional: negative for anorexia, fevers and sweats  Eyes: negative for irritation, redness and visual disturbance  Ears, nose, mouth, throat, and face: negative for earaches, epistaxis, nasal congestion and sore throat    Respiratory: negative for cough,  sputum and wheezing  Cardiovascular: negative for chest pain, orthopnea, palpitations and syncope  Gastrointestinal: negative for abdominal pain, constipation, diarrhea, melena, nausea and vomiting  Genitourinary:negative for dysuria, frequency and hematuria  Hematologic/lymphatic: negative for bleeding, easy bruising and lymphadenopathy  Musculoskeletal:negative for arthralgias, muscle weakness and stiff joints  Neurological: negative for coordination problems, gait problems, headaches and weakness  Endocrine: negative for diabetic symptoms including polydipsia, polyuria and weight loss     Objective:   Physical Exam  Gen. Pleasant, obese, in no distress, normal affect ENT - no lesions, no post nasal drip, class 2-3 airway Neck: No JVD, no thyromegaly, no carotid bruits Lungs: no use of accessory muscles, no dullness to percussion, decreased without rales or rhonchi  Cardiovascular: Rhythm regular, heart sounds  normal, no murmurs or gallops, 1+ peripheral edema Abdomen: soft and non-tender, no hepatosplenomegaly, BS normal. Musculoskeletal: No deformities, no cyanosis or clubbing Neuro:  alert, non focal, no tremors       Assessment & Plan:

## 2017-06-08 NOTE — Assessment & Plan Note (Signed)
Check ACE level today  

## 2017-06-08 NOTE — Addendum Note (Signed)
Addended by: Valerie Salts on: 06/08/2017 04:02 PM   Modules accepted: Orders

## 2017-06-08 NOTE — Addendum Note (Signed)
Addended by: Valerie Salts on: 06/08/2017 04:00 PM   Modules accepted: Orders

## 2017-06-08 NOTE — Assessment & Plan Note (Signed)
Weight loss encouraged, compliance with goal of at least 4-6 hrs every night is the expectation. Advised against medications with sedative side effects Cautioned against driving when sleepy - understanding that sleepiness will vary on a day to day basis  

## 2017-06-08 NOTE — Patient Instructions (Signed)
You have a nodule in the right lower lung and enlarged lymph glands in the chest. Possibilities include condition called sarcoidosis, old infection and very unlikely to be cancer  We discussed options. Suggest repeat CT chest with contrast in April and follow-up

## 2017-06-09 LAB — ANGIOTENSIN CONVERTING ENZYME: Angiotensin-Converting Enzyme: 19 U/L (ref 9–67)

## 2017-06-16 ENCOUNTER — Telehealth: Payer: Self-pay | Admitting: Pulmonary Disease

## 2017-06-16 NOTE — Telephone Encounter (Signed)
Notes recorded by Rigoberto Noel, MD on 06/09/2017 at 1:22 PM EST ACE level normal  Advised pt of results. Pt understood and nothing further is needed.

## 2017-06-16 NOTE — Telephone Encounter (Signed)
Patient returning call.  CB is [phone]. (Encounter was already open but without any information).

## 2017-06-23 ENCOUNTER — Ambulatory Visit: Payer: Federal, State, Local not specified - PPO | Admitting: Podiatry

## 2017-06-23 DIAGNOSIS — G5751 Tarsal tunnel syndrome, right lower limb: Secondary | ICD-10-CM | POA: Diagnosis not present

## 2017-06-23 DIAGNOSIS — M792 Neuralgia and neuritis, unspecified: Secondary | ICD-10-CM

## 2017-06-23 NOTE — Progress Notes (Signed)
  Subjective:  Patient ID: Theresa Kirby, female    DOB: 1955/08/08,  MRN: [id number]  61 y.o. female returns for tarsal tunnel syndrome right ankle.  Much improved.  States that the injection she had a greatly helped.  Has been wearing her Tri-Lock ankle brace  Objective:  There were no vitals filed for this visit. General AA&O x3. Normal mood and affect.  Vascular Pedal pulses palpable.  Neurologic Epicritic sensation grossly intact.  Tinel's right ankle  Dermatologic No open lesions. Skin normal texture and turgor.  Orthopedic:  To palpation right ankle at the tarsal tunnel   Assessment & Plan:  Patient was evaluated and treated and all questions answered.  Tunnel syndrome right -Repeat injection as below -Continue Tri-Lock ankle brace -Would consider nerve conduction studies if pain still persistent next visit  Procedure: Tarsal Tunnel Injection Location: Right tarsal tunnel Skin Prep: Alcohol. Injectate: 0.50 cc 0.5% marcaine plain, 1.0 cc dexamethasone phosphate. Disposition: Patient tolerated procedure well. Injection site dressed with a band-aid.  Return in about 4 weeks (around 07/21/2017) for tarsal unnel f/u.

## 2017-07-21 ENCOUNTER — Encounter: Payer: Self-pay | Admitting: Neurology

## 2017-07-21 ENCOUNTER — Ambulatory Visit: Payer: Federal, State, Local not specified - PPO | Admitting: Podiatry

## 2017-07-21 ENCOUNTER — Telehealth: Payer: Self-pay | Admitting: *Deleted

## 2017-07-21 ENCOUNTER — Encounter: Payer: Self-pay | Admitting: Podiatry

## 2017-07-21 DIAGNOSIS — M792 Neuralgia and neuritis, unspecified: Secondary | ICD-10-CM

## 2017-07-21 DIAGNOSIS — G5751 Tarsal tunnel syndrome, right lower limb: Secondary | ICD-10-CM | POA: Diagnosis not present

## 2017-07-21 NOTE — Telephone Encounter (Signed)
Pt is scheduled for 08/03/2017 with Dr. Posey Pronto at 10:15am.

## 2017-07-22 NOTE — Progress Notes (Signed)
  Subjective:  Patient ID: Theresa Kirby, female    DOB: [birth year],  MRN: [id number]  Chief Complaint  Patient presents with  . Foot Pain    Follow up tarsal tunnel right   "Its doing a little bit bwtter, still wearing the brace"   61 y.o. female returns for the above complaint.  States that he is doing a little better.  Still wearing the brace which she says greatly helped.  States that the injection did help.  Still unsure that she is able to return to work at her current state.  Objective:  There were no vitals filed for this visit. General AA&O x3. Normal mood and affect.  Vascular Pedal pulses palpable.  Neurologic Epicritic sensation grossly intact.  Dermatologic No open lesions. Skin normal texture and turgor.  Orthopedic: No pain to palpation either foot.   Assessment & Plan:  Patient was evaluated and treated and all questions answered.  Tarsal tunnel syndrome right foot. -Continue Tri-Lock ankle brace -Discussed with patient the benefit of ordering nerve conduction studies to evaluate the healing potential of the tarsal tunnel syndrome. Test ordered will be reviewed with patient at next visit -Paperwork to be filled out to allow patient to stay out of work.  No Follow-up on file.

## 2017-08-03 ENCOUNTER — Ambulatory Visit (INDEPENDENT_AMBULATORY_CARE_PROVIDER_SITE_OTHER): Payer: Federal, State, Local not specified - PPO | Admitting: Neurology

## 2017-08-03 DIAGNOSIS — M792 Neuralgia and neuritis, unspecified: Secondary | ICD-10-CM | POA: Diagnosis not present

## 2017-08-03 DIAGNOSIS — G5751 Tarsal tunnel syndrome, right lower limb: Secondary | ICD-10-CM | POA: Diagnosis not present

## 2017-08-03 NOTE — Procedures (Signed)
[facility]  [address]  [place], [place] [address] Tel: [phone] Fax:  [phone] Test Date:  08/03/2017  Patient: Theresa Kirby DOB: 08-Aug-1955 Physician: Narda Amber, DO  Sex: Female Height: 5\' 4"  Ref Phys: Hardie Pulley, DPM  ID#: [id number] Temp: 35.2C Technician:    Patient Complaints: This is a 62 year old female referred for evaluation of right tarsal tunnel syndrome and a fasting with foot pain.  NCV & EMG Findings: Extensive electrodiagnostic testing of the right lower extremity and additional studies of the left shows:  1. Right sural, medial and lateral plantar sensory response are absent. Right superficial peroneal sensory responses within normal limits. On the left, the sural sensory response is present and superficial peroneal sensory response is absent. 2. Right peroneal motor response at the extensor digitorum brevis shows reduced amplitude and is normal at the tibialis anterior. Left peroneal motor responses within normal limits. Bilateral tibial responses are absent. 3. Right tibial H reflex study is within normal limits. 4. Chronic motor axon loss changes are seen affecting the L5 myotomes and abductor hallucis on the right, without accompanied active denervation.  Impression: 1. 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  Overall, these findings are symmetric and moderate in degree electrically. 2. There is evidence of a superimposed chronic L5 radiculopathy affecting the right lower extremity. 3. This study is unable to evaluate for a tarsal tunnel syndrome in the presence of a polyneuropathy. Correlate clinically.    ___________________________ Narda Amber, DO    Nerve Conduction Studies Anti Sensory Summary Table   Site NR Peak (ms) Norm Peak (ms) P-T Amp (V) Norm P-T Amp  Left Sup Peroneal Anti Sensory (Ant Lat Mall)  12  cm NR  <4.6  >3  Right Sup Peroneal Anti Sensory (Ant Lat Mall)  12 cm    4.2 <4.6 4.2 >3  Left Sural Anti Sensory (Lat Mall)  Calf    3.0 <4.6 3.2 >3  Right Sural Anti Sensory (Lat Mall)  Calf NR  <4.6  >3   Motor Summary Table   Site NR Onset (ms) Norm Onset (ms) O-P Amp (mV) Norm O-P Amp Site1 Site2 Delta-0 (ms) Dist (cm) Vel (m/s) Norm Vel (m/s)  Left Peroneal Motor (Ext Dig Brev)  Ankle    3.1 <6.0 5.8 >2.5 B Fib Ankle 7.2 34.0 47 >40  B Fib    10.3  5.6  Poplt B Fib 1.4 8.0 57 >40  Poplt    11.7  5.2         Right Peroneal Motor (Ext Dig Brev)  Ankle NR  <6.0  >2.5 B Fib Ankle  0.0  >40  B Fib NR     Poplt B Fib  0.0  >40  Poplt NR            Right Peroneal TA Motor (Tib Ant)  Fib Head    3.0 <4.5 6.8 >3 Poplit Fib Head 1.0 8.0 80 >40  Poplit    4.0  6.8         Left Tibial Motor (Abd Hall Brev)  Ankle NR  <6.0  >4 Knee Ankle  0.0  >40  Knee NR            Right Tibial Motor (Abd Hall Brev)  Ankle NR  <6.0  >4 Knee Ankle  0.0  >40  Knee NR  Mixed Summary Table   Site NR Peak (ms) Norm Peak (ms) P-T Amp (V) Norm P-T Amp  Right Lateral Plantar Mixed (Med Malleolus)  Lateral Foot NR  <3.7  >8  Right Medial Plantar Mixed (Med Malleolus)  Medial Foot NR  <3.7  >8   H Reflex Studies   NR H-Lat (ms) Lat Norm (ms) L-R H-Lat (ms)  Right Tibial (Gastroc)     31.56 <35    EMG   Side Muscle Ins Act Fibs Psw Fasc Number Recrt Dur Dur. Amp Amp. Poly Poly. Comment  Right AntTibialis Nml Nml Nml Nml 1- Rapid Some 1+ Some 1+ Some 1+ N/A  Right GluteusMed Nml Nml Nml Nml 1- Rapid Some 1+ Some 1+ Some 1+ N/A  Right Flex Dig Long Nml Nml Nml Nml 2- Rapid Some 1+ Some 1+ Some 1+ N/A  Right AbdHallucis Nml Nml Nml Nml 2- Rapid Some 1+ Some 1+ Nml Nml N/A  Right RectFemoris Nml Nml Nml Nml Nml Nml Nml Nml Nml Nml Nml Nml N/A  Right Gastroc Nml Nml Nml Nml Nml Nml Nml Nml Nml Nml Nml Nml N/A  Right BicepsFemS Nml Nml Nml Nml Nml Nml Nml Nml Nml Nml Nml Nml N/A       Waveforms:

## 2017-08-18 ENCOUNTER — Ambulatory Visit: Payer: Federal, State, Local not specified - PPO | Admitting: Podiatry

## 2017-08-18 DIAGNOSIS — M5416 Radiculopathy, lumbar region: Secondary | ICD-10-CM

## 2017-08-18 DIAGNOSIS — M792 Neuralgia and neuritis, unspecified: Secondary | ICD-10-CM

## 2017-08-18 DIAGNOSIS — G629 Polyneuropathy, unspecified: Secondary | ICD-10-CM | POA: Diagnosis not present

## 2017-08-18 DIAGNOSIS — G5751 Tarsal tunnel syndrome, right lower limb: Secondary | ICD-10-CM | POA: Diagnosis not present

## 2017-08-22 NOTE — Progress Notes (Signed)
  Subjective:  Patient ID: Theresa Kirby, female    DOB: 10/01/1955,  MRN: [id number]  Chief Complaint  Patient presents with  . Foot Pain    right ankle pain - better - follow up after nerve conduction test   62 y.o. female returns for the above complaint.  Here for follow-up of EMGs and nerve conduction studies that were performed on 08/03/2017  Objective:  There were no vitals filed for this visit. General AA&O x3. Normal mood and affect.  Vascular Pedal pulses palpable.  Neurologic Epicritic sensation grossly intact.  Dermatologic No open lesions. Skin normal texture and turgor.  Orthopedic: No pain to palpation either foot.   Assessment & Plan:  Patient was evaluated and treated and all questions answered.  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  Superimposed chronic L5 radiculopathy affecting the right lower extremity. Unable to assess for a tarsal tunnel syndrome due to the presence upon neuropathy -Discussed with patient that due to proximal symptoms including polyneuropathy treatment at the pedal level is unlikely to affect patient's outcome.  Patient recommends to follow-up for her spine for possible higher level treatment.  Discussed that should any lower extreme issues arise she can come in for further eval -Follow-up in 3 months to assess progress.  20 minutes of face to face time were spent with the patient. >50% of this was spent on counseling and coordination of care. Specifically discussed with patient the above diagnoses and treatment plans..   Return in about 3 months (around 11/16/2017) for Neuritis f/u.

## 2017-09-19 ENCOUNTER — Ambulatory Visit: Payer: Federal, State, Local not specified - PPO | Admitting: Nurse Practitioner

## 2017-10-06 ENCOUNTER — Telehealth: Payer: Self-pay | Admitting: Podiatry

## 2017-10-06 ENCOUNTER — Other Ambulatory Visit: Payer: Self-pay

## 2017-10-06 DIAGNOSIS — R0602 Shortness of breath: Secondary | ICD-10-CM

## 2017-10-06 NOTE — Telephone Encounter (Signed)
Called pt and left a voicemail letting her know that the office visit notes she requested are ready to be picked up at her convenience. I told her they are at the front desk where check in and check out is. I told her we close at 4:00 pm today but will reopen Monday at 8:00 am. Told her to call us with any other questions or concerns to [phone].

## 2017-10-16 ENCOUNTER — Ambulatory Visit: Payer: Medicaid Other | Admitting: Nurse Practitioner

## 2017-10-16 ENCOUNTER — Encounter: Payer: Self-pay | Admitting: Nurse Practitioner

## 2017-10-16 VITALS — BP 118/64 | HR 55 | Ht 64.0 in | Wt 327.0 lb

## 2017-10-16 DIAGNOSIS — R911 Solitary pulmonary nodule: Secondary | ICD-10-CM

## 2017-10-16 DIAGNOSIS — I5032 Chronic diastolic (congestive) heart failure: Secondary | ICD-10-CM | POA: Diagnosis not present

## 2017-10-16 DIAGNOSIS — R0602 Shortness of breath: Secondary | ICD-10-CM | POA: Diagnosis not present

## 2017-10-16 NOTE — Progress Notes (Signed)
CARDIOLOGY OFFICE NOTE  Date:  10/16/2017    [name] Date of Birth: [birth year] Medical Record #[id number]  PCP:  Merrilee Seashore, MD  Cardiologist:  Marisa Cyphers Chief Complaint  Patient presents with  . Congestive Heart Failure    Follow up visit - seen for Dr. Marlou Porch    History of Present Illness: Theresa Kirby is a 63 y.o. female who presents today for a 5 month check. Seen for Dr. Marlou Porch.   She has a hx of morbid obesity, OSA on CPAP (noncompliant), arthritis, hypothyroidism, asthma, iron deficiency anemia, non-traumatic rhabdo of RLE 2017, & A1C 6.9 (without prior dx of DM). She has had an abnormal chest CT since 2017.   Hospitalized in the fall of last year with progressive shortness of breath/CHF/weight gain - CXR concerning for bilateral pulmonary interstitial edema. She was diuresed 12 liters. Echo with normal EF and grade 2 DD, mildly D shaped interventricular septum suggestive of right ventricular pressure/volume overload. I then saw her for her post hospital visit - she was doing ok but weight was going up and we adjusted her diuretics. Last seen by Dr. Marlou Porch in October and felt to be doing ok.   Comes in today. Here alone. Doing ok. Breathing is "ok". No chest pain. She tries to be as active as she can. She hurt her right foot at work - this has limited her activity. It's been a slow workman's comp case - she admits she is frustrated - she had intended to go back to work but still with lots of pain/difficulty walking. Fortunately, no chest pain. No cough. Seeing pulmonary as well and has plans for repeat scan next month with them. She is tolerating her medicines. Had labs earlier this month with PCP.   Past Medical History:  Diagnosis Date  . Arthritis    knees  . Asthma   . Iron deficiency anemia   . Morbid obesity ([place])   . Rhabdomyolysis 04/2016   normal compartment pressures  . Sleep apnea    uses cpap, setting is unknown, does not wear  it consistently  . Thyroid disease     Past Surgical History:  Procedure Laterality Date  . CESAREAN SECTION    . COLONOSCOPY WITH PROPOFOL N/A 07/21/2015   Procedure: COLONOSCOPY WITH PROPOFOL;  Surgeon: Juanita Craver, MD;  Location: [facility];  Service: Endoscopy;  Laterality: N/A;  . DILATION AND CURETTAGE OF UTERUS    . ENDOMETRIAL ABLATION    . GANGLION CYST EXCISION     rt arm  . SHOULDER SURGERY Left    Rotator cuff repair     Medications: Current Meds  Medication Sig  . acetaminophen (TYLENOL) 325 MG tablet Take 650 mg by mouth every 6 (six) hours as needed (pain).   Marland Kitchen albuterol (PROAIR HFA) 108 (90 BASE) MCG/ACT inhaler Inhale 2 puffs into the lungs every 6 (six) hours as needed for wheezing or shortness of breath (rescue).   Marland Kitchen aspirin EC 81 MG EC tablet Take 1 tablet (81 mg total) by mouth daily.  . diclofenac sodium (VOLTAREN) 1 % GEL Apply 2 g topically 2 (two) times daily as needed (pain). Applies to knees.  . ferrous sulfate 325 (65 FE) MG tablet Take 325 mg by mouth 3 (three) times daily with meals.  . Fluticasone-Salmeterol (ADVAIR) 250-50 MCG/DOSE AEPB Inhale 1 puff into the lungs 2 (two) times daily.  . furosemide (LASIX) 80 MG tablet Take 0.5 tablets (40 mg total)  by mouth 2 (two) times daily.  . montelukast (SINGULAIR) 10 MG tablet Take 10 mg by mouth at bedtime as needed (allergies.).   Marland Kitchen Multiple Vitamin (MULTIVITAMIN) capsule Take 1 capsule by mouth daily.  . Omega-3 Fatty Acids (FISH OIL PO) Take 1 capsule by mouth daily.  . potassium chloride SA (K-DUR,KLOR-CON) 20 MEQ tablet Take 1 tablet (20 mEq total) by mouth 2 (two) times daily.     Allergies: Allergies  Allergen Reactions  . Sulfa Antibiotics Hives and Shortness Of Breath  . Farxiga [Dapagliflozin]     Blisters    Social History: The patient  reports that she has never smoked. She has never used smokeless tobacco. She reports that she does not drink alcohol or use drugs.   Family  History: The patient's family history includes Heart disease in her maternal grandmother and mother; Heart failure in her mother; Hypertension in her brother and maternal grandmother.   Review of Systems: Please see the history of present illness.   Otherwise, the review of systems is positive for none.   All other systems are reviewed and negative.   Physical Exam: VS:  BP 118/64 (BP Location: Right Arm, Patient Position: Sitting)   Pulse (!) 55   Ht 5\' 4"  (1.626 m)   Wt (!) 327 lb (148.3 kg)   LMP 05/12/2012   SpO2 94%   BMI 56.13 kg/m  .  BMI Body mass index is 56.13 kg/m.  Wt Readings from Last 3 Encounters:  10/16/17 (!) 327 lb (148.3 kg)  06/08/17 (!) 330 lb (149.7 kg)  05/26/17 (!) 330 lb (149.7 kg)    General: Pleasant. Morbidly obese. Alert and in no acute distress.   HEENT: Normal.  Neck: Supple, no JVD, carotid bruits, or masses noted.  Cardiac: Regular rate and rhythm. No murmurs, rubs, or gallops. Legs are full - no significant edema - maybe trace on the right foot. Respiratory:  Lungs are clear to auscultation bilaterally with normal work of breathing.  GI: Soft and nontender.  MS: No deformity or atrophy. Gait and ROM intact. She is using a cane.  Skin: Warm and dry. Color is normal.  Neuro:  Strength and sensation are intact and no gross focal deficits noted.  Psych: Alert, appropriate and with normal affect.   LABORATORY DATA:  EKG:  EKG is not ordered today.  Lab Results  Component Value Date   WBC 5.4 04/10/2017   HGB 14.3 04/10/2017   HCT 43.8 04/10/2017   PLT 182 04/10/2017   GLUCOSE 129 (H) 04/10/2017   CHOL 151 03/14/2017   TRIG 107 03/14/2017   HDL 40 (L) 03/14/2017   LDLCALC 90 03/14/2017   ALT 19 03/13/2017   AST 32 03/13/2017   NA 139 04/10/2017   K 4.4 04/10/2017   CL 98 04/10/2017   CREATININE 1.04 (H) 04/10/2017   BUN 21 04/10/2017   CO2 27 04/10/2017   TSH 5.971 (H) 03/13/2017   HGBA1C 6.9 (H) 03/13/2017       BNP (last 3  results) Recent Labs    03/13/17 1618  BNP 210.2*    ProBNP (last 3 results) Recent Labs    04/10/17 1045  PROBNP 49     Other Studies Reviewed Today:  Echo Study Conclusions 02/2017  - Left ventricle: The cavity size was normal. Wall thickness was normal. Systolic function was normal. The estimated ejection fraction was in the range of 60% to 65%. Although no diagnostic regional wall motion abnormality was identified,  this possibility cannot be completely excluded on the basis of this study. Features are consistent with a pseudonormal left ventricular filling pattern, with concomitant abnormal relaxation and increased filling pressure (grade 2 diastolic dysfunction). - Ventricular septum: Mildly D-shaped interventricular septum suggestive of RV pressure/volume overload. - Aortic valve: There was no stenosis. - Mitral valve: There was no significant regurgitation. - Right ventricle: The cavity size was mildly dilated. Systolic function was normal. - Right atrium: The atrium was mildly dilated. - Pulmonary arteries: No complete TR doppler jet so unable to estimate PA systolic pressure. - Systemic veins: IVC measured 2.0 cm with < 50% respirophasic variation, suggesting RA pressure 8 mmHg.  Impressions:  - Normal LV size with EF 60-65%. Moderate diastolic dysfunction. Mildly dilated RV with normal systolic function. Mildly D-shaped interventricular septum suggesting RV pressure/volume overload.  CT CHEST IMPRESSION 03/2017: 1. No acute cardiopulmonary disease. Specifically, no focal airspace opacities or CT evidence of congestive heart failure. 2. The approximately 1.3 cm right lower lobe pulmonary nodule, mediastinal and suspected hilar adenopathy is grossly unchanged compared to the 04/2016 examination. Stability for nearly 1 year would be suggestive of a benign etiology however note, the October 2017 examinations were markedly degraded  secondary to patient respiratory artifact. As such, would recommend a follow-up contrast-enhanced chest CT in 6 months to ensure continued stability. Ultimately, stability for 2 years would be indicative of a benign etiology. 3.  Aortic Atherosclerosis (ICD10-I70.0).   Electronically Signed   By: Sandi Mariscal M.D.   On: 04/14/2017 13:21   CT CHEST IMPRESSION 04/2016: 1. Image quality is rather degraded by body habitus and respiratory motion. 2. 1.4 cm right lower lobe nodule. Consider one of the following in 3 months for both low-risk and high-risk individuals: (a) repeat chest CT, (b) follow-up PET-CT, or (c) tissue sampling. This recommendation follows the consensus statement: Guidelines for Management of Incidental Pulmonary Nodules Detected on CT Images: From the Fleischner Society 2017; Radiology 2017; 284:228-243. 3. Aortic atherosclerosis (ICD10-170.0). 4. Enlarged pulmonary arteries, indicative of pulmonary arterial hypertension.   Electronically Signed By: Lorin Picket M.D. On: 05/07/2016 12:03   Assessment/Plan:  1. Chronic diastolic HF - she was admitted last fall with acute exacerbation -  Her weight is down a few pounds. No significant dyspnea noted - would favor continued regimen. Overall prognosis tenuous at best.   2. Morbid obesity - this remains the crux of her issues - she is wanting to be more active - hopefully has her foot improves she will be able to.   3. HTN - BP is great on current regimen. No changes made today.   4. OSA - on CPAP  5. Hypothyroidism - followed by PCP  6. Abnormal chest CT - she is now followed by Pulmonary - has CT for next month.   Current medicines are reviewed with the patient today.  The patient does not have concerns regarding medicines other than what has been noted above.  The following changes have been made:  See above.  Labs/ tests ordered today include:   No orders of the defined types were  placed in this encounter.    Disposition:   FU with Dr. Marlou Porch in 6 months. I will be happy to see back in a year.   Patient is agreeable to this plan and will call if any problems develop in the interim.   SignedTruitt Merle, NP  10/16/2017 2:10 PM  [facility] [address]  [place], [place]  [address] Phone: [phone] Fax: [phone]

## 2017-10-16 NOTE — Patient Instructions (Addendum)
We will be checking the following labs today - NONE   Medication Instructions:    Continue with your current medicines.     Testing/Procedures To Be Arranged:  N/A  Follow-Up:   See Dr. Marlou Porch in 6 months- 04/13/18 1:20 PM    Other Special Instructions:   N/A    If you need a refill on your cardiac medications before your next appointment, please call your pharmacy.   Call the [place] office at [phone] if you have any questions, problems or concerns.

## 2017-10-18 ENCOUNTER — Other Ambulatory Visit: Payer: Self-pay

## 2017-10-18 MED ORDER — POTASSIUM CHLORIDE CRYS ER 20 MEQ PO TBCR: 20.0000 meq | EXTENDED_RELEASE_TABLET | Freq: Two times a day (BID) | ORAL | 3 refills | Status: DC

## 2017-10-18 MED ORDER — FUROSEMIDE 80 MG PO TABS: 40.0000 mg | ORAL_TABLET | Freq: Two times a day (BID) | ORAL | 3 refills | Status: DC

## 2017-10-26 ENCOUNTER — Telehealth: Payer: Self-pay | Admitting: Cardiology

## 2017-10-26 NOTE — Telephone Encounter (Signed)
Patient calling, states that she dropped off a document requesting records to the front desk about two weeks ago.  Patient is calling for an update.

## 2017-10-27 ENCOUNTER — Telehealth: Payer: Self-pay | Admitting: Cardiology

## 2017-10-27 ENCOUNTER — Telehealth: Payer: Self-pay

## 2017-10-27 ENCOUNTER — Telehealth: Payer: Self-pay | Admitting: Podiatry

## 2017-10-27 NOTE — Telephone Encounter (Signed)
Patient called back. She stated she will bring her paperwork back to office on Monday. She has a Theatre manager. I explained to her there will be a $25.00 charge ( check/money order only) she will also need to sign release. Patient is agreeable on all this.

## 2017-10-27 NOTE — Telephone Encounter (Signed)
Dr March Rummage, did you want to refer this patient to a spine specialist, if so is there someone that you prefer.

## 2017-10-27 NOTE — Telephone Encounter (Signed)
LVM stating that I had sent Dr March Rummage a message in regards to spine referral. When orders are received we will fax them over and return her phone call with information about the referral

## 2017-10-27 NOTE — Telephone Encounter (Signed)
I'm a pt of Dr. Eleanora Neighbor and the last time I saw him he was talking to me about seeing a back or spine specialist. I assumed that I would get a referral as we kind of talked about that. I haven't heard anything from anyone, so I was just doing a call back to check up and see if that was the case. My number is [phone]. I'm supposed to see him again 26 April but I think he wanted me to see someone before then but again, I haven't heard anything from anyone, so I was just calling to check. Thank you.

## 2017-10-27 NOTE — Telephone Encounter (Signed)
Called patient back this am spoke with her about the paperwork she dropped off here. Stated it was a form for physician to complete. I made her aware I was not sure of what she was speaking about. I went to front desk checked to see if there was a duplicate copy made and placed in folder and there was not.      I called her back left a voicemail to call me back so I can speak with her a little more about this maybe she can bring another copy of this of she has one to the office.

## 2017-10-30 NOTE — Telephone Encounter (Signed)
I recommended she see someone but I didn't have anyone in mind. Can we refer to someone?

## 2017-11-01 ENCOUNTER — Other Ambulatory Visit: Payer: Self-pay

## 2017-11-01 DIAGNOSIS — M792 Neuralgia and neuritis, unspecified: Secondary | ICD-10-CM

## 2017-11-01 DIAGNOSIS — G629 Polyneuropathy, unspecified: Secondary | ICD-10-CM

## 2017-11-01 DIAGNOSIS — M5416 Radiculopathy, lumbar region: Secondary | ICD-10-CM

## 2017-11-02 ENCOUNTER — Other Ambulatory Visit (INDEPENDENT_AMBULATORY_CARE_PROVIDER_SITE_OTHER): Payer: Medicaid Other

## 2017-11-02 DIAGNOSIS — R0602 Shortness of breath: Secondary | ICD-10-CM | POA: Diagnosis not present

## 2017-11-02 LAB — BASIC METABOLIC PANEL
BUN: 17 mg/dL (ref 6–23)
CO2: 31 mEq/L (ref 19–32)
CREATININE: 0.94 mg/dL (ref 0.40–1.20)
Calcium: 9.7 mg/dL (ref 8.4–10.5)
Chloride: 98 mEq/L (ref 96–112)
GFR: 77.84 mL/min (ref >60.00)
Glucose, Bld: 107 mg/dL — ABNORMAL HIGH (ref 70–99)
POTASSIUM: 3.8 meq/L (ref 3.5–5.1)
Sodium: 137 mEq/L (ref 135–145)

## 2017-11-06 ENCOUNTER — Ambulatory Visit (INDEPENDENT_AMBULATORY_CARE_PROVIDER_SITE_OTHER)
Admission: RE | Admit: 2017-11-06 | Discharge: 2017-11-06 | Disposition: A | Discharge status: Home / Self Care | Payer: Medicaid Other | Source: Ambulatory Visit | Attending: Pulmonary Disease | Admitting: Pulmonary Disease

## 2017-11-06 ENCOUNTER — Other Ambulatory Visit: Payer: Federal, State, Local not specified - PPO

## 2017-11-06 DIAGNOSIS — R911 Solitary pulmonary nodule: Secondary | ICD-10-CM

## 2017-11-06 MED ORDER — IOPAMIDOL (ISOVUE-300) INJECTION 61%
80.0000 mL | Freq: Once | INTRAVENOUS | Status: AC | PRN
  Administered 2017-11-06: 80 mL via INTRAVENOUS

## 2017-11-15 DIAGNOSIS — M79676 Pain in unspecified toe(s): Secondary | ICD-10-CM

## 2017-11-17 ENCOUNTER — Ambulatory Visit (INDEPENDENT_AMBULATORY_CARE_PROVIDER_SITE_OTHER): Payer: Medicaid Other | Admitting: Podiatry

## 2017-11-17 ENCOUNTER — Encounter: Payer: Self-pay | Admitting: Podiatry

## 2017-11-17 DIAGNOSIS — G5751 Tarsal tunnel syndrome, right lower limb: Secondary | ICD-10-CM

## 2017-11-17 DIAGNOSIS — M5416 Radiculopathy, lumbar region: Secondary | ICD-10-CM

## 2017-11-17 DIAGNOSIS — M792 Neuralgia and neuritis, unspecified: Secondary | ICD-10-CM

## 2017-11-19 NOTE — Progress Notes (Signed)
  Subjective:  Patient ID: Theresa Kirby, female    DOB: October 12, 1955,  MRN: [id number]  Chief Complaint  Patient presents with  . Foot Pain    Rt foot pain contiues in arch and lateral side of ankle   62 y.o. female returns for the above complaint.  Reports continued pain in the arch and ankle.  Possible complaints.  States that she is going through consideration for her job where she can perform a different role in the [employer].  States she did not follow-up with another provider for her back thought we are going to send a referral.  Objective:  There were no vitals filed for this visit. General AA&O x3. Normal mood and affect.  Vascular Pedal pulses palpable.  Neurologic Epicritic sensation grossly intact.  Dermatologic No open lesions. Skin normal texture and turgor.  Orthopedic:  Pain with patient about the medial aspect of both ankles   Assessment & Plan:  Patient was evaluated and treated and all questions answered.  Tarsal tunnel syndrome roght -Repeat injection to the tarsal tunnel bilateral -Filled out duty requirement form -Continue ankle brace  Procedure: Neuroma Injection Location: Right tarsal tunnel  Skin Prep: Alcohol. -Injectate: 1 cc 0.5% marcaine plain, 1 cc dexamethasone phosphate. Disposition: Patient tolerated procedure well. Injection site dressed with a band-aid.  Lumbar radiculopathy -Will check on status of referral for patient  Return in about 3 weeks (around 12/08/2017) for Tarsal Tunnel Syndrome.

## 2017-11-21 ENCOUNTER — Telehealth: Payer: Self-pay | Admitting: *Deleted

## 2017-11-21 NOTE — Telephone Encounter (Signed)
-----   Message from Evelina Bucy, DPM sent at 11/19/2017  5:49 PM EDT ----- Regarding: Referral We sent a referral for this patient for her spine can be double check the status of it

## 2017-11-21 NOTE — Telephone Encounter (Signed)
Left message informing pt the referral to [facility] had been made on 11/01/2017 and she could call for her appt [phone].

## 2017-11-29 ENCOUNTER — Encounter: Payer: Self-pay | Admitting: Pulmonary Disease

## 2017-11-29 ENCOUNTER — Ambulatory Visit: Payer: Medicaid Other | Admitting: Pulmonary Disease

## 2017-11-29 DIAGNOSIS — R911 Solitary pulmonary nodule: Secondary | ICD-10-CM

## 2017-11-29 DIAGNOSIS — R590 Localized enlarged lymph nodes: Secondary | ICD-10-CM

## 2017-11-29 DIAGNOSIS — G4733 Obstructive sleep apnea (adult) (pediatric): Secondary | ICD-10-CM | POA: Diagnosis not present

## 2017-11-29 NOTE — Assessment & Plan Note (Addendum)
Stable in size, six-month follow-up Will consider PET scan if increasing size or new nodules/lymphadenopathy

## 2017-11-29 NOTE — Assessment & Plan Note (Signed)
Appears stable since October 2017, probably benign cause Repeat CT chest with contrast in October 2019

## 2017-11-29 NOTE — Addendum Note (Signed)
Addended by: [place] Salts on: 11/29/2017 12:19 PM   Modules accepted: Orders

## 2017-11-29 NOTE — Assessment & Plan Note (Signed)
Weight loss encouraged, compliance with goal of at least 4-6 hrs every night is the expectation. Advised against medications with sedative side effects Cautioned against driving when sleepy - understanding that sleepiness will vary on a day to day basis  Fatty liver also noted, weight loss advised, she will check LFTs with PCP

## 2017-11-29 NOTE — Progress Notes (Signed)
   Subjective:    Patient ID: Theresa Kirby, female    DOB: [birth year], 62 y.o.   MRN: [id number]  HPI  62 year old obese woman for FU of pulmonary nodule, incidentally noted on a CT scan done in October 2017. 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  She has diastolic heart failure, OSA on CPAP  & a pituitary microadenoma diagnosed in 2011. She has adult onset asthma from her 92s and has been well controlled on Advair for many years, triggers being allergies to dust and trees.  She is a lifetime never smoker. She has diabetes controlled with oral medications.  We discussed CT chest and personally reviewed images which showed stable nodule and lymph nodes.  Other finding of fatty liver  Asthma symptoms are stable, no nocturnal wheezing. She is compliant with CPAP and uses distilled water which is preventing dryness   Significant tests/ events reviewed  CT angiogram 04/2016 showed 1.3 cm nodule in the right lower lobe but was not very well characterized due to motion artifact.  There was also enlarged mediastinal and peribronchial lymph nodes measuring up to 1.7 cm.  CT chest with contrast  03/2017 which showed stable nodule and enlarged right paratracheal and left hilar lymph nodes as prior.    chest x-rays from 2010 and 2013 showed left hilar fullness but has been attributed to vascular congestion and prominent pulmonary arteries  ACE 19    Review of Systems Patient denies significant dyspnea,cough, hemoptysis,  chest pain, palpitations, pedal edema, orthopnea, paroxysmal nocturnal dyspnea, lightheadedness, nausea, vomiting, abdominal or  leg pains      Objective:   Physical Exam   Gen. Pleasant, obese, in no distress ENT - no lesions, no post nasal drip Neck: No JVD, no thyromegaly, no carotid bruits Lungs: no use of accessory muscles, no dullness to percussion, decreased without rales or rhonchi  Cardiovascular: Rhythm regular, heart sounds  normal, no murmurs or gallops, no  peripheral edema Musculoskeletal: No deformities, no cyanosis or clubbing , no tremors        Assessment & Plan:

## 2017-11-29 NOTE — Patient Instructions (Signed)
Lung nodule and lymph nodes in your chest are stable. Follow-up CT chest with contrast in October 2019  We discussed fatty liver, you can get liver function test done with your PCP

## 2017-11-30 IMAGING — CT CT CHEST W/O CM
2 of 3 series · 15 of 36 positions shown, 18 images · non-contrast
Comparison: 05/07/2016; 05/06/2016 ; chest radiograph - 03/13/2017

CLINICAL DATA: Severe shortness of breath. Abnormal chest CT.
History of CHF.

EXAM:
CT CHEST WITHOUT CONTRAST
TECHNIQUE: Multidetector CT imaging of the chest was performed following the
standard protocol without IV contrast.

[Series 2: thorax · axial · 0.84mm/px · z∈[-330,-70]mm · 12 of 154 slices shown, 15 images]
[im 12/154  mediastinal]
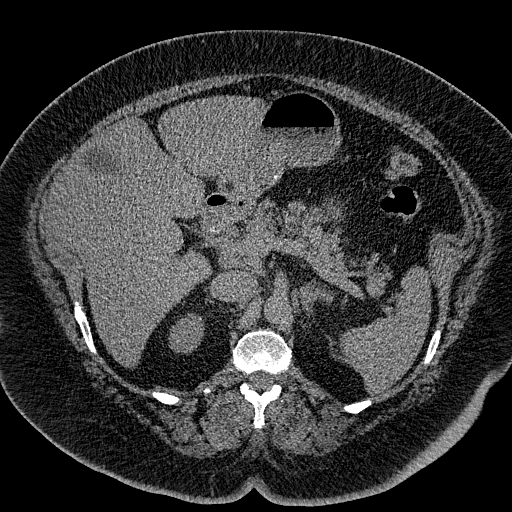
[im 12/154  lung]
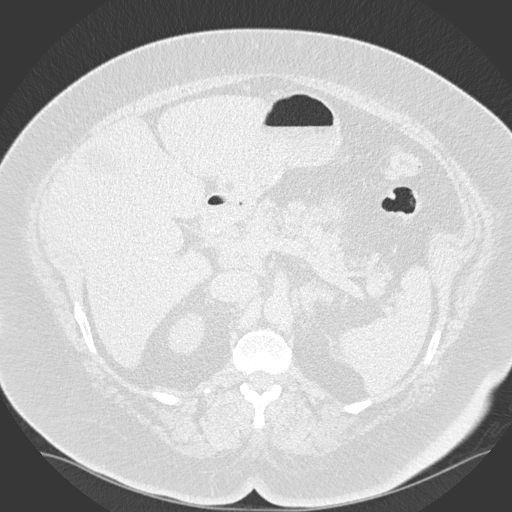
[im 23/154  lung]
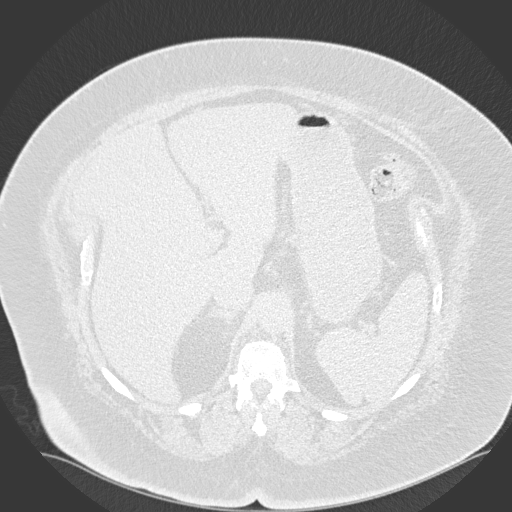
[im 35/154  lung]
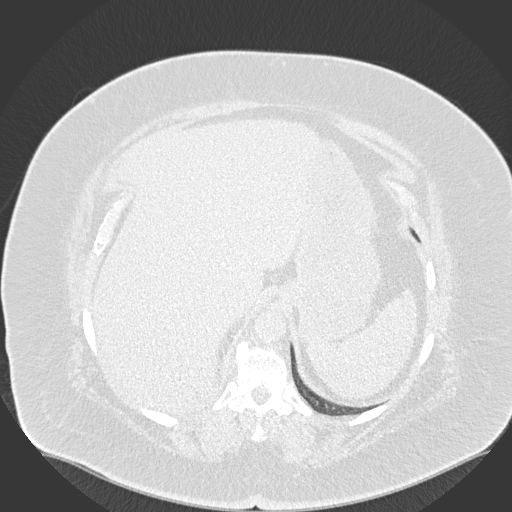
[im 46/154  lung]
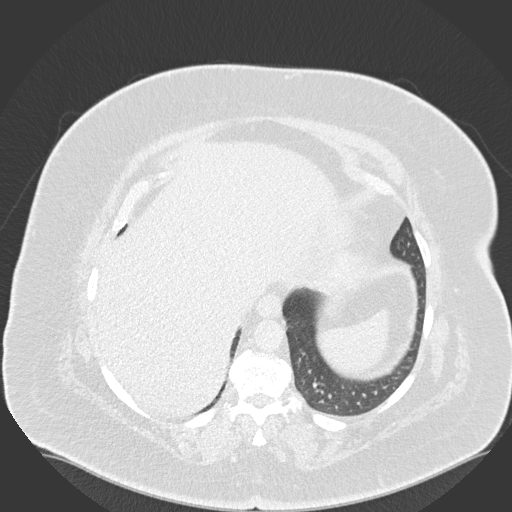
[im 57/154  mediastinal]
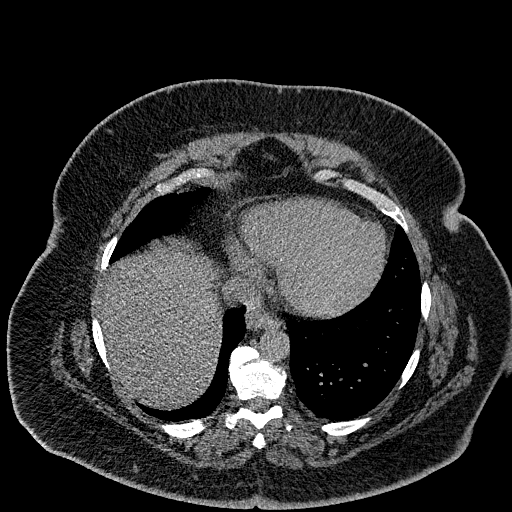
[im 57/154  lung]
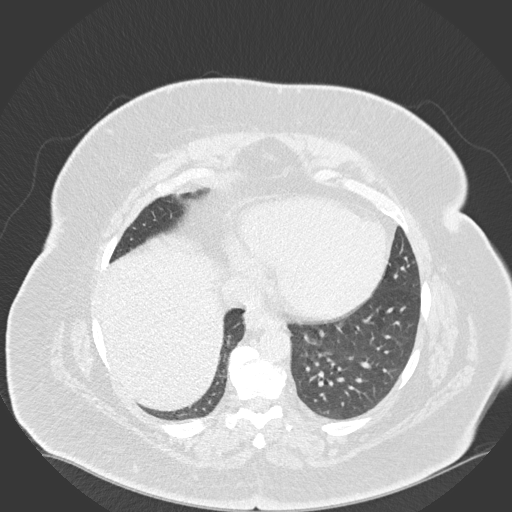
[im 69/154  lung]
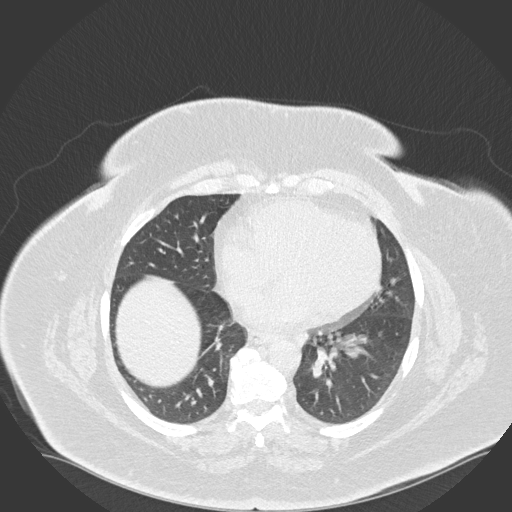
[im 86/154  lung]
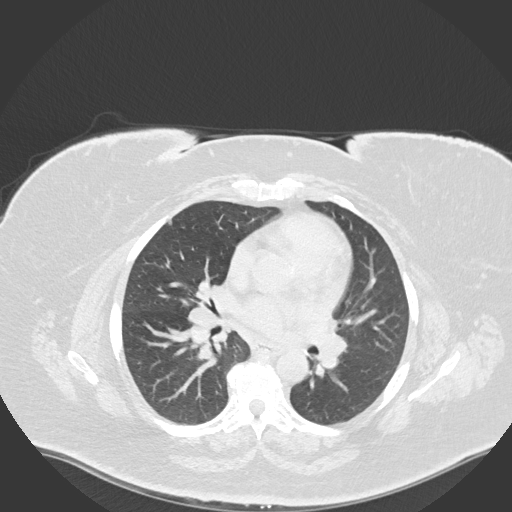
[im 97/154  lung]
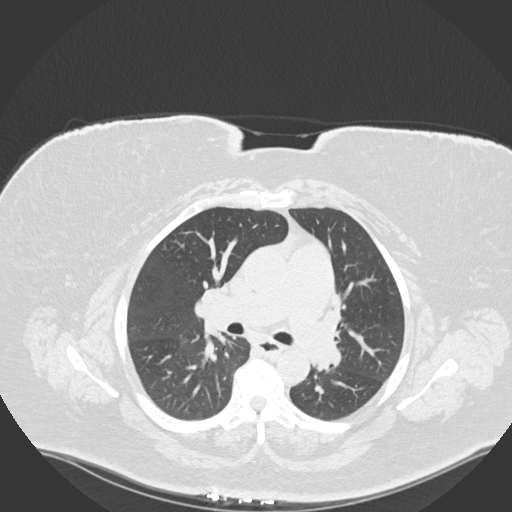
[im 108/154  mediastinal]
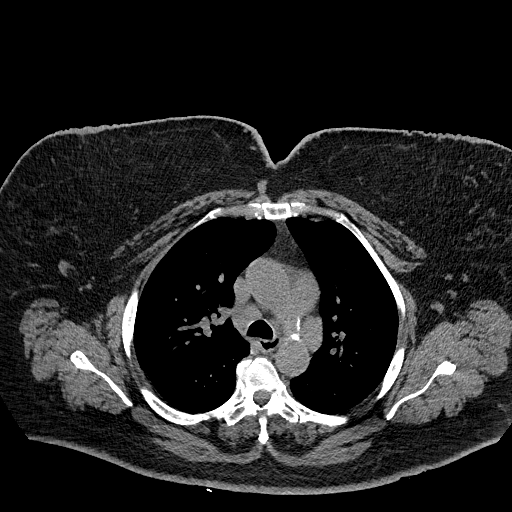
[im 108/154  lung]
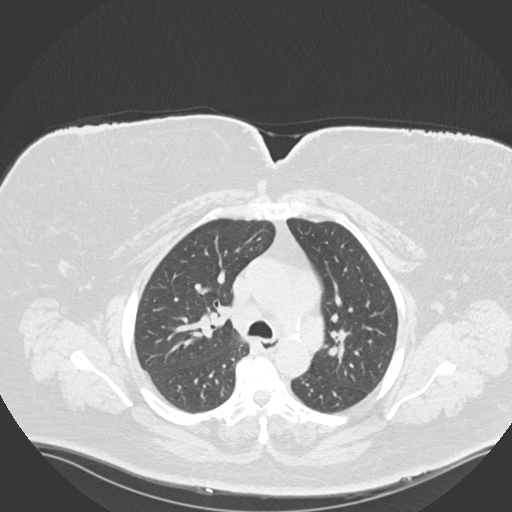
[im 120/154  lung]
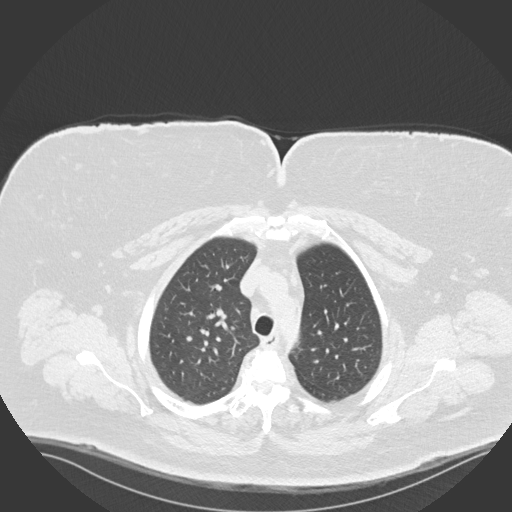
[im 131/154  lung]
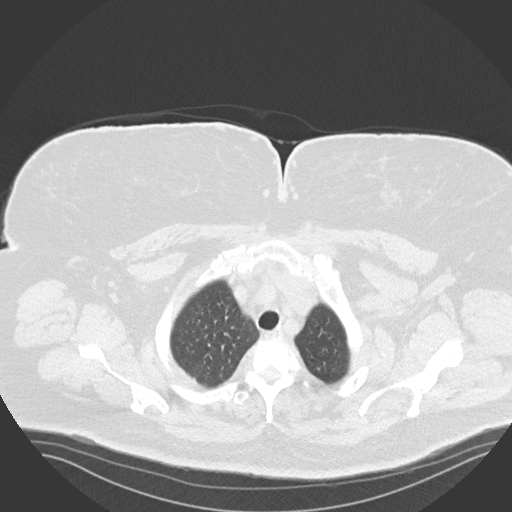
[im 142/154  lung]
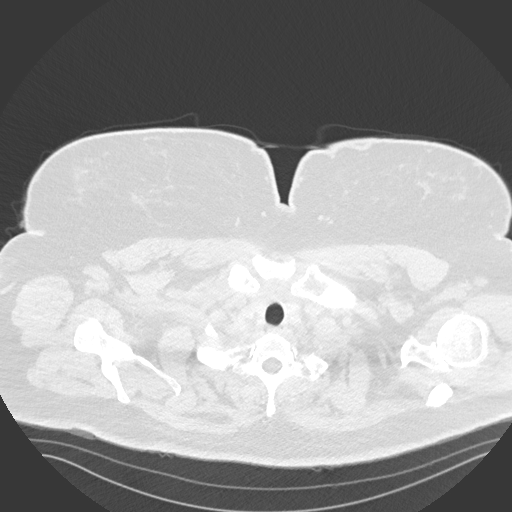

[Series 5: coronal · coronal · 0.62mm/px · 3 of 168 slices shown]
[im 34/168  lung]
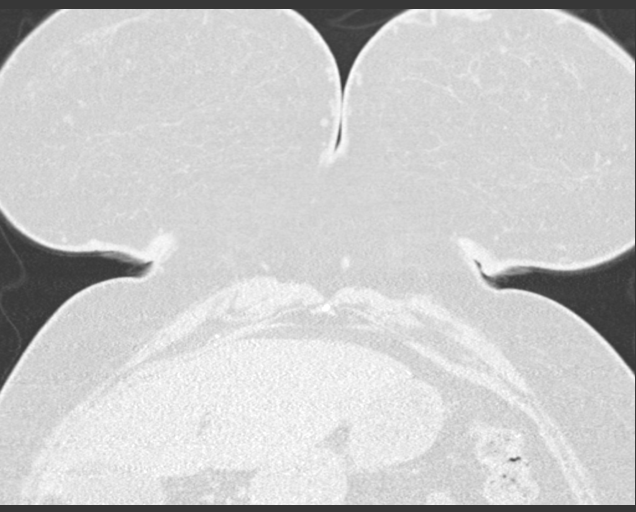
[im 67/168  lung]
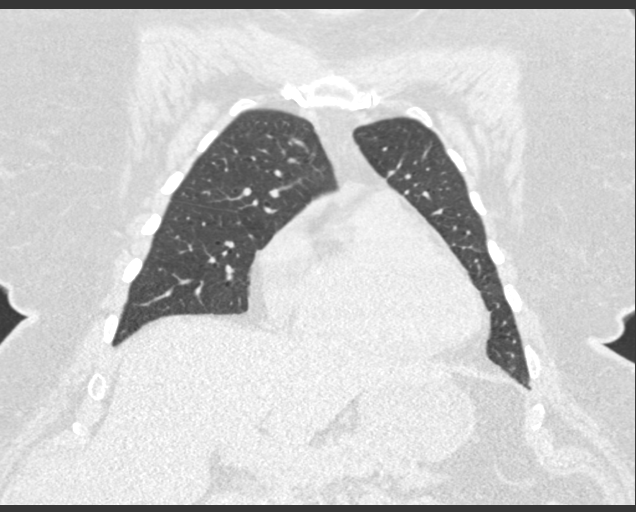
[im 101/168  lung]
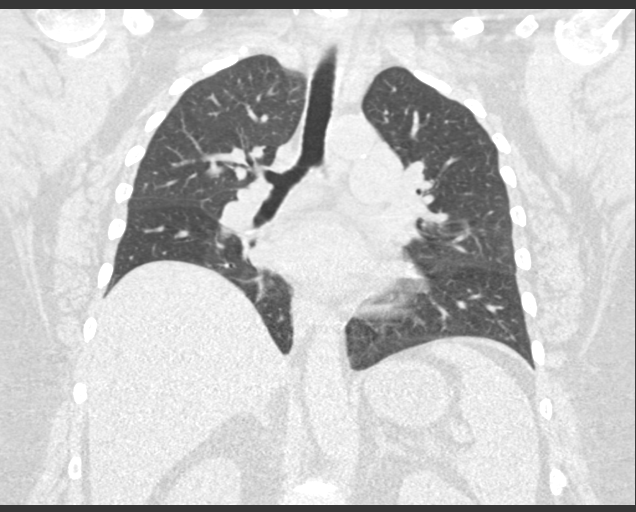

[15 of 36 positions shown; findings below may reference images not displayed]

FINDINGS: Cardiovascular: Normal heart size. Coronary artery calcifications.
Scattered atherosclerotic plaque within a normal caliber thoracic
aorta. Suspected bovine configuration of the aortic arch. Note is
made of an aortic nipple.

Mediastinum/Nodes: Re- demonstrated mediastinal adenopathy with
index right-sided paratracheal lymph node measuring 1.6 cm in
greatest short axis diameter (image 46, series 2) and index AP
window lymph node measuring 1.8 cm (image 47), similar to the
[DATE] examination. Hilar adenopathy is suspected though
suboptimally evaluated on this noncontrast examination. No axillary
adenopathy.

Lungs/Pleura: No new or enlarging pulmonary nodules.

The approximately 1.3 x 1.1 cm right lower lobe pulmonary nodule
(image 76, series 3), is unchanged compared to the 05/07/2016
examination, previously, 1.4 x 1.0 cm though admittedly both CT
scans performed April 2016 were markedly degraded secondary to
patient respiratory artifact.

Overall improved aeration of lungs. No focal airspace opacities. No
pleural effusion or pneumothorax. The central pulmonary airways
appear patent.

Upper Abdomen: Noncontrast evaluation of the upper abdomen
demonstrates a tiny splenule about the splenic hilum.

Musculoskeletal: No acute or aggressive osseous abnormalities.
Stigmata of DISH within the thoracic spine. Regional soft tissues
appear normal. Normal noncontrast appearance of the thyroid gland.
IMPRESSION: 1. No acute cardiopulmonary disease. Specifically, no focal airspace
opacities or CT evidence of congestive heart failure.
2. The approximately 1.3 cm right lower lobe pulmonary nodule,
mediastinal and suspected hilar adenopathy is grossly unchanged
compared to the [DATE] examination. Stability for nearly 1 year
would be suggestive of a benign etiology however note, the April 2016 examinations were markedly degraded secondary to patient
respiratory artifact. As such, would recommend a follow-up
contrast-enhanced chest CT in 6 months to ensure continued
stability. Ultimately, stability for 2 years would be indicative of
a benign etiology.
3.  Aortic Atherosclerosis ([id number].G).

## 2017-12-21 ENCOUNTER — Ambulatory Visit: Payer: Self-pay | Admitting: Podiatry

## 2017-12-29 ENCOUNTER — Ambulatory Visit: Payer: Medicaid Other | Admitting: Podiatry

## 2017-12-29 ENCOUNTER — Encounter: Payer: Self-pay | Admitting: Podiatry

## 2017-12-29 DIAGNOSIS — G5751 Tarsal tunnel syndrome, right lower limb: Secondary | ICD-10-CM

## 2017-12-29 DIAGNOSIS — M5416 Radiculopathy, lumbar region: Secondary | ICD-10-CM

## 2018-01-09 DIAGNOSIS — M79676 Pain in unspecified toe(s): Secondary | ICD-10-CM

## 2018-03-02 ENCOUNTER — Ambulatory Visit: Payer: Medicaid Other | Admitting: Podiatry

## 2018-03-15 ENCOUNTER — Ambulatory Visit: Payer: Medicaid Other | Admitting: Podiatry

## 2018-03-15 DIAGNOSIS — M792 Neuralgia and neuritis, unspecified: Secondary | ICD-10-CM

## 2018-03-15 DIAGNOSIS — G629 Polyneuropathy, unspecified: Secondary | ICD-10-CM | POA: Diagnosis not present

## 2018-03-15 DIAGNOSIS — G5751 Tarsal tunnel syndrome, right lower limb: Secondary | ICD-10-CM

## 2018-03-15 DIAGNOSIS — M5416 Radiculopathy, lumbar region: Secondary | ICD-10-CM | POA: Diagnosis not present

## 2018-03-15 NOTE — Progress Notes (Signed)
Subjective:  Patient ID: Theresa Kirby, female    DOB: [birth year],  MRN: [id number]  Chief Complaint  Patient presents with  . Heel Spurs    tarsal tunnel syndrome right ankle - doing some better   62 y.o. female presents with the above complaint.  Reports her right ankle is doing some better.  Still wearing her brace.  Complains more of soreness today the numbness.  Injections have helped.  Review of Systems: Negative except as noted in the HPI. Denies N/V/F/Ch.  Past Medical History:  Diagnosis Date  . Arthritis    knees  . Asthma   . Iron deficiency anemia   . Morbid obesity ([place])   . Rhabdomyolysis 04/2016   normal compartment pressures  . Sleep apnea    uses cpap, setting is unknown, does not wear it consistently  . Thyroid disease     Current Outpatient Medications:  .  acetaminophen (TYLENOL) 325 MG tablet, Take 650 mg by mouth every 6 (six) hours as needed (pain). , Disp: , Rfl:  .  albuterol (PROAIR HFA) 108 (90 BASE) MCG/ACT inhaler, Inhale 2 puffs into the lungs every 6 (six) hours as needed for wheezing or shortness of breath (rescue). , Disp: , Rfl:  .  aspirin EC 81 MG EC tablet, Take 1 tablet (81 mg total) by mouth daily., Disp: , Rfl:  .  diclofenac sodium (VOLTAREN) 1 % GEL, Apply 2 g topically 2 (two) times daily as needed (pain). Applies to knees., Disp: , Rfl:  .  ferrous sulfate 325 (65 FE) MG tablet, Take 325 mg by mouth 3 (three) times daily with meals., Disp: , Rfl:  .  Fluticasone-Salmeterol (ADVAIR) 250-50 MCG/DOSE AEPB, Inhale 1 puff into the lungs 2 (two) times daily., Disp: , Rfl:  .  furosemide (LASIX) 80 MG tablet, Take 0.5 tablets (40 mg total) by mouth 2 (two) times daily., Disp: 90 tablet, Rfl: 3 .  metFORMIN (GLUCOPHAGE-XR) 500 MG 24 hr tablet, TAKE 1 Tablet BY MOUTH EVERY DAY WITH EVENING MEAL, Disp: , Rfl: 3 .  montelukast (SINGULAIR) 10 MG tablet, Take 10 mg by mouth at bedtime as needed (allergies.). , Disp: , Rfl:  .  Multiple Vitamin  (MULTIVITAMIN) capsule, Take 1 capsule by mouth daily., Disp: , Rfl:  .  Omega-3 Fatty Acids (FISH OIL PO), Take 1 capsule by mouth daily., Disp: , Rfl:  .  potassium chloride SA (K-DUR,KLOR-CON) 20 MEQ tablet, Take 1 tablet (20 mEq total) by mouth 2 (two) times daily., Disp: 180 tablet, Rfl: 3  Social History   Tobacco Use  Smoking Status Never Smoker  Smokeless Tobacco Never Used    Allergies  Allergen Reactions  . Sulfa Antibiotics Hives and Shortness Of Breath  . Wilder Glade [Dapagliflozin]     Blisters   Objective:  There were no vitals filed for this visit. There is no height or weight on file to calculate BMI. Constitutional Well developed. Well nourished.  Vascular Dorsalis pedis pulses palpable bilaterally. Posterior tibial pulses palpable bilaterally. Capillary refill normal to all digits.  No cyanosis or clubbing noted. Pedal hair growth normal.  Neurologic Normal speech. Oriented to person, place, and time. Epicritic sensation to light touch grossly present bilaterally.  Dermatologic Nails well groomed and normal in appearance. No open wounds. No skin lesions.  Orthopedic: Normal joint ROM without pain or crepitus bilaterally. No visible deformities. Pain palpation about the posterior tibial tendon course and course of posterior tibial nerve.   Radiographs: None Assessment:  1. Tarsal tunnel syndrome of right side   2. Lumbar radiculopathy   3. Neuralgia and neuritis   4. Polyneuropathy    Plan:  Patient was evaluated and treated and all questions answered.  Tarsal tunnel syndrome of right ankle -Offered injection.  Patient declined today as she is going for disability assessment soon. -Discussed patient possible bracing option however limited options while patient is currently medicated.  Return in about 4 weeks (around 04/12/2018) for tarsal tunnel right.

## 2018-03-29 ENCOUNTER — Encounter: Payer: Self-pay | Admitting: Cardiology

## 2018-04-04 NOTE — Progress Notes (Signed)
  Subjective:  Patient ID: Theresa Kirby, female    DOB: Jul 12, 1956,  MRN: [id number]  Chief Complaint  Patient presents with  . Foot Pain    right - doing somewhat better   61 y.o. female returns for the above complaint.  States the right foot is doing somewhat better.  Trying to work with the [employer] to work a sedentary job.  Objective:  There were no vitals filed for this visit. General AA&O x3. Normal mood and affect.  Vascular Pedal pulses palpable.  Neurologic Epicritic sensation grossly intact.  Dermatologic No open lesions. Skin normal texture and turgor.  Orthopedic:  Pain with patient about the medial aspect of both ankles   Assessment & Plan:  Patient was evaluated and treated and all questions answered.  Tarsal tunnel syndrome with radiculopathy -No injections today -Discussed continued use of brace when needed.  Return in about 2 months (around 02/28/2018).

## 2018-04-11 ENCOUNTER — Telehealth: Payer: Self-pay | Admitting: Cardiology

## 2018-04-11 NOTE — Telephone Encounter (Signed)
Pt calling with complaints of lower back pain and some abdominal pressure for about a week does not feel is urinating a lot .Per pt weight is fluctuating.Pt is currently taking Furosemide 40 mg Bid . Will forward to Dr Marlou Porch for review and recommendations./cy

## 2018-04-11 NOTE — Telephone Encounter (Signed)
New message:      Pt states she is currently having some really bad back pain and she is taking her fluid pills but she still feels she is unable to urinate like she normal would when she takes the pill.

## 2018-04-12 ENCOUNTER — Ambulatory Visit: Payer: Medicaid Other | Admitting: Podiatry

## 2018-04-12 DIAGNOSIS — G5751 Tarsal tunnel syndrome, right lower limb: Secondary | ICD-10-CM | POA: Diagnosis not present

## 2018-04-12 DIAGNOSIS — M792 Neuralgia and neuritis, unspecified: Secondary | ICD-10-CM

## 2018-04-12 DIAGNOSIS — M5416 Radiculopathy, lumbar region: Secondary | ICD-10-CM

## 2018-04-12 NOTE — Telephone Encounter (Signed)
Please address first with PCP. Thanks Candee Furbish, MD

## 2018-04-12 NOTE — Telephone Encounter (Signed)
Left message I was calling to follow up with Theresa Kirby about Theresa Kirby back pain, Dr Marlou Porch would like for Theresa Kirby to f/u with Theresa Kirby PCP for evaluation but to c/b with any questions or concerns.

## 2018-04-12 NOTE — Progress Notes (Signed)
Subjective:  Patient ID: Theresa Kirby, female    DOB: April 07, 1956,  MRN: [id number]  Chief Complaint  Patient presents with  . Foot Pain    1 month follow up bilateral tarsal tunnel   62 y.o. female presents with the above complaint.  States that her ankle is doing better.  Would like to consider injection today.  States things are doing better with her Workmen's Comp. appeal.  Review of Systems: Negative except as noted in the HPI. Denies N/V/F/Ch.  Past Medical History:  Diagnosis Date  . Arthritis    knees  . Asthma   . Iron deficiency anemia   . Morbid obesity ([place])   . Rhabdomyolysis 04/2016   normal compartment pressures  . Sleep apnea    uses cpap, setting is unknown, does not wear it consistently  . Thyroid disease     Current Outpatient Medications:  .  acetaminophen (TYLENOL) 325 MG tablet, Take 650 mg by mouth every 6 (six) hours as needed (pain). , Disp: , Rfl:  .  albuterol (PROAIR HFA) 108 (90 BASE) MCG/ACT inhaler, Inhale 2 puffs into the lungs every 6 (six) hours as needed for wheezing or shortness of breath (rescue). , Disp: , Rfl:  .  aspirin EC 81 MG EC tablet, Take 1 tablet (81 mg total) by mouth daily., Disp: , Rfl:  .  diclofenac sodium (VOLTAREN) 1 % GEL, Apply 2 g topically 2 (two) times daily as needed (pain). Applies to knees., Disp: , Rfl:  .  ferrous sulfate 325 (65 FE) MG tablet, Take 325 mg by mouth 3 (three) times daily with meals., Disp: , Rfl:  .  Fluticasone-Salmeterol (ADVAIR) 250-50 MCG/DOSE AEPB, Inhale 1 puff into the lungs 2 (two) times daily., Disp: , Rfl:  .  furosemide (LASIX) 80 MG tablet, Take 0.5 tablets (40 mg total) by mouth 2 (two) times daily., Disp: 90 tablet, Rfl: 3 .  metFORMIN (GLUCOPHAGE-XR) 500 MG 24 hr tablet, TAKE 1 Tablet BY MOUTH EVERY DAY WITH EVENING MEAL, Disp: , Rfl: 3 .  montelukast (SINGULAIR) 10 MG tablet, Take 10 mg by mouth at bedtime as needed (allergies.). , Disp: , Rfl:  .  Multiple Vitamin (MULTIVITAMIN)  capsule, Take 1 capsule by mouth daily., Disp: , Rfl:  .  Omega-3 Fatty Acids (FISH OIL PO), Take 1 capsule by mouth daily., Disp: , Rfl:  .  potassium chloride SA (K-DUR,KLOR-CON) 20 MEQ tablet, Take 1 tablet (20 mEq total) by mouth 2 (two) times daily., Disp: 180 tablet, Rfl: 3  Social History   Tobacco Use  Smoking Status Never Smoker  Smokeless Tobacco Never Used    Allergies  Allergen Reactions  . Sulfa Antibiotics Hives and Shortness Of Breath  . Wilder Glade [Dapagliflozin]     Blisters   Objective:  There were no vitals filed for this visit. There is no height or weight on file to calculate BMI. Constitutional Well developed. Well nourished.  Vascular Dorsalis pedis pulses palpable bilaterally. Posterior tibial pulses palpable bilaterally. Capillary refill normal to all digits.  No cyanosis or clubbing noted. Pedal hair growth normal.  Neurologic Normal speech. Oriented to person, place, and time. Epicritic sensation to light touch grossly present bilaterally.  Dermatologic Nails well groomed and normal in appearance. No open wounds. No skin lesions.  Orthopedic: Normal joint ROM without pain or crepitus bilaterally. No visible deformities. Pain palpation about the posterior tibial tendon course and course of posterior tibial nerve.   Radiographs: None Assessment:   1.  Tarsal tunnel syndrome of right side   2. Neuralgia and neuritis   3. Lumbar radiculopathy    Plan:  Patient was evaluated and treated and all questions answered.  Tarsal tunnel syndrome of right ankle/PT tendonitis -Injection delivered to the right tarsal tunnel as below.  Patient has has experienced results of symptoms with previous tarsal tunnel injection. -Continue ankle brace as needed  Procedure: Injection Nerve Consent: Verbal consent obtained. Location: Right  Tarsal tunnel Skin Prep: Alcohol. Injectate: 1 cc 0.5% marcaine plain, 1 cc dexamethasone phosphate Disposition: Patient  tolerated procedure well. Injection site dressed with a band-aid.     Return in about 6 weeks (around 05/24/2018) for Tarsal Tunnel Right.

## 2018-04-13 ENCOUNTER — Ambulatory Visit: Payer: Federal, State, Local not specified - PPO | Admitting: Cardiology

## 2018-04-13 ENCOUNTER — Encounter: Payer: Self-pay | Admitting: Cardiology

## 2018-04-13 VITALS — BP 118/70 | HR 58 | Ht 64.0 in | Wt 310.8 lb

## 2018-04-13 DIAGNOSIS — I5032 Chronic diastolic (congestive) heart failure: Secondary | ICD-10-CM | POA: Diagnosis not present

## 2018-04-13 DIAGNOSIS — R911 Solitary pulmonary nodule: Secondary | ICD-10-CM | POA: Diagnosis not present

## 2018-04-13 DIAGNOSIS — R9389 Abnormal findings on diagnostic imaging of other specified body structures: Secondary | ICD-10-CM | POA: Diagnosis not present

## 2018-04-13 NOTE — Patient Instructions (Signed)
Medication Instructions:  The current medical regimen is effective;  continue present plan and medications.  Follow-Up: Follow up in 6 months with Lori Gerhardt, NP.  You will receive a letter in the mail 2 months before you are due.  Please call us when you receive this letter to schedule your follow up appointment.  Follow up in 1 year with Dr. Skains.  You will receive a letter in the mail 2 months before you are due.  Please call us when you receive this letter to schedule your follow up appointment.  If you need a refill on your cardiac medications before your next appointment, please call your pharmacy.  Thank you for choosing [facility]!!     

## 2018-04-13 NOTE — Progress Notes (Signed)
Cardiology Office Note:    Date:  04/13/2018   ID:  Theresa Kirby, DOB [birth year], MRN [id number]  PCP:  Merrilee Seashore, MD  Cardiologist:  Candee Furbish, MD    Referring MD: Merrilee Seashore, MD     History of Present Illness:    Theresa Kirby is a 62 y.o. female with a hx of hospitalization in August 2018 for progressive shortness of breath 20 pound weight gain, morbid obesity sleep apnea here for follow-up of shortness of breath.  She was diuresed a total of 12 L during hospital.  After going home, she felt as though her dizziness worsened.  This improved after stopping Lasix for a few days.  04/05/2018- chief complaint follow-up shortness of breath.  Prior office visit on 10/16/2017 with Truitt Merle, NP reviewed, she had hurt her foot, limited activity at work.  Workmen's Comp.  No chest pain, no cough.  Pulmonary has seen her and is following up with lung nodules. Saw foot MD, shot improved. Swelling has improved.   Denies any fevers chills nausea vomiting syncope orthopnea PND.    Past Medical History:  Diagnosis Date  . Arthritis    knees  . Asthma   . Iron deficiency anemia   . Morbid obesity ([place])   . Rhabdomyolysis 04/2016   normal compartment pressures  . Sleep apnea    uses cpap, setting is unknown, does not wear it consistently  . Thyroid disease     Past Surgical History:  Procedure Laterality Date  . CESAREAN SECTION    . COLONOSCOPY WITH PROPOFOL N/A 07/21/2015   Procedure: COLONOSCOPY WITH PROPOFOL;  Surgeon: Juanita Craver, MD;  Location: [facility] END[facility]COPY;  Service: Endoscopy;  Laterality: N/A;  . DILATION AND CURETTAGE OF UTERUS    . ENDOMETRIAL ABLATION    . GANGLION CYST EXCISION     rt arm  . SHOULDER SURGERY Left    Rotator cuff repair    Current Medications: Current Meds  Medication Sig  . acetaminophen (TYLENOL) 325 MG tablet Take 650 mg by mouth every 6 (six) hours as needed (pain).   Marland Kitchen albuterol (PROAIR HFA) 108 (90 BASE) MCG/ACT  inhaler Inhale 2 puffs into the lungs every 6 (six) hours as needed for wheezing or shortness of breath (rescue).   Marland Kitchen aspirin EC 81 MG EC tablet Take 1 tablet (81 mg total) by mouth daily.  . diclofenac sodium (VOLTAREN) 1 % GEL Apply 2 g topically 2 (two) times daily as needed (pain). Applies to knees.  . ferrous sulfate 325 (65 FE) MG tablet Take 325 mg by mouth 3 (three) times daily with meals.  . Fluticasone-Salmeterol (ADVAIR) 250-50 MCG/DOSE AEPB Inhale 1 puff into the lungs 2 (two) times daily.  . furosemide (LASIX) 80 MG tablet Take 0.5 tablets (40 mg total) by mouth 2 (two) times daily.  . metFORMIN (GLUCOPHAGE-XR) 500 MG 24 hr tablet TAKE 1 Tablet BY MOUTH EVERY DAY WITH EVENING MEAL  . montelukast (SINGULAIR) 10 MG tablet Take 10 mg by mouth at bedtime as needed (allergies.).   Marland Kitchen Multiple Vitamin (MULTIVITAMIN) capsule Take 1 capsule by mouth daily.  . Omega-3 Fatty Acids (FISH OIL PO) Take 1 capsule by mouth daily.  . potassium chloride SA (K-DUR,KLOR-CON) 20 MEQ tablet Take 1 tablet (20 mEq total) by mouth 2 (two) times daily.     Allergies:   Sulfa antibiotics and Farxiga [dapagliflozin]   Social History   Socioeconomic History  . Marital status: Single    Spouse  name: Not on file  . Number of children: Not on file  . Years of education: Not on file  . Highest education level: Not on file  Occupational History  . 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 Financial resource strain: Not on file  . Food insecurity:    Worry: Not on file    Inability: Not on file  . Transportation needs:    Medical: Not on file    Non-medical: Not on file  Tobacco Use  . Smoking status: Never Smoker  . Smokeless tobacco: Never Used  Substance and Sexual Activity  . Alcohol use: No  . Drug use: No  . Sexual activity: Not Currently    Birth control/protection: None  Lifestyle  . Physical activity:    Days per week: Not on file    Minutes per session: Not  on file  . Stress: Not on file  Relationships  . Social connections:    Talks on phone: Not on file    Gets together: Not on file    Attends religious service: Not on file    Active member of club or organization: Not on file    Attends meetings of clubs or organizations: Not on file    Relationship status: Not on file  Other Topics Concern  . Not on file  Social History Narrative  . Not on file     Family History: The patient's family history includes Heart disease in her maternal grandmother and mother; Heart failure in her mother; Hypertension in her brother and maternal grandmother. ROS:   Please see the history of present illness.   All other review of systems negative.   EKGs/Labs/Other Studies Reviewed:    The following studies were reviewed today: Echo Study Conclusions 02/2017  - Left ventricle: The cavity size was normal. Wall thickness was normal. Systolic function was normal. The estimated ejection fraction was in the range of 60% to 65%. Although no diagnostic regional wall motion abnormality was identified, this possibility cannot be completely excluded on the basis of this study. Features are consistent with a pseudonormal left ventricular filling pattern, with concomitant abnormal relaxation and increased filling pressure (grade 2 diastolic dysfunction). - Ventricular septum: Mildly D-shaped interventricular septum suggestive of RV pressure/volume overload. - Aortic valve: There was no stenosis. - Mitral valve: There was no significant regurgitation. - Right ventricle: The cavity size was mildly dilated. Systolic function was normal. - Right atrium: The atrium was mildly dilated. - Pulmonary arteries: No complete TR doppler jet so unable to estimate PA systolic pressure. - Systemic veins: IVC measured 2.0 cm with < 50% respirophasic variation, suggesting RA pressure 8 mmHg.  Impressions:  - Normal LV size with EF 60-65%. Moderate  diastolic dysfunction. Mildly dilated RV with normal systolic function. Mildly D-shaped interventricular septum suggesting RV pressure/volume overload.  CT CHEST IMPRESSION 04/2016: 1. Image quality is rather degraded by body habitus and respiratory motion. 2. 1.4 cm right lower lobe nodule. Consider one of the following in 3 months for both low-risk and high-risk individuals: (a) repeat chest CT, (b) follow-up PET-CT, or (c) tissue sampling. This recommendation follows the consensus statement: Guidelines for Management of Incidental Pulmonary Nodules Detected on CT Images: From the Fleischner Society 2017; Radiology 2017; 284:228-243. 3. Aortic atherosclerosis (ICD10-170.0). 4. Enlarged pulmonary arteries, indicative of pulmonary arterial hypertension.   Electronically Signed By: Lorin Picket M.D. On: 05/07/2016 12:03   EKG: 04/13/2018-sinus bradycardia rate 58 with no other significant  abnormalities personally reviewed and interpreted  Recent Labs: 11/02/2017: BUN 17; Creatinine, Ser 0.94; Potassium 3.8; Sodium 137  Recent Lipid Panel    Component Value Date/Time   CHOL 151 03/14/2017 0427   TRIG 107 03/14/2017 0427   HDL 40 (L) 03/14/2017 0427   CHOLHDL 3.8 03/14/2017 0427   VLDL 21 03/14/2017 0427   LDLCALC 90 03/14/2017 0427    Physical Exam:    VS:  BP 118/70   Pulse (!) 58   Ht 5\' 4"  (1.626 m)   Wt (!) 310 lb 12.8 oz (141 kg)   LMP 05/12/2012   SpO2 95%   BMI 53.35 kg/m     Wt Readings from Last 3 Encounters:  04/13/18 (!) 310 lb 12.8 oz (141 kg)  11/29/17 (!) 324 lb (147 kg)  10/16/17 (!) 327 lb (148.3 kg)     GEN:  Well nourished, well developed in no acute distress, obese HEENT: Normal NECK: No JVD; No carotid bruits LYMPHATICS: No lymphadenopathy CARDIAC: RRR, no murmurs, rubs, gallops RESPIRATORY:  Clear to auscultation without rales, wheezing or rhonchi  ABDOMEN: Soft, non-tender, non-distended MUSCULOSKELETAL:  No edema; No  deformity  SKIN: Warm and dry NEUROLOGIC:  Alert and oriented x 3 PSYCHIATRIC:  Normal affect   ASSESSMENT:    1. Chronic diastolic heart failure ([place])   2. Abnormal chest CT   3. Morbid obesity ([place])   4. Pulmonary nodule    PLAN:    In order of problems listed above:  Chronic diastolic heart failure as a result of morbid obesity - Overall doing quite well.  Continue current regimen.  No changes made. -Continue with Lasix 40 mg twice a day.  Creatinine 0.9.  Echocardiogram once again reviewed with her.  Morbid obesity - Agree that this remains the crux of many of her issues.  Continue to encourage weight loss, decrease caloric intake.  Great job with 17 pound weight loss.  Continue.  Obstructive sleep apnea -On CPAP.  No changes.  Abnormal chest CT, pulmonary nodule - Followed by pulmonary.  Probably benign.  Fatty liver disease noted on CT - Primary care can continue to follow.  Chest wall rash -She notices blisters on the left upper chest wall that sometimes come and go.  I have asked her to address this with her primary doctor.  They are not painful.  Hopefully not shingles.  6 months with Theresa Kirby, 12 months with me  Medication Adjustments/Labs and Tests Ordered: Current medicines are reviewed at length with the patient today.  Concerns regarding medicines are outlined above.  Orders Placed This Encounter  Procedures  . EKG 12-Lead   No orders of the defined types were placed in this encounter.   Signed, Candee Furbish, MD  04/13/2018 1:29 PM    [facility]

## 2018-05-15 ENCOUNTER — Ambulatory Visit (INDEPENDENT_AMBULATORY_CARE_PROVIDER_SITE_OTHER)
Admission: RE | Admit: 2018-05-15 | Discharge: 2018-05-15 | Disposition: A | Discharge status: Home / Self Care | Payer: Medicaid Other | Source: Ambulatory Visit | Attending: Pulmonary Disease | Admitting: Pulmonary Disease

## 2018-05-15 DIAGNOSIS — R911 Solitary pulmonary nodule: Secondary | ICD-10-CM | POA: Diagnosis not present

## 2018-05-15 MED ORDER — IOPAMIDOL (ISOVUE-300) INJECTION 61%
80.0000 mL | Freq: Once | INTRAVENOUS | Status: AC | PRN
  Administered 2018-05-15: 80 mL via INTRAVENOUS

## 2018-05-16 NOTE — Progress Notes (Signed)
LMTCB

## 2018-05-21 ENCOUNTER — Telehealth: Payer: Self-pay | Admitting: Pulmonary Disease

## 2018-05-21 DIAGNOSIS — R911 Solitary pulmonary nodule: Secondary | ICD-10-CM

## 2018-05-21 NOTE — Telephone Encounter (Signed)
-----   Message from Rigoberto Noel, MD sent at 05/15/2018  6:16 PM EDT ----- Stable nodule right lower lobe. 1 year follow-up CT recommended

## 2018-05-31 ENCOUNTER — Ambulatory Visit: Payer: Medicaid Other | Admitting: Podiatry

## 2018-05-31 DIAGNOSIS — L600 Ingrowing nail: Secondary | ICD-10-CM

## 2018-05-31 MED ORDER — NEOMYCIN-POLYMYXIN-HC 3.5-10000-1 OT SOLN: OTIC | 0 refills | Status: DC

## 2018-05-31 NOTE — Patient Instructions (Signed)

## 2018-06-14 ENCOUNTER — Other Ambulatory Visit: Payer: Self-pay

## 2018-06-20 ENCOUNTER — Ambulatory Visit (INDEPENDENT_AMBULATORY_CARE_PROVIDER_SITE_OTHER): Payer: Self-pay

## 2018-06-20 DIAGNOSIS — L600 Ingrowing nail: Secondary | ICD-10-CM

## 2018-06-20 NOTE — Patient Instructions (Signed)

## 2018-06-25 NOTE — Progress Notes (Signed)
Patient is here today for follow-up appointment, recent procedure performed 05/31/2018, removal of ingrown nail borders.  She states that overall the area is healing well she is any problems at this time.  No redness, no erythema, no swelling, no drainage, no other signs and symptoms of infection.  Area is scabbed over and healing well.  Discussed signs and symptoms of infection.  Verbal and written instructions were given to the patient.  She is to follow-up with any acute symptom changes as needed.

## 2018-08-19 NOTE — Progress Notes (Signed)
Subjective:  Patient ID: Theresa Kirby, female    DOB: May 21, 1956,  MRN: [id number]  Chief Complaint  Patient presents with  . Foot Pain    tarsal tunnel right    63 y.o. female presents with the above complaint.  Tarsal tunnel is doing much better concern of right great toe ingrown toenail today   Review of Systems: Negative except as noted in the HPI. Denies N/V/F/Ch.  Past Medical History:  Diagnosis Date  . Arthritis    knees  . Asthma   . Iron deficiency anemia   . Morbid obesity ([place])   . Rhabdomyolysis 04/2016   normal compartment pressures  . Sleep apnea    uses cpap, setting is unknown, does not wear it consistently  . Thyroid disease     Current Outpatient Medications:  .  acetaminophen (TYLENOL) 325 MG tablet, Take 650 mg by mouth every 6 (six) hours as needed (pain). , Disp: , Rfl:  .  albuterol (PROAIR HFA) 108 (90 BASE) MCG/ACT inhaler, Inhale 2 puffs into the lungs every 6 (six) hours as needed for wheezing or shortness of breath (rescue). , Disp: , Rfl:  .  aspirin EC 81 MG EC tablet, Take 1 tablet (81 mg total) by mouth daily., Disp: , Rfl:  .  diclofenac sodium (VOLTAREN) 1 % GEL, Apply 2 g topically 2 (two) times daily as needed (pain). Applies to knees., Disp: , Rfl:  .  ferrous sulfate 325 (65 FE) MG tablet, Take 325 mg by mouth 3 (three) times daily with meals., Disp: , Rfl:  .  Fluticasone-Salmeterol (ADVAIR) 250-50 MCG/DOSE AEPB, Inhale 1 puff into the lungs 2 (two) times daily., Disp: , Rfl:  .  furosemide (LASIX) 80 MG tablet, Take 0.5 tablets (40 mg total) by mouth 2 (two) times daily., Disp: 90 tablet, Rfl: 3 .  metFORMIN (GLUCOPHAGE-XR) 500 MG 24 hr tablet, TAKE 1 Tablet BY MOUTH EVERY DAY WITH EVENING MEAL, Disp: , Rfl: 3 .  montelukast (SINGULAIR) 10 MG tablet, Take 10 mg by mouth at bedtime as needed (allergies.). , Disp: , Rfl:  .  Multiple Vitamin (MULTIVITAMIN) capsule, Take 1 capsule by mouth daily., Disp: , Rfl:  .   neomycin-polymyxin-hydrocortisone (CORTISPORIN) OTIC solution, Apply 2 drops to the ingrown toenail site twice daily. Cover with band-aid., Disp: 10 mL, Rfl: 0 .  Omega-3 Fatty Acids (FISH OIL PO), Take 1 capsule by mouth daily., Disp: , Rfl:  .  potassium chloride SA (K-DUR,KLOR-CON) 20 MEQ tablet, Take 1 tablet (20 mEq total) by mouth 2 (two) times daily., Disp: 180 tablet, Rfl: 3  Social History   Tobacco Use  Smoking Status Never Smoker  Smokeless Tobacco Never Used    Allergies  Allergen Reactions  . Sulfa Antibiotics Hives and Shortness Of Breath  . Wilder Glade [Dapagliflozin]     Blisters   Objective:  There were no vitals filed for this visit. There is no height or weight on file to calculate BMI. Constitutional Well developed. Well nourished.  Vascular Dorsalis pedis pulses palpable bilaterally. Posterior tibial pulses palpable bilaterally. Capillary refill normal to all digits.  No cyanosis or clubbing noted. Pedal hair growth normal.  Neurologic Normal speech. Oriented to person, place, and time. Epicritic sensation to light touch grossly present bilaterally.  Dermatologic Painful ingrowing nail at medial nail borders of the hallux nail right. No other open wounds. No skin lesions.  Orthopedic: Normal joint ROM without pain or crepitus bilaterally. No visible deformities. No bony tenderness.  Radiographs: None Assessment:   1. Ingrown nail    Plan:  Patient was evaluated and treated and all questions answered.  Ingrown Nail, right -Patient elects to proceed with minor surgery to remove ingrown toenail removal today. Consent reviewed and signed by patient. -Ingrown nail excised. See procedure note. -Educated on post-procedure care including soaking. Written instructions provided and reviewed. -Patient to follow up in 2 weeks for nail check.  Procedure: Excision of Ingrown Toenail Location: Right 1st medial nail borders. Anesthesia: Lidocaine 1% plain; 1.5  mL and Marcaine 0.5% plain; 1.5 mL, digital block. Skin Prep: Betadine. Dressing: Silvadene; telfa; dry, sterile, compression dressing. Technique: Following skin prep, the toe was exsanguinated and a tourniquet was secured at the base of the toe. The affected nail border was freed, split with a nail splitter, and excised. Chemical matrixectomy was then performed with phenol and irrigated out with alcohol. The tourniquet was then removed and sterile dressing applied. Disposition: Patient tolerated procedure well. Patient to return in 2 weeks for follow-up.   Return in about 2 weeks (around 06/14/2018) for Nail check .

## 2018-11-28 ENCOUNTER — Other Ambulatory Visit: Payer: Self-pay | Admitting: Nurse Practitioner

## 2018-11-28 MED ORDER — FUROSEMIDE 80 MG PO TABS: 40.0000 mg | ORAL_TABLET | Freq: Two times a day (BID) | ORAL | 1 refills | Status: DC

## 2019-02-01 ENCOUNTER — Other Ambulatory Visit: Payer: Self-pay | Admitting: Family Medicine

## 2019-02-01 DIAGNOSIS — Z1231 Encounter for screening mammogram for malignant neoplasm of breast: Secondary | ICD-10-CM

## 2019-03-12 ENCOUNTER — Encounter: Payer: Self-pay | Admitting: Cardiology

## 2019-03-12 ENCOUNTER — Ambulatory Visit (INDEPENDENT_AMBULATORY_CARE_PROVIDER_SITE_OTHER): Payer: Medicaid Other | Admitting: Cardiology

## 2019-03-12 ENCOUNTER — Other Ambulatory Visit: Payer: Self-pay

## 2019-03-12 VITALS — BP 130/80 | HR 78 | Ht 64.0 in | Wt 327.0 lb

## 2019-03-12 DIAGNOSIS — R911 Solitary pulmonary nodule: Secondary | ICD-10-CM | POA: Diagnosis not present

## 2019-03-12 DIAGNOSIS — I5032 Chronic diastolic (congestive) heart failure: Secondary | ICD-10-CM | POA: Diagnosis not present

## 2019-03-12 NOTE — Patient Instructions (Signed)
Medication Instructions:  The current medical regimen is effective;  continue present plan and medications.  If you need a refill on your cardiac medications before your next appointment, please call your pharmacy.   Follow-Up: At [facility], you and your health needs are our priority.  As part of our continuing mission to provide you with exceptional heart care, we have created designated Provider Care Teams.  These Care Teams include your primary Cardiologist (physician) and Advanced Practice Providers (APPs -  Physician Assistants and Nurse Practitioners) who all work together to provide you with the care you need, when you need it. You will need a follow up appointment in 12 months.  Please call our office 2 months in advance to schedule this appointment.  You may see Mark Skains, MD or one of the following Advanced Practice Providers on your designated Care Team:   Lori Gerhardt, NP Laura Ingold, NP . Jill McDaniel, NP  Thank you for choosing [facility]!!      

## 2019-03-12 NOTE — Progress Notes (Signed)
Cardiology Office Note:    Date:  03/12/2019   ID:  [name], DOB 06-May-1956, MRN [id number]  PCP:  [facility]  Cardiologist:  Candee Furbish, MD  Electrophysiologist:  None   Referring MD: [facility]   Here for follow-up of shortness of breath  History of Present Illness:    Theresa Kirby is a 63 y.o. female with prior 12 L diuresis during hospitalization in 2018 here for follow-up.  Previously saw Truitt Merle.  Workmen's Comp. previously for foot.  No chest pain, no syncope.  Mask challenges her. No CP.   Past Medical History:  Diagnosis Date   Arthritis    knees   Asthma    Iron deficiency anemia    Morbid obesity ([place])    Rhabdomyolysis 04/2016   normal compartment pressures   Sleep apnea    uses cpap, setting is unknown, does not wear it consistently   Thyroid disease     Past Surgical History:  Procedure Laterality Date   CESAREAN SECTION     COLONOSCOPY WITH PROPOFOL N/A 07/21/2015   Procedure: COLONOSCOPY WITH PROPOFOL;  Surgeon: Juanita Craver, MD;  Location: [facility];  Service: Endoscopy;  Laterality: N/A;   DILATION AND CURETTAGE OF UTERUS     ENDOMETRIAL ABLATION     GANGLION CYST EXCISION     rt arm   SHOULDER SURGERY Left    Rotator cuff repair    Current Medications: Current Meds  Medication Sig   acetaminophen (TYLENOL) 325 MG tablet Take 650 mg by mouth every 6 (six) hours as needed (pain).    albuterol (PROAIR HFA) 108 (90 BASE) MCG/ACT inhaler Inhale 2 puffs into the lungs every 6 (six) hours as needed for wheezing or shortness of breath (rescue).    aspirin EC 81 MG EC tablet Take 1 tablet (81 mg total) by mouth daily.   diclofenac sodium (VOLTAREN) 1 % GEL Apply 2 g topically 2 (two) times daily as needed (pain). Applies to knees.   ferrous sulfate 325 (65 FE) MG tablet Take 325 mg by mouth 3 (three) times daily with meals.   Fluticasone-Salmeterol (ADVAIR) 250-50 MCG/DOSE  AEPB Inhale 1 puff into the lungs 2 (two) times daily.   furosemide (LASIX) 80 MG tablet Take 0.5 tablets (40 mg total) by mouth 2 (two) times daily.   levothyroxine (SYNTHROID) 50 MCG tablet Take 50 mcg by mouth daily.   metFORMIN (GLUCOPHAGE-XR) 500 MG 24 hr tablet TAKE 1 Tablet BY MOUTH EVERY DAY WITH EVENING MEAL   montelukast (SINGULAIR) 10 MG tablet Take 10 mg by mouth at bedtime as needed (allergies.).    Multiple Vitamin (MULTIVITAMIN) capsule Take 1 capsule by mouth daily.   neomycin-polymyxin-hydrocortisone (CORTISPORIN) OTIC solution Apply 2 drops to the ingrown toenail site twice daily. Cover with band-aid.   Omega-3 Fatty Acids (FISH OIL PO) Take 1 capsule by mouth daily.   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  Social Needs   Financial resource strain: Not on [employer]  insecurity    Worry: Not on file    Inability: Not on file   Transportation needs    Medical: Not on file    Non-medical: Not on file  Tobacco Use   Smoking status: Never Smoker   Smokeless tobacco: Never Used  Substance and Sexual Activity   Alcohol use: No   Drug use: No   Sexual activity: Not Currently    Birth control/protection: None  Lifestyle   Physical activity    Days per week: Not on file    Minutes per session: Not on file   Stress: Not on file  Relationships   Social connections    Talks on phone: Not on file    Gets together: Not on file    Attends religious service: Not on file    Active member of club or organization: Not on file    Attends meetings of clubs or organizations:  Not on file    Relationship status: Not on file  Other Topics Concern   Not on file  Social History Narrative   Not on file     Family History: The patient's family history includes Heart disease in her maternal grandmother and mother; Heart failure in her mother; Hypertension in her brother and maternal grandmother.  ROS:   Please see the history of present illness.    No fevers chills nausea vomiting syncope all other systems reviewed and are negative.  EKGs/Labs/Other Studies Reviewed:    The following studies were reviewed today: Echo Study Conclusions 02/2017  - Left ventricle: The cavity size was normal. Wall thickness was normal. Systolic function was normal. The estimated ejection fraction was in the range of 60% to 65%. Although no diagnostic regional wall motion abnormality was identified, this possibility cannot be completely excluded on the basis of this study. Features are consistent with a pseudonormal left ventricular filling pattern, with concomitant abnormal relaxation and increased filling pressure (grade 2 diastolic dysfunction). - Ventricular septum: Mildly D-shaped interventricular septum suggestive of RV pressure/volume overload. - Aortic valve: There was no stenosis. - Mitral valve: There was no significant regurgitation. - Right ventricle: The cavity size was mildly dilated. Systolic function was normal. - Right atrium: The atrium was mildly dilated. - Pulmonary arteries: No complete TR doppler jet so unable to estimate PA systolic pressure. - Systemic veins: IVC measured 2.0 cm with < 50% respirophasic variation, suggesting RA pressure 8 mmHg.  Impressions:  - Normal LV size with EF 60-65%. Moderate diastolic dysfunction. Mildly dilated RV with normal systolic function. Mildly D-shaped interventricular septum suggesting RV pressure/volume overload.  CT CHEST IMPRESSION 04/2016: 1. Image quality is rather degraded  by body habitus and respiratory motion. 2. 1.4 cm right lower lobe nodule. Consider one of the following in 3 months for both low-risk and high-risk individuals: (a) repeat chest CT, (b) follow-up PET-CT, or (c) tissue sampling. This recommendation follows the consensus statement: Guidelines for Management of Incidental Pulmonary Nodules Detected on CT Images: From the Fleischner Society 2017; Radiology 2017; 284:228-243. 3. Aortic atherosclerosis (ICD10-170.0). 4. Enlarged pulmonary arteries, indicative of pulmonary arterial hypertension.   Electronically Signed By: Lorin Picket M.D. On: 05/07/2016 12:03  EKG:  EKG is  ordered today.  The ekg ordered today demonstrates sinus rhythm 78 with PAC noted.  Recent Labs: No results found for requested labs within last 8760 hours.  Recent Lipid Panel    Component Value Date/Time   CHOL 151 03/14/2017 0427   TRIG 107 03/14/2017 0427   HDL  40 (L) 03/14/2017 0427   CHOLHDL 3.8 03/14/2017 0427   VLDL 21 03/14/2017 0427   LDLCALC 90 03/14/2017 0427    Physical Exam:    VS:  BP 130/80    Pulse 78    Ht 5\' 4"  (1.626 m)    Wt (!) 327 lb (148.3 kg)    LMP 05/12/2012    SpO2 92%    BMI 56.13 kg/m     Wt Readings from Last 3 Encounters:  03/12/19 (!) 327 lb (148.3 kg)  04/13/18 (!) 310 lb 12.8 oz (141 kg)  11/29/17 (!) 324 lb (147 kg)     GEN:  Well nourished, well developed in no acute distress HEENT: Normal NECK: No JVD; No carotid bruits LYMPHATICS: No lymphadenopathy CARDIAC: RRR, no murmurs, rubs, gallops RESPIRATORY:  Clear to auscultation without rales, wheezing or rhonchi  ABDOMEN: Soft, non-tender, non-distended, morbidly obese MUSCULOSKELETAL:  No edema; No deformity  SKIN: Warm and dry NEUROLOGIC:  Alert and oriented x 3 PSYCHIATRIC:  Normal affect   ASSESSMENT:    1. Chronic diastolic heart failure ([place])   2. Morbid obesity ([place])   3. Pulmonary nodule    PLAN:    In order of problems listed  above:  Chronic diastolic heart failure as result of morbid obesity - Continue to encourage weight loss.  Lasix 40 mg twice a day.  Echocardiogram with normal pump function. Nutrition classes.   Obstructive sleep apnea -Continue with CPAP  Fatty liver disease noted on CT - Continue to monitor, weight loss PCP aware.  Pulmonary nodule - CT scan was repeated on 05/15/2018, slightly larger.  Monitor once a year.  Medication Adjustments/Labs and Tests Ordered: Current medicines are reviewed at length with the patient today.  Concerns regarding medicines are outlined above.  Orders Placed This Encounter  Procedures   EKG 12-Lead   No orders of the defined types were placed in this encounter.   Patient Instructions  Medication Instructions:  The current medical regimen is effective;  continue present plan and medications.  If you need a refill on your cardiac medications before your next appointment, please call your pharmacy.   Follow-Up: At [facility], you and your health needs are our priority.  As part of our continuing mission to provide you with exceptional heart care, we have created designated Provider Care Teams.  These Care Teams include your primary Cardiologist (physician) and Advanced Practice Providers (APPs -  Physician Assistants and Nurse Practitioners) who all work together to provide you with the care you need, when you need it. You will need a follow up appointment in 12 months.  Please call our office 2 months in advance to schedule this appointment.  You may see Candee Furbish, MD or one of the following Advanced Practice Providers on your designated Care Team:   Truitt Merle, NP Cecilie Kicks, NP  Kathyrn Drown, NP  Thank you for choosing [facility]!!         Signed, Candee Furbish, MD  03/12/2019 3:28 PM    [place]

## 2019-03-20 ENCOUNTER — Other Ambulatory Visit: Payer: Self-pay | Admitting: Family Medicine

## 2019-03-20 DIAGNOSIS — D352 Benign neoplasm of pituitary gland: Secondary | ICD-10-CM

## 2019-03-21 ENCOUNTER — Other Ambulatory Visit: Payer: Self-pay

## 2019-03-21 ENCOUNTER — Ambulatory Visit
Admission: RE | Admit: 2019-03-21 | Discharge: 2019-03-21 | Disposition: A | Discharge status: Home / Self Care | Payer: Medicaid Other | Source: Ambulatory Visit | Attending: Family Medicine | Admitting: Family Medicine

## 2019-03-21 DIAGNOSIS — Z1231 Encounter for screening mammogram for malignant neoplasm of breast: Secondary | ICD-10-CM

## 2019-03-28 ENCOUNTER — Other Ambulatory Visit: Payer: Self-pay | Admitting: Pulmonary Disease

## 2019-03-28 DIAGNOSIS — R911 Solitary pulmonary nodule: Secondary | ICD-10-CM

## 2019-03-28 NOTE — Addendum Note (Signed)
Addended by: Collier Salina on: 03/28/2019 11:07 AM   Modules accepted: Orders

## 2019-04-18 ENCOUNTER — Other Ambulatory Visit: Payer: Self-pay

## 2019-04-18 ENCOUNTER — Ambulatory Visit
Admission: RE | Admit: 2019-04-18 | Discharge: 2019-04-18 | Disposition: A | Discharge status: Home / Self Care | Payer: Medicaid Other | Source: Ambulatory Visit | Attending: Family Medicine | Admitting: Family Medicine

## 2019-04-18 DIAGNOSIS — D352 Benign neoplasm of pituitary gland: Secondary | ICD-10-CM

## 2019-04-18 MED ORDER — GADOBENATE DIMEGLUMINE 529 MG/ML IV SOLN
10.0000 mL | Freq: Once | INTRAVENOUS | Status: AC | PRN
  Administered 2019-04-18: 10:00:00 10 mL via INTRAVENOUS

## 2019-05-07 ENCOUNTER — Telehealth: Payer: Self-pay | Admitting: Pulmonary Disease

## 2019-05-07 DIAGNOSIS — R911 Solitary pulmonary nodule: Secondary | ICD-10-CM

## 2019-05-07 NOTE — Telephone Encounter (Signed)
I had ordered 1 yr Ct chest with contrast to FU  nodule & LN Apparently this was denied & PET has been suggested by insurance OK to order PET scan instead

## 2019-05-07 NOTE — Telephone Encounter (Signed)
Order has been placed for PET scan.

## 2019-05-15 ENCOUNTER — Encounter (HOSPITAL_COMMUNITY): Admission: RE | Admit: 2019-05-15 | Payer: Medicaid Other | Source: Ambulatory Visit

## 2019-05-22 ENCOUNTER — Inpatient Hospital Stay: Admission: RE | Admit: 2019-05-22 | Payer: Medicaid Other | Source: Ambulatory Visit

## 2019-06-13 ENCOUNTER — Other Ambulatory Visit: Payer: Self-pay

## 2019-06-13 ENCOUNTER — Ambulatory Visit (HOSPITAL_COMMUNITY)
Admission: RE | Admit: 2019-06-13 | Discharge: 2019-06-13 | Disposition: A | Discharge status: Home / Self Care | Payer: Medicaid Other | Source: Ambulatory Visit | Attending: Pulmonary Disease | Admitting: Pulmonary Disease

## 2019-06-13 DIAGNOSIS — R911 Solitary pulmonary nodule: Secondary | ICD-10-CM | POA: Diagnosis not present

## 2019-06-13 LAB — GLUCOSE, CAPILLARY: Glucose-Capillary: 192 mg/dL — ABNORMAL HIGH (ref 70–99)

## 2019-06-13 MED ORDER — FLUDEOXYGLUCOSE F - 18 (FDG) INJECTION
14.6000 | Freq: Once | INTRAVENOUS | Status: AC
  Administered 2019-06-13: 14.6 via INTRAVENOUS

## 2019-06-19 NOTE — Progress Notes (Signed)
Spoke with pt and notified of results per Dr. Alva. Pt verbalized understanding and denied any questions. 

## 2019-06-24 ENCOUNTER — Ambulatory Visit (INDEPENDENT_AMBULATORY_CARE_PROVIDER_SITE_OTHER): Payer: Medicaid Other | Admitting: Primary Care

## 2019-06-24 ENCOUNTER — Other Ambulatory Visit: Payer: Self-pay

## 2019-06-24 DIAGNOSIS — R911 Solitary pulmonary nodule: Secondary | ICD-10-CM | POA: Diagnosis not present

## 2019-06-24 NOTE — Progress Notes (Signed)
Virtual Visit via Telephone Note  I connected with Theresa Kirby on 06/24/19 at  1:30 PM EST by telephone and verified that I am speaking with the correct person using two identifiers.  Location: Patient: Home Provider: Office   I discussed the limitations, risks, security and privacy concerns of performing an evaluation and management service by telephone and the availability of in person appointments. I also discussed with the patient that there may be a patient responsible charge related to this service. The patient expressed understanding and agreed to proceed.   History of Present Illness: 63 year old female, never smoked. PMH significant for solitary pulmonary nodule, asthma, OSA (on CPAP), diastolic heart failure. Patient of Dr. Elsworth Soho, last seen on 11/29/17. Incidental 1.3cm right lower lobe nodule found on CTA in October 2017. Maintained on Advair 250.   06/24/2019 Patient contacted today to review PET scan. PET scan on 11/19 showed no hypermetabolic activity associated with RIGHT lobe pulmonary etiology favoring benign etiology. No change in size in 1 year. Ace level previously normal. Patient is low risk, never smoked. Denies shortness of breath or hemoptysis. No further follow-up needed   Observations/Objective:  - No shortness of breath, wheezing or cough noted during phone conversation   Significant tests/ events reviewed  CT angiogram 04/2016 showed 1.3 cm nodule in the right lower lobe but was not very well characterized due to motion artifact. There was also enlarged mediastinal and peribronchial lymph nodes measuring up to 1.7 cm.  CT chest with contrast  03/2017 which showed stable nodule and enlarged right paratracheal and left hilar lymph nodes as prior.   Chest x-rays from 2010 and 2013 showed left hilar fullness but has been attributed to vascular congestion and prominent pulmonary arteries  ACE 19  Assessment and Plan:  Solitary pulmonary nodule: -  Never smoked, low risk - PET scan 06/13/19 showed right lower lobe pulmonary nodules measuring 1.3cm with no hypermetabolic activity and no change in size in 1 year - Consistent with benign etiology-no further follow up needed  Asthma: - Stable, continues Advair 250 one puff twice daily  Follow Up Instructions:   - As needed only   I discussed the assessment and treatment plan with the patient. The patient was provided an opportunity to ask questions and all were answered. The patient agreed with the plan and demonstrated an understanding of the instructions.   The patient was advised to call back or seek an in-person evaluation if the symptoms worsen or if the condition fails to improve as anticipated.  I provided 15 minutes of non-face-to-face time during this encounter.   Martyn Ehrich, NP

## 2019-06-30 ENCOUNTER — Other Ambulatory Visit: Payer: Self-pay | Admitting: Nurse Practitioner

## 2019-07-03 NOTE — Progress Notes (Signed)
Can you please order CT chest wo contrast Jan 2022 and with follow-up Dr. Elsworth Soho after

## 2019-07-05 ENCOUNTER — Other Ambulatory Visit: Payer: Self-pay | Admitting: Primary Care

## 2019-07-05 DIAGNOSIS — R911 Solitary pulmonary nodule: Secondary | ICD-10-CM

## 2019-07-12 ENCOUNTER — Other Ambulatory Visit: Payer: Self-pay | Admitting: Pulmonary Disease

## 2019-07-12 NOTE — Progress Notes (Signed)
Error

## 2019-08-09 ENCOUNTER — Other Ambulatory Visit: Payer: Self-pay | Admitting: Podiatry

## 2019-08-09 ENCOUNTER — Ambulatory Visit: Payer: Medicaid Other | Admitting: Podiatry

## 2019-08-09 ENCOUNTER — Ambulatory Visit (INDEPENDENT_AMBULATORY_CARE_PROVIDER_SITE_OTHER): Payer: Medicaid Other

## 2019-08-09 ENCOUNTER — Other Ambulatory Visit: Payer: Self-pay

## 2019-08-09 DIAGNOSIS — M109 Gout, unspecified: Secondary | ICD-10-CM

## 2019-08-09 DIAGNOSIS — M79674 Pain in right toe(s): Secondary | ICD-10-CM

## 2019-08-09 DIAGNOSIS — M7989 Other specified soft tissue disorders: Secondary | ICD-10-CM

## 2019-08-09 DIAGNOSIS — M7751 Other enthesopathy of right foot: Secondary | ICD-10-CM | POA: Diagnosis not present

## 2019-08-09 DIAGNOSIS — M778 Other enthesopathies, not elsewhere classified: Secondary | ICD-10-CM

## 2019-08-09 DIAGNOSIS — M79671 Pain in right foot: Secondary | ICD-10-CM

## 2019-08-09 DIAGNOSIS — M2011 Hallux valgus (acquired), right foot: Secondary | ICD-10-CM

## 2019-08-09 MED ORDER — COLCHICINE 0.6 MG PO TABS: 0.6000 mg | ORAL_TABLET | Freq: Every day | ORAL | 0 refills | Status: DC | PRN

## 2019-08-09 NOTE — Addendum Note (Signed)
Addended by: Dorris Singh on: 08/09/2019 03:53 PM   Modules accepted: Orders

## 2019-08-09 NOTE — Progress Notes (Signed)
  Subjective:  Patient ID: Theresa Kirby, female    DOB: Jan 25, 1956,  MRN: [id number]  Chief Complaint  Patient presents with  . Foot Pain    Right medial foot pain proximal 1st. Pt states pain is extreme, has swelling and redness. 3 day duration, no known injury. Painful when walking and at rest.    64 y.o. female presents with the above complaint. History confirmed with patient.   Objective:  Physical Exam: warm, good capillary refill, no trophic changes or ulcerative lesions, normal DP and PT pulses and normal sensory exam.  Right Foot: soft tissue swelling noted over the right 1st MPJ, bunion deformity noted and tenderness of the 1st metatarsal head   No images are attached to the encounter.  Radiographs: X-ray of the right foot: soft tissue swelling and shows DJD changes, likely chronic, hallux valgus deformity.  Assessment:   1. Acute gout involving toe of right foot, unspecified cause   2. Capsulitis of foot, right   3. Hallux valgus, right   4. Pain and swelling of toe of right foot      Plan:  Patient was evaluated and treated and all questions answered.  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 -Rx colchicine. Discussed side effects of the medication.  Procedure: Joint Injection Location: Right 1st MPJ joint Skin Prep: Alcohol. Injectate: 0.5 cc 1% lidocaine plain, 0.5 cc dexamethasone phosphate. After 3ccs 50/50 1% lidocaine plain, 0.5% marcaine plain Disposition: Patient tolerated procedure well. Injection site dressed with a band-aid.   Return in about 3 weeks (around 08/30/2019).

## 2019-08-10 LAB — SYNOVIAL FLUID, CRYSTAL

## 2019-08-12 ENCOUNTER — Other Ambulatory Visit: Payer: Self-pay | Admitting: Podiatry

## 2019-08-12 ENCOUNTER — Telehealth: Payer: Self-pay | Admitting: Podiatry

## 2019-08-12 LAB — URIC ACID: Uric Acid, Serum: 10.6 mg/dL — ABNORMAL HIGH (ref 2.5–7.0)

## 2019-08-12 MED ORDER — METHYLPREDNISOLONE 4 MG PO TBPK: ORAL_TABLET | ORAL | 0 refills | Status: DC

## 2019-08-12 NOTE — Telephone Encounter (Signed)
Pt went to pick up her prescription from the pharmacy but was told they were waiting on authorization from medicaid. Pt calling to follow up.

## 2019-08-12 NOTE — Progress Notes (Signed)
Rx Medrol as pt's insurance would not cover Colchicine.

## 2019-08-13 NOTE — Telephone Encounter (Signed)
Left message for pt to call with more information concerning the medication.

## 2019-08-13 NOTE — Telephone Encounter (Signed)
Pt states she is returning a call  

## 2019-08-13 NOTE — Telephone Encounter (Signed)
Left message requesting the name of the medication needing pre-cert.

## 2019-08-14 NOTE — Telephone Encounter (Signed)
Dr. March Rummage states he sent steroid pack and the colchicine pre-cert is not necessary.

## 2019-08-30 ENCOUNTER — Ambulatory Visit: Payer: Medicaid Other | Admitting: Podiatry

## 2019-08-30 ENCOUNTER — Other Ambulatory Visit: Payer: Self-pay

## 2019-08-30 DIAGNOSIS — M2011 Hallux valgus (acquired), right foot: Secondary | ICD-10-CM | POA: Diagnosis not present

## 2019-08-30 DIAGNOSIS — M109 Gout, unspecified: Secondary | ICD-10-CM

## 2019-08-30 DIAGNOSIS — M778 Other enthesopathies, not elsewhere classified: Secondary | ICD-10-CM

## 2019-08-30 NOTE — Progress Notes (Signed)
  Subjective:  Patient ID: Theresa Kirby, female    DOB: 1956/01/14,  MRN: [id number]  Chief Complaint  Patient presents with  . Gout    Pt states her pain is greatly resolved. Denies any concerns.   64 y.o. female presents with the above complaint. History confirmed with patient. States the foot feels much better and is back to normal.  Objective:  Physical Exam: warm, good capillary refill, no trophic changes or ulcerative lesions, normal DP and PT pulses and normal sensory exam.  Right Foot: normal exam, no swelling, tenderness, instability; ligaments intact, full range of motion of all ankle/foot joints   Results for SMERA, MAMO (MRN [id number]) as of 08/30/2019 10:23  Ref. Range 08/12/2019 14:50  Uric Acid, Serum Latest Ref Range: 2.5 - 7.0 mg/dL 10.6 (H)   Crystals, Fluid Synovial fluid, crystal Collected: 08/09/19 1552  Result status: Final  Resulting lab: [facility]  Reference range: NONE SEEN /HPF  Comment: .  Positive for monosodium urates  .  Intracellular  Extracellular    Assessment:   1. Acute gout involving toe of right foot, unspecified cause   2. Capsulitis of foot, right   3. Hallux valgus, right     Plan:  Patient was evaluated and treated and all questions answered.  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 Recommended PCP f/u for chronic management. -Reviewed results of joint aspirate -Discussed methods to avoid recurrence -No pain today warranting repeat injection -F/u PRN.  No follow-ups on file.

## 2019-10-08 ENCOUNTER — Other Ambulatory Visit: Payer: Self-pay | Admitting: Nurse Practitioner

## 2019-12-09 ENCOUNTER — Telehealth: Payer: Self-pay | Admitting: Podiatry

## 2019-12-09 MED ORDER — COLCHICINE 0.6 MG PO TABS: 0.6000 mg | ORAL_TABLET | Freq: Every day | ORAL | 0 refills | Status: DC | PRN

## 2019-12-09 NOTE — Addendum Note (Signed)
Addended by: Harriett Sine D on: 12/09/2019 12:02 PM   Modules accepted: Orders

## 2019-12-09 NOTE — Telephone Encounter (Signed)
Pt called and would like a refill on gout medication please advise

## 2019-12-16 ENCOUNTER — Other Ambulatory Visit (HOSPITAL_COMMUNITY)
Admission: RE | Admit: 2019-12-16 | Discharge: 2019-12-16 | Disposition: A | Discharge status: Home / Self Care | Payer: Medicaid Other | Source: Ambulatory Visit | Attending: Obstetrics & Gynecology | Admitting: Obstetrics & Gynecology

## 2019-12-16 ENCOUNTER — Encounter: Payer: Self-pay | Admitting: Obstetrics & Gynecology

## 2019-12-16 ENCOUNTER — Ambulatory Visit (INDEPENDENT_AMBULATORY_CARE_PROVIDER_SITE_OTHER): Payer: Medicaid Other | Admitting: Obstetrics & Gynecology

## 2019-12-16 ENCOUNTER — Other Ambulatory Visit: Payer: Self-pay

## 2019-12-16 VITALS — BP 143/80 | HR 72 | Ht 64.0 in | Wt 325.2 lb

## 2019-12-16 DIAGNOSIS — Z803 Family history of malignant neoplasm of breast: Secondary | ICD-10-CM | POA: Diagnosis not present

## 2019-12-16 DIAGNOSIS — D259 Leiomyoma of uterus, unspecified: Secondary | ICD-10-CM | POA: Diagnosis not present

## 2019-12-16 DIAGNOSIS — Z01419 Encounter for gynecological examination (general) (routine) without abnormal findings: Secondary | ICD-10-CM | POA: Insufficient documentation

## 2019-12-16 DIAGNOSIS — Z1272 Encounter for screening for malignant neoplasm of vagina: Secondary | ICD-10-CM | POA: Diagnosis not present

## 2019-12-16 DIAGNOSIS — D219 Benign neoplasm of connective and other soft tissue, unspecified: Secondary | ICD-10-CM

## 2019-12-16 DIAGNOSIS — Z0000 Encounter for general adult medical examination without abnormal findings: Secondary | ICD-10-CM

## 2019-12-16 NOTE — Patient Instructions (Signed)

## 2019-12-16 NOTE — Progress Notes (Signed)
GYNECOLOGY ANNUAL PREVENTATIVE CARE ENCOUNTER NOTE  History:     Theresa Kirby is a 64 y.o. PMP female here for a routine annual gynecologic exam and to establish care.  Current complaints: none.  Recent imaging showed she still had fibroids; had these for years, no symptoms.  Last pap was in 2010.  Not sexually active. Denies abnormal vaginal bleeding, discharge, pelvic pain, or other gynecologic concerns.    Gynecologic History Patient's last menstrual period was 05/12/2012. Contraception: post menopausal status Last Pap: 2010. Results were: normal Last mammogram: 03/21/2019. Results were: normal   Past Medical History:  Diagnosis Date  . Arthritis    knees  . Asthma   . Iron deficiency anemia   . Morbid obesity ([place])   . Rhabdomyolysis 04/2016   normal compartment pressures  . Sleep apnea    uses cpap, setting is unknown, does not wear it consistently  . Thyroid disease     Past Surgical History:  Procedure Laterality Date  . CESAREAN SECTION    . COLONOSCOPY WITH PROPOFOL N/A 07/21/2015   Procedure: COLONOSCOPY WITH PROPOFOL;  Surgeon: Juanita Craver, MD;  Location: [facility];  Service: Endoscopy;  Laterality: N/A;  . DILATION AND CURETTAGE OF UTERUS    . ENDOMETRIAL ABLATION    . GANGLION CYST EXCISION     rt arm  . SHOULDER SURGERY Left    Rotator cuff repair    Current Outpatient Medications on File Prior to Visit  Medication Sig Dispense Refill  . acetaminophen (TYLENOL) 325 MG tablet Take 650 mg by mouth every 6 (six) hours as needed (pain).     Marland Kitchen albuterol (PROAIR HFA) 108 (90 BASE) MCG/ACT inhaler Inhale 2 puffs into the lungs every 6 (six) hours as needed for wheezing or shortness of breath (rescue).     Marland Kitchen aspirin EC 81 MG EC tablet Take 1 tablet (81 mg total) by mouth daily.    . diclofenac sodium (VOLTAREN) 1 % GEL Apply 2 g topically 2 (two) times daily as needed (pain). Applies to knees.    . ferrous sulfate 325 (65 FE) MG tablet Take 325 mg by  mouth 3 (three) times daily with meals.    . Fluticasone-Salmeterol (ADVAIR) 250-50 MCG/DOSE AEPB Inhale 1 puff into the lungs 2 (two) times daily.    . furosemide (LASIX) 80 MG tablet Take 1/2 (one-half) tablet by mouth twice daily 90 tablet 1  . levothyroxine (SYNTHROID) 50 MCG tablet Take 50 mcg by mouth daily.    . metFORMIN (GLUCOPHAGE-XR) 500 MG 24 hr tablet TAKE 1 Tablet BY MOUTH EVERY DAY WITH EVENING MEAL  3  . montelukast (SINGULAIR) 10 MG tablet Take 10 mg by mouth at bedtime as needed (allergies.).     Marland Kitchen Multiple Vitamin (MULTIVITAMIN) capsule Take 1 capsule by mouth daily.    . Omega-3 Fatty Acids (FISH OIL PO) Take 1 capsule by mouth daily.    . potassium chloride SA (KLOR-CON) 20 MEQ tablet Take 1 tablet by mouth twice daily 180 tablet 1  . colchicine 0.6 MG tablet Take 1 tablet (0.6 mg total) by mouth daily as needed. (Patient not taking: Reported on 12/16/2019) 14 tablet 0  . methylPREDNISolone (MEDROL DOSEPAK) 4 MG TBPK tablet 6 Day Taper Pack. Take as Directed. (Patient not taking: Reported on 12/16/2019) 21 tablet 0   No current facility-administered medications on file prior to visit.    Allergies  Allergen Reactions  . Sulfa Antibiotics Hives and Shortness Of Breath  .  Farxiga [Dapagliflozin]     Blisters    Social History:  reports that she has never smoked. She has never used smokeless tobacco. She reports that she does not drink alcohol or use drugs.  Family History  Problem Relation Age of Onset  . Heart disease Mother   . Heart failure Mother        Around 106  . Hypertension Brother   . Hypertension Maternal Grandmother   . Heart disease Maternal Grandmother   . Breast cancer Maternal Aunt     The following portions of the patient's history were reviewed and updated as appropriate: allergies, current medications, past family history, past medical history, past social history, past surgical history and problem list.  Review of Systems Pertinent items  noted in HPI and remainder of comprehensive ROS otherwise negative.  Physical Exam:  BP (!) 143/80   Pulse 72   Ht 5\' 4"  (1.626 m)   Wt (!) 325 lb 3.2 oz (147.5 kg)   LMP 05/12/2012   BMI 55.82 kg/m  CONSTITUTIONAL: Well-developed, well-nourished female in no acute distress.  HENT:  Normocephalic, atraumatic, External right and left ear normal. Oropharynx is clear and moist EYES: Conjunctivae and EOM are normal. Pupils are equal, round, and reactive to light. No scleral icterus.  NECK: Normal range of motion, supple, no masses.  Normal thyroid.  SKIN: Skin is warm and dry. No rash noted. Not diaphoretic. No erythema. No pallor. MUSCULOSKELETAL: Normal range of motion. No tenderness.  No cyanosis, clubbing, or edema.   NEUROLOGIC: Alert and oriented to person, place, and time. Normal reflexes, muscle tone coordination. No cranial nerve deficit noted. PSYCHIATRIC: Normal mood and affect. Normal behavior. Normal judgment and thought content. CARDIOVASCULAR: Normal heart rate noted, regular rhythm RESPIRATORY: Clear to auscultation bilaterally. Effort and breath sounds normal, no problems with respiration noted. BREASTS: Symmetric in size. No masses, tenderness, skin changes, nipple drainage, or lymphadenopathy bilaterally.  Performed in the presence of a chaperone. ABDOMEN: Soft, obese, no distention appreciated.  No tenderness, rebound or guarding.  PELVIC: Normal appearing external genitalia and urethral meatus; normal appearing vaginal mucosa and cervix.  Normal appearing discharge.  Pap smear obtained, had to use long Graves speculum given habitus. Some bleeding noted after Pap smear.  Unable to palpate uterus or adnexa secondary to habitus.  Performed in the presence of a chaperone.    Assessment and Plan:    1. Well woman exam with routine gynecological exam - Cytology - PAP( [place]) Will follow up results of pap smear and manage accordingly.  2. Fibroids No issues, no  intervention needed  Other routine preventative health maintenance measures emphasized. Please refer to After Visit Summary for other counseling recommendations.      Verita Schneiders, MD, [place] for [employer], [place]

## 2019-12-18 LAB — CYTOLOGY - PAP
Comment: NEGATIVE
Diagnosis: NEGATIVE
High risk HPV: NEGATIVE

## 2019-12-26 ENCOUNTER — Ambulatory Visit: Payer: Medicaid Other | Admitting: Podiatry

## 2019-12-26 ENCOUNTER — Other Ambulatory Visit: Payer: Self-pay

## 2019-12-26 VITALS — Temp 97.2°F

## 2019-12-26 DIAGNOSIS — M10072 Idiopathic gout, left ankle and foot: Secondary | ICD-10-CM | POA: Diagnosis not present

## 2019-12-26 DIAGNOSIS — L600 Ingrowing nail: Secondary | ICD-10-CM

## 2019-12-26 DIAGNOSIS — M79676 Pain in unspecified toe(s): Secondary | ICD-10-CM

## 2019-12-26 DIAGNOSIS — M10472 Other secondary gout, left ankle and foot: Secondary | ICD-10-CM

## 2020-01-14 ENCOUNTER — Other Ambulatory Visit: Payer: Self-pay

## 2020-01-14 ENCOUNTER — Ambulatory Visit (HOSPITAL_COMMUNITY)
Admission: EM | Admit: 2020-01-14 | Discharge: 2020-01-14 | Disposition: A | Discharge status: Home / Self Care | Payer: Medicaid Other | Attending: Family Medicine | Admitting: Family Medicine

## 2020-01-14 ENCOUNTER — Encounter (HOSPITAL_COMMUNITY): Payer: Self-pay | Admitting: Emergency Medicine

## 2020-01-14 ENCOUNTER — Ambulatory Visit (INDEPENDENT_AMBULATORY_CARE_PROVIDER_SITE_OTHER): Payer: Medicaid Other

## 2020-01-14 DIAGNOSIS — M79641 Pain in right hand: Secondary | ICD-10-CM

## 2020-01-14 DIAGNOSIS — M7989 Other specified soft tissue disorders: Secondary | ICD-10-CM | POA: Diagnosis not present

## 2020-01-14 MED ORDER — PREDNISONE 10 MG (21) PO TBPK: ORAL_TABLET | ORAL | 0 refills | Status: DC

## 2020-01-14 NOTE — ED Triage Notes (Signed)
PT has pain and swelling in right hand. Right third finger also swollen. 2 weeks ago she "pushed down" down on right hand when going to bed. She was given antibiotics at another practice. Still painful and swollen

## 2020-01-14 NOTE — Discharge Instructions (Signed)
Your x-ray did not show any fractures. This could be some ligamentous or tendon issue. We will place you in a splint and have you follow-up with sports medicine.  Starting you on prednisone taper to take over the next 6 days.  Take this with food. Rest, ice to the hand Follow up as needed for continued or worsening symptoms

## 2020-01-15 NOTE — ED Provider Notes (Signed)
[facility]    CSN: [id number] Arrival date & time: 01/14/20  1335      History   Chief Complaint Chief Complaint  Patient presents with  . Hand Pain    HPI Theresa Kirby is a 64 y.o. female.   Patient is a 64 year old female with past medical history of arthritis, asthma, anemia, obesity, rhabdo, thyroid disease, CHF, OSA.  She presents today with swelling in right middle digit into metacarpals.  There is mild erythema and swelling.  Limited range of motion of the digit.  Started after 2 weeks ago she was putting pressure on the right hand and pushed down and immediately felt pain.  She was then seen in urgent care the next day where she was prescribed medication for gout and infection.  Neither 1 of these resolved her symptoms.  Denies any fevers or history of gout.  ROS per HPI      Past Medical History:  Diagnosis Date  . Arthritis    knees  . Asthma   . Iron deficiency anemia   . Morbid obesity ([place])   . Rhabdomyolysis 04/2016   normal compartment pressures  . Sleep apnea    uses cpap, setting is unknown, does not wear it consistently  . Thyroid disease     Patient Active Problem List   Diagnosis Date Noted  . Incidental pulmonary nodule 06/08/2017  . Mediastinal lymphadenopathy 06/08/2017  . Euthyroid sick syndrome   . Elevated blood pressure reading without diagnosis of hypertension 03/14/2017  . CHF exacerbation ([place]) 03/13/2017  . Hypothyroidism 03/13/2017  . OSA (obstructive sleep apnea) 03/13/2017  . Leg pain, right 05/06/2016  . Right leg swelling 05/06/2016  . SOB (shortness of breath) 05/06/2016  . Irregular bleeding 03/15/2012  . Anxiety 02/08/2012  . Morbid obesity ([place]) 02/08/2012    Past Surgical History:  Procedure Laterality Date  . CESAREAN SECTION    . COLONOSCOPY WITH PROPOFOL N/A 07/21/2015   Procedure: COLONOSCOPY WITH PROPOFOL;  Surgeon: Juanita Craver, MD;  Location: [facility];  Service: Endoscopy;  Laterality:  N/A;  . DILATION AND CURETTAGE OF UTERUS    . ENDOMETRIAL ABLATION    . GANGLION CYST EXCISION     rt arm  . SHOULDER SURGERY Left    Rotator cuff repair    OB History   No obstetric history on file.      Home Medications    Prior to Admission medications   Medication Sig Start Date End Date Taking? Authorizing Provider  acetaminophen (TYLENOL) 325 MG tablet Take 650 mg by mouth every 6 (six) hours as needed (pain).     [provider]  albuterol (PROAIR HFA) 108 (90 BASE) MCG/ACT inhaler Inhale 2 puffs into the lungs every 6 (six) hours as needed for wheezing or shortness of breath (rescue).  06/20/14   [provider]  aspirin EC 81 MG EC tablet Take 1 tablet (81 mg total) by mouth daily. 03/20/17   Eugenie Filler, MD  diclofenac sodium (VOLTAREN) 1 % GEL Apply 2 g topically 2 (two) times daily as needed (pain). Applies to knees. 06/13/14   [provider]  ferrous sulfate 325 (65 FE) MG tablet Take 325 mg by mouth 3 (three) times daily with meals.    [provider]  Fluticasone-Salmeterol (ADVAIR) 250-50 MCG/DOSE AEPB Inhale 1 puff into the lungs 2 (two) times daily.    [provider]  furosemide (LASIX) 80 MG tablet Take 1/2 (one-half) tablet by mouth  twice daily 07/01/19   Burtis Junes, NP  levothyroxine (SYNTHROID) 50 MCG tablet Take 50 mcg by mouth daily. 02/26/19   [provider]  metFORMIN (GLUCOPHAGE-XR) 500 MG 24 hr tablet TAKE 1 Tablet BY MOUTH EVERY DAY WITH EVENING MEAL 10/25/17   [provider]  montelukast (SINGULAIR) 10 MG tablet Take 10 mg by mouth at bedtime as needed (allergies.).     [provider]  Multiple Vitamin (MULTIVITAMIN) capsule Take 1 capsule by mouth daily.    [provider]  Omega-3 Fatty Acids (FISH OIL PO) Take 1 capsule by mouth daily.    [provider]  potassium chloride SA (KLOR-CON) 20 MEQ tablet Take 1 tablet by mouth twice daily 10/09/19    Burtis Junes, NP  predniSONE (STERAPRED UNI-PAK 21 TAB) 10 MG (21) TBPK tablet 6 tabs for 1 day, then 5 tabs for 1 das, then 4 tabs for 1 day, then 3 tabs for 1 day, 2 tabs for 1 day, then 1 tab for 1 day 01/14/20   Loura Halt A, NP  colchicine 0.6 MG tablet Take 1 tablet (0.6 mg total) by mouth daily as needed. Patient not taking: Reported on 12/16/2019 12/09/19 01/14/20  Evelina Bucy, DPM    Family History Family History  Problem Relation Age of Onset  . Heart disease Mother   . Heart failure Mother        Around 39  . Hypertension Brother   . Hypertension Maternal Grandmother   . Heart disease Maternal Grandmother   . Breast cancer Maternal Aunt     Social History Social History   Tobacco Use  . Smoking status: Never Smoker  . Smokeless tobacco: Never Used  Vaping Use  . Vaping Use: Never used  Substance Use Topics  . Alcohol use: No  . Drug use: No     Allergies   Sulfa antibiotics and Farxiga [dapagliflozin]   Review of Systems Review of Systems   Physical Exam Triage Vital Signs ED Triage Vitals  Enc Vitals Group     BP 01/14/20 1352 125/67     Pulse Rate 01/14/20 1352 68     Resp 01/14/20 1352 16     Temp 01/14/20 1352 98.6 F (37 C)     Temp Source 01/14/20 1352 Oral     SpO2 01/14/20 1352 95 %     Weight --      Height --      Head Circumference --      Peak Flow --      Pain Score 01/14/20 1351 8     Pain Loc --      Pain Edu? --      Excl. in [place]? --    No data found.  Updated Vital Signs BP 125/67   Pulse 68   Temp 98.6 F (37 C) (Oral)   Resp 16   LMP 05/12/2012   SpO2 95%   Visual Acuity Right Eye Distance:   Left Eye Distance:   Bilateral Distance:    Right Eye Near:   Left Eye Near:    Bilateral Near:     Physical Exam Vitals and nursing note reviewed.  Constitutional:      General: She is not in acute distress.    Appearance: Normal appearance. She is not ill-appearing, toxic-appearing or diaphoretic.  HENT:      Head: Normocephalic.     Nose: Nose normal.  Eyes:     Conjunctiva/sclera: Conjunctivae normal.  Pulmonary:     Effort: Pulmonary effort is normal.  Musculoskeletal:        General: Normal range of motion.       Hands:     Cervical back: Normal range of motion.     Comments: Swelling to right third digit extending into metacarpals with mild erythema.  Limited range of motion of the middle digit. Tender to palpation  Skin:    General: Skin is warm and dry.     Findings: No rash.  Neurological:     Mental Status: [name].  Psychiatric:        Mood and Affect: Mood normal.      UC Treatments / Results  Labs (all labs ordered are listed, but only abnormal results are displayed) Labs Reviewed - No data to display  EKG   Radiology DG Hand Complete Right  Result Date: 01/14/2020 CLINICAL DATA:  Hand swelling and pain EXAM: RIGHT HAND - COMPLETE 3+ VIEW COMPARISON:  None. FINDINGS: No fracture or malalignment. Mild degenerative changes at the first MCP joint and at the D IP and PIP joints. No erosions. No radiopaque foreign body. IMPRESSION: Mild degenerative changes.  No acute osseous abnormality. Electronically Signed   By: Donavan Foil M.D.   On: 01/14/2020 15:45    Procedures Procedures (including critical care time)  Medications Ordered in UC Medications - No data to display  Initial Impression / Assessment and Plan / UC Course  I have reviewed the triage vital signs and the nursing notes.  Pertinent labs & imaging results that were available during my care of the patient were reviewed by me and considered in my medical decision making (see chart for details).     Right hand pain X-ray negative for any acute findings.  Mild degenerative changes at the first MCP joint and DIP and PIP joints. No fractures. Concern for some sort of ligamentous injury. We will place her in a splint and have her follow-up with orthopedics Prednisone taper over the next 6 days  for pain, inflammation and swelling. Follow up as needed for continued or worsening symptoms  Final Clinical Impressions(s) / UC Diagnoses   Final diagnoses:  Hand pain, right     Discharge Instructions     Your x-ray did not show any fractures. This could be some ligamentous or tendon issue. We will place you in a splint and have you follow-up with sports medicine.  Starting you on prednisone taper to take over the next 6 days.  Take this with food. Rest, ice to the hand Follow up as needed for continued or worsening symptoms     ED Prescriptions    Medication Sig Dispense Auth. Provider   predniSONE (STERAPRED UNI-PAK 21 TAB) 10 MG (21) TBPK tablet 6 tabs for 1 day, then 5 tabs for 1 das, then 4 tabs for 1 day, then 3 tabs for 1 day, 2 tabs for 1 day, then 1 tab for 1 day 21 tablet Aerionna Moravek A, NP     PDMP not reviewed this encounter.   Loura Halt A, NP 01/15/20 1017

## 2020-01-20 ENCOUNTER — Other Ambulatory Visit: Payer: Self-pay

## 2020-01-20 ENCOUNTER — Ambulatory Visit: Payer: Self-pay

## 2020-01-20 ENCOUNTER — Ambulatory Visit: Payer: Medicaid Other | Admitting: Family Medicine

## 2020-01-20 ENCOUNTER — Encounter: Payer: Self-pay | Admitting: Family Medicine

## 2020-01-20 VITALS — BP 127/83 | HR 80 | Ht 64.0 in | Wt 324.0 lb

## 2020-01-20 DIAGNOSIS — M79641 Pain in right hand: Secondary | ICD-10-CM

## 2020-01-20 DIAGNOSIS — M10041 Idiopathic gout, right hand: Secondary | ICD-10-CM | POA: Insufficient documentation

## 2020-01-20 DIAGNOSIS — M109 Gout, unspecified: Secondary | ICD-10-CM | POA: Insufficient documentation

## 2020-01-20 MED ORDER — COLCHICINE 0.6 MG PO TABS: 0.6000 mg | ORAL_TABLET | Freq: Two times a day (BID) | ORAL | 1 refills | Status: DC

## 2020-01-20 NOTE — Patient Instructions (Signed)
NIce to meet you Please take the colchicine until you are two days without pain   Please avoid processed food and drink water  Please send me a message in MyChart with any questions or updates.  Please see me back in 3 weeks.   --[email or web address] Please decrease the amount of soda and beer.

## 2020-01-20 NOTE — Progress Notes (Signed)
Theresa Kirby - 64 y.o. female MRN [id number]  Date of birth: 1955/07/29  SUBJECTIVE:  Including CC & ROS.  Chief Complaint  Patient presents with  . Hand Pain    right x 3 weeks    Theresa Kirby is a 64 y.o. female that is presenting with right hand pain.  She denies any specific inciting event or trauma.  The pain is occurring over the dorsum of the third MCP joint.  She feels it worse when she is trying to press down or up from a seated position.  Is localized to this area.  Has had color changes and swelling in that area..  Independent review of right hand x-ray from 6/22 shows no acute abnormality.   Review of Systems See HPI   HISTORY: Past Medical, Surgical, Social, and Family History Reviewed & Updated per EMR.   Pertinent Historical Findings include:  Past Medical History:  Diagnosis Date  . Arthritis    knees  . Asthma   . Iron deficiency anemia   . Morbid obesity ([place])   . Rhabdomyolysis 04/2016   normal compartment pressures  . Sleep apnea    uses cpap, setting is unknown, does not wear it consistently  . Thyroid disease     Past Surgical History:  Procedure Laterality Date  . CESAREAN SECTION    . COLONOSCOPY WITH PROPOFOL N/A 07/21/2015   Procedure: COLONOSCOPY WITH PROPOFOL;  Surgeon: Juanita Craver, MD;  Location: [facility];  Service: Endoscopy;  Laterality: N/A;  . DILATION AND CURETTAGE OF UTERUS    . ENDOMETRIAL ABLATION    . GANGLION CYST EXCISION     rt arm  . SHOULDER SURGERY Left    Rotator cuff repair    Family History  Problem Relation Age of Onset  . Heart disease Mother   . Heart failure Mother        Around 89  . Hypertension Brother   . Hypertension Maternal Grandmother   . Heart disease Maternal Grandmother   . Breast cancer Maternal Aunt     Social History   Socioeconomic History  . Marital status: Single    Spouse name: Not on file  . Number of children: Not on file  . Years of education: Not on file  . Highest  education level: Not on file  Occupational History  . 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 Smoking status: Never Smoker  . Smokeless tobacco: Never Used  Vaping Use  . Vaping Use: Never used  Substance and Sexual Activity  . Alcohol use: No  . Drug use: No  . Sexual activity: Not Currently    Birth control/protection: None  Other Topics Concern  . Not on file  Social History Narrative  . Not on file   Social Determinants of Health   Financial Resource Strain:   . Difficulty of Paying Living Expenses:   Food Insecurity:   . Worried About Charity fundraiser in the Last Year:   . Arboriculturist in the Last Year:   Transportation Needs:   . Film/video editor (Medical):   Marland Kitchen Lack of Transportation (Non-Medical):   Physical Activity:   . Days of Exercise per Week:   . Minutes of Exercise per Session:   Stress:   . Feeling of Stress :   Social Connections:   . Frequency of Communication with Friends and Family:   . Frequency of Social Gatherings with Friends and  Family:   . Attends Religious Services:   . Active Member of Clubs or Organizations:   . Attends Archivist Meetings:   Marland Kitchen Marital Status:   Intimate Partner Violence:   . Fear of Current or Ex-Partner:   . Emotionally Abused:   Marland Kitchen Physically Abused:   . Sexually Abused:      PHYSICAL EXAM:  VS: BP 127/83   Pulse 80   Ht 5\' 4"  (1.626 m)   Wt (!) 324 lb (147 kg)   LMP 05/12/2012   BMI 55.61 kg/m  Physical Exam Gen: NAD, alert, cooperative with exam, well-appearing MSK:  Right hand: Tenderness palpation of the dorsum of the third MCP joint. Swelling and color changes of the dorsum of the third MCP joint. Limited flexion of the third MCP joint. Neurovascularly intact  Limited ultrasound: Right hand:  Synovitis of the third MCP joint with hyperemia.  Increased hyper echogenicity to suggest crystalline deposition.  No changes appreciated in the second or fourth  metacarpal joints.   Summary: Findings suggest a gouty synovitis.  Ultrasound and interpretation by Clearance Coots, MD      ASSESSMENT & PLAN:   Acute idiopathic gout of right hand Symptoms are likely related to gouty attack.  Changes observed on ultrasound and has a history of elevated uric acid from a few months ago. -Counseled supportive care. -Colchicine. -Would obtain repeat uric acid for initiation of allopurinol.

## 2020-01-20 NOTE — Assessment & Plan Note (Signed)
Symptoms are likely related to gouty attack.  Changes observed on ultrasound and has a history of elevated uric acid from a few months ago. -Counseled supportive care. -Colchicine. -Would obtain repeat uric acid for initiation of allopurinol.

## 2020-01-27 NOTE — Progress Notes (Signed)
  Subjective:  Patient ID: Theresa Kirby, female    DOB: 1955/09/05,  MRN: [id number]  Chief Complaint  Patient presents with  . Follow-up    ? Gout - L dorsal forefoot and L hallux. Pt stated, "I called the office a couple of weeks ago because I had gout symptoms x1 week. One of the MDs Rx'd colchicine. I took it until my symptoms resolved".  . Nail Problem    L hallux. Pt stated, "I have darkness and tenderness below the nail. I wonder if it's a symptom of an ingrown toenail - I have difficulty trimming the edges of the nail".   64 y.o. female presents with the above complaint. History confirmed with patient. States the foot feels much better and is back to normal.  Objective:  Physical Exam: warm, good capillary refill, no trophic changes or ulcerative lesions, normal DP and PT pulses and normal sensory exam.  Pain to palpation about the left first MPJ with local warmth and erythema  Assessment:   1. Ingrown nail   2. Pain around toenail   3. Acute gout due to other secondary cause involving toe of left foot     Plan:  Patient was evaluated and treated and all questions answered.  Gout L 1st MPJ with inflammatory Capsulitis -Repeat injection to the left first MPJ  Ingrown Nail -Gently debrided in slant back fashion  No follow-ups on file.

## 2020-02-06 ENCOUNTER — Ambulatory Visit: Payer: Medicaid Other | Admitting: Family Medicine

## 2020-02-06 NOTE — Progress Notes (Deleted)
Theresa Kirby - 64 y.o. female MRN [id number]  Date of birth: Nov 28, 1955  SUBJECTIVE:  Including CC & ROS.  No chief complaint on file.   Theresa Kirby is a 64 y.o. female that is  ***.  ***   Review of Systems See HPI   HISTORY: Past Medical, Surgical, Social, and Family History Reviewed & Updated per EMR.   Pertinent Historical Findings include:  Past Medical History:  Diagnosis Date  . Arthritis    knees  . Asthma   . Iron deficiency anemia   . Morbid obesity ([place])   . Rhabdomyolysis 04/2016   normal compartment pressures  . Sleep apnea    uses cpap, setting is unknown, does not wear it consistently  . Thyroid disease     Past Surgical History:  Procedure Laterality Date  . CESAREAN SECTION    . COLONOSCOPY WITH PROPOFOL N/A 07/21/2015   Procedure: COLONOSCOPY WITH PROPOFOL;  Surgeon: Juanita Craver, MD;  Location: [facility];  Service: Endoscopy;  Laterality: N/A;  . DILATION AND CURETTAGE OF UTERUS    . ENDOMETRIAL ABLATION    . GANGLION CYST EXCISION     rt arm  . SHOULDER SURGERY Left    Rotator cuff repair    Family History  Problem Relation Age of Onset  . Heart disease Mother   . Heart failure Mother        Around 49  . Hypertension Brother   . Hypertension Maternal Grandmother   . Heart disease Maternal Grandmother   . Breast cancer Maternal Aunt     Social History   Socioeconomic History  . Marital status: Single    Spouse name: Not on file  . Number of children: Not on file  . Years of education: Not on file  . Highest education level: Not on file  Occupational History  . 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 Smoking status: Never Smoker  . Smokeless tobacco: Never Used  Vaping Use  . Vaping Use: Never used  Substance and Sexual Activity  . Alcohol use: No  . Drug use: No  . Sexual activity: Not Currently    Birth control/protection: None  Other Topics Concern  . Not on file  Social History  Narrative  . Not on file   Social Determinants of Health   Financial Resource Strain:   . Difficulty of Paying Living Expenses:   Food Insecurity:   . Worried About Charity fundraiser in the Last Year:   . Arboriculturist in the Last Year:   Transportation Needs:   . Film/video editor (Medical):   Marland Kitchen Lack of Transportation (Non-Medical):   Physical Activity:   . Days of Exercise per Week:   . Minutes of Exercise per Session:   Stress:   . Feeling of Stress :   Social Connections:   . Frequency of Communication with Friends and Family:   . Frequency of Social Gatherings with Friends and Family:   . Attends Religious Services:   . Active Member of Clubs or Organizations:   . Attends Archivist Meetings:   Marland Kitchen Marital Status:   Intimate Partner Violence:   . Fear of Current or Ex-Partner:   . Emotionally Abused:   Marland Kitchen Physically Abused:   . Sexually Abused:      PHYSICAL EXAM:  VS: LMP 05/12/2012  Physical Exam Gen: NAD, alert, cooperative with exam, well-appearing MSK:  ***  ASSESSMENT & PLAN:   No problem-specific Assessment & Plan notes found for this encounter.

## 2020-02-10 ENCOUNTER — Other Ambulatory Visit: Payer: Self-pay | Admitting: Nurse Practitioner

## 2020-02-18 ENCOUNTER — Other Ambulatory Visit: Payer: Self-pay

## 2020-02-18 ENCOUNTER — Encounter: Payer: Self-pay | Admitting: Family Medicine

## 2020-02-18 ENCOUNTER — Ambulatory Visit (INDEPENDENT_AMBULATORY_CARE_PROVIDER_SITE_OTHER): Payer: Medicaid Other | Admitting: Family Medicine

## 2020-02-18 VITALS — BP 130/83 | HR 71 | Ht 64.0 in | Wt 322.0 lb

## 2020-02-18 DIAGNOSIS — M10041 Idiopathic gout, right hand: Secondary | ICD-10-CM | POA: Diagnosis not present

## 2020-02-18 NOTE — Patient Instructions (Signed)
Good to see you I will call with the results from today   Please send me a message in MyChart with any questions or updates.  Please see us back as needed.   --Dr. Tamberly Pomplun  

## 2020-02-18 NOTE — Assessment & Plan Note (Signed)
Symptoms improved with colchicine. -Counseled supportive care. -Uric acid. -Follow-up as needed.

## 2020-02-18 NOTE — Progress Notes (Signed)
Theresa Kirby - 64 y.o. female MRN [id number]  Date of birth: [birth year]  SUBJECTIVE:  Including CC & ROS.  Chief Complaint  Patient presents with  . Follow-up    right hand    Theresa Kirby is a 64 y.o. female that is following up for her right hand pain.  She is able to get colchicine sent by her primary doctor.  Since taking that medication and she has had improvement of her symptoms.   Review of Systems See HPI   HISTORY: Past Medical, Surgical, Social, and Family History Reviewed & Updated per EMR.   Pertinent Historical Findings include:  Past Medical History:  Diagnosis Date  . Arthritis    knees  . Asthma   . Iron deficiency anemia   . Morbid obesity ([place])   . Rhabdomyolysis 04/2016   normal compartment pressures  . Sleep apnea    uses cpap, setting is unknown, does not wear it consistently  . Thyroid disease     Past Surgical History:  Procedure Laterality Date  . CESAREAN SECTION    . COLONOSCOPY WITH PROPOFOL N/A 07/21/2015   Procedure: COLONOSCOPY WITH PROPOFOL;  Surgeon: Juanita Craver, MD;  Location: [facility];  Service: Endoscopy;  Laterality: N/A;  . DILATION AND CURETTAGE OF UTERUS    . ENDOMETRIAL ABLATION    . GANGLION CYST EXCISION     rt arm  . SHOULDER SURGERY Left    Rotator cuff repair    Family History  Problem Relation Age of Onset  . Heart disease Mother   . Heart failure Mother        Around 38  . Hypertension Brother   . Hypertension Maternal Grandmother   . Heart disease Maternal Grandmother   . Breast cancer Maternal Aunt     Social History   Socioeconomic History  . Marital status: Single    Spouse name: Not on file  . Number of children: Not on file  . Years of education: Not on file  . Highest education level: Not on file  Occupational History  . 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 Smoking status: Never Smoker  . Smokeless tobacco: Never Used  Vaping Use  . Vaping Use: Never  used  Substance and Sexual Activity  . Alcohol use: No  . Drug use: No  . Sexual activity: Not Currently    Birth control/protection: None  Other Topics Concern  . Not on file  Social History Narrative  . Not on file   Social Determinants of Health   Financial Resource Strain:   . Difficulty of Paying Living Expenses:   Food Insecurity:   . Worried About Charity fundraiser in the Last Year:   . Arboriculturist in the Last Year:   Transportation Needs:   . Film/video editor (Medical):   Marland Kitchen Lack of Transportation (Non-Medical):   Physical Activity:   . Days of Exercise per Week:   . Minutes of Exercise per Session:   Stress:   . Feeling of Stress :   Social Connections:   . Frequency of Communication with Friends and Family:   . Frequency of Social Gatherings with Friends and Family:   . Attends Religious Services:   . Active Member of Clubs or Organizations:   . Attends Archivist Meetings:   Marland Kitchen Marital Status:   Intimate Partner Violence:   . Fear of Current or Ex-Partner:   .  Emotionally Abused:   Marland Kitchen Physically Abused:   . Sexually Abused:      PHYSICAL EXAM:  VS: BP (!) 130/83   Pulse 71   Ht 5\' 4"  (1.626 m)   Wt (!) 322 lb (146.1 kg)   LMP 05/12/2012   BMI 55.27 kg/m  Physical Exam Gen: NAD, alert, cooperative with exam, well-appearing MSK:  Right hand: No redness.   Does have some swelling between the second and third and fourth interdigit space. Neurovascularly intact    ASSESSMENT & PLAN:   Acute idiopathic gout of right hand Symptoms improved with colchicine. -Counseled supportive care. -Uric acid. -Follow-up as needed.

## 2020-02-19 ENCOUNTER — Telehealth: Payer: Self-pay | Admitting: Family Medicine

## 2020-02-19 DIAGNOSIS — M10041 Idiopathic gout, right hand: Secondary | ICD-10-CM

## 2020-02-19 LAB — URIC ACID: Uric Acid: 8.6 mg/dL — ABNORMAL HIGH (ref 3.0–7.2)

## 2020-02-19 NOTE — Telephone Encounter (Signed)
Left VM for patient. If she calls back please have her speak with a nurse/CMA and inform her that her uric acid was still elevated. She would need to be started on allopurinol. The problem is her kidney function needs to be tested as well. We will need to test it if it has not been tested in some time..   If any questions then please take the best time and phone number to call and I will try to call her back.   Rosemarie Ax, MD [facility] 02/19/2020, 8:38 AM

## 2020-02-19 NOTE — Telephone Encounter (Signed)
Patient returned provider's call.@ [phone]  -glh

## 2020-02-19 NOTE — Telephone Encounter (Signed)
Spoke with patient about uric acid.  Will obtain creatinine level before starting allopurinol.  Rosemarie Ax, MD [facility] 02/19/2020, 3:30 PM

## 2020-02-20 ENCOUNTER — Other Ambulatory Visit: Payer: Self-pay | Admitting: Family Medicine

## 2020-02-21 ENCOUNTER — Telehealth: Payer: Self-pay | Admitting: Family Medicine

## 2020-02-21 LAB — COMPREHENSIVE METABOLIC PANEL
ALT: 7 IU/L (ref 0–32)
AST: 16 IU/L (ref 0–40)
Albumin/Globulin Ratio: 1.6 (ref 1.2–2.2)
Albumin: 4.6 g/dL (ref 3.8–4.8)
Alkaline Phosphatase: 46 IU/L — ABNORMAL LOW (ref 48–121)
BUN/Creatinine Ratio: 20 (ref 12–28)
BUN: 19 mg/dL (ref 8–27)
Bilirubin Total: 0.3 mg/dL (ref 0.0–1.2)
CO2: 27 mmol/L (ref 20–29)
Calcium: 9.6 mg/dL (ref 8.7–10.3)
Chloride: 99 mmol/L (ref 96–106)
Creatinine, Ser: 0.95 mg/dL (ref 0.57–1.00)
GFR calc Af Amer: 74 mL/min/{1.73_m2} (ref >59)
GFR calc non Af Amer: 64 mL/min/{1.73_m2} (ref >59)
Globulin, Total: 2.8 g/dL (ref 1.5–4.5)
Glucose: 100 mg/dL — ABNORMAL HIGH (ref 65–99)
Potassium: 4.4 mmol/L (ref 3.5–5.2)
Sodium: 140 mmol/L (ref 134–144)
Total Protein: 7.4 g/dL (ref 6.0–8.5)

## 2020-02-21 MED ORDER — ALLOPURINOL 100 MG PO TABS: 100.0000 mg | ORAL_TABLET | Freq: Every day | ORAL | 1 refills | Status: DC

## 2020-02-21 NOTE — Telephone Encounter (Signed)
Informed of results and sent allopurinol in   Theresa Ax, MD [facility] 02/21/2020, 11:59 AM

## 2020-03-19 ENCOUNTER — Encounter: Payer: Self-pay | Admitting: Cardiology

## 2020-03-19 ENCOUNTER — Ambulatory Visit (INDEPENDENT_AMBULATORY_CARE_PROVIDER_SITE_OTHER): Payer: Medicare Other | Admitting: Cardiology

## 2020-03-19 ENCOUNTER — Other Ambulatory Visit: Payer: Self-pay

## 2020-03-19 VITALS — BP 120/80 | HR 69 | Ht 64.0 in | Wt 317.0 lb

## 2020-03-19 DIAGNOSIS — R911 Solitary pulmonary nodule: Secondary | ICD-10-CM

## 2020-03-19 DIAGNOSIS — I5032 Chronic diastolic (congestive) heart failure: Secondary | ICD-10-CM

## 2020-03-19 MED ORDER — FUROSEMIDE 40 MG PO TABS: 40.0000 mg | ORAL_TABLET | Freq: Every day | ORAL | 3 refills | Status: DC

## 2020-03-19 NOTE — Progress Notes (Signed)
Cardiology Office Note:    Date:  03/19/2020   ID:  Theresa Kirby, DOB August 02, 1955, MRN [id number]  PCP:  Vassie Moment, MD  [facility] Cardiologist:  Candee Furbish, MD  [facility] Electrophysiologist:  None   Referring MD: Vassie Moment, MD     History of Present Illness:    Theresa Kirby is a 64 y.o. female here for the follow-up of morbid obesity, chronic diastolic heart failure.  Doing well with no chest pain.  No syncope.  Occasionally will have shortness of breath but not all of the time with activity.  She is down close to 10 pounds since June.  She is trying to drink more fluids to help with her uric acid level being elevated. She is also taking the furosemide once a day, 40 mg instead of twice.  On Workmen's Comp. previously for foot injury.  Last hospitalization in 2018 with 12 L diuresis.  Past Medical History:  Diagnosis Date   Arthritis    knees   Asthma    Iron deficiency anemia    Morbid obesity ([place])    Rhabdomyolysis 04/2016   normal compartment pressures   Sleep apnea    uses cpap, setting is unknown, does not wear it consistently   Thyroid disease     Past Surgical History:  Procedure Laterality Date   CESAREAN SECTION     COLONOSCOPY WITH PROPOFOL N/A 07/21/2015   Procedure: COLONOSCOPY WITH PROPOFOL;  Surgeon: Juanita Craver, MD;  Location: [facility];  Service: Endoscopy;  Laterality: N/A;   DILATION AND CURETTAGE OF UTERUS     ENDOMETRIAL ABLATION     GANGLION CYST EXCISION     rt arm   SHOULDER SURGERY Left    Rotator cuff repair    Current Medications: Current Meds  Medication Sig   acetaminophen (TYLENOL) 325 MG tablet Take 650 mg by mouth every 6 (six) hours as needed (pain).    albuterol (PROAIR HFA) 108 (90 BASE) MCG/ACT inhaler Inhale 2 puffs into the lungs every 6 (six) hours as needed for wheezing or shortness of breath (rescue).    allopurinol (ZYLOPRIM) 100 MG tablet Take 1 tablet (100 mg total) by  mouth daily.   aspirin EC 81 MG EC tablet Take 1 tablet (81 mg total) by mouth daily.   atorvastatin (LIPITOR) 20 MG tablet Take 20 mg by mouth at bedtime.   colchicine 0.6 MG tablet Take 1 tablet (0.6 mg total) by mouth 2 (two) times daily.   diclofenac sodium (VOLTAREN) 1 % GEL Apply 2 g topically 2 (two) times daily as needed (pain). Applies to knees.   ferrous sulfate 325 (65 FE) MG tablet Take 325 mg by mouth 3 (three) times daily with meals.   Fluticasone-Salmeterol (ADVAIR) 250-50 MCG/DOSE AEPB Inhale 1 puff into the lungs 2 (two) times daily.   furosemide (LASIX) 40 MG tablet Take 1 tablet (40 mg total) by mouth daily.   levothyroxine (SYNTHROID) 50 MCG tablet Take 50 mcg by mouth daily.   montelukast (SINGULAIR) 10 MG tablet Take 10 mg by mouth at bedtime as needed (allergies.).    Multiple Vitamin (MULTIVITAMIN) capsule Take 1 capsule by mouth daily.   Omega-3 Fatty Acids (FISH OIL PO) Take 1 capsule by mouth daily.   potassium chloride SA (KLOR-CON) 20 MEQ tablet Take 1 tablet by mouth twice daily   VICTOZA 18 MG/3ML SOPN Inject 1.8 mg into the skin daily.   [DISCONTINUED] furosemide (LASIX) 80 MG tablet Take 1/2 (one-half)  tablet by mouth twice daily (Patient taking differently: Take 80 mg by mouth daily. 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  Tobacco Use   Smoking status: Never Smoker   Smokeless tobacco: Never Used  Scientific laboratory technician Use: Never used  Substance and Sexual Activity   Alcohol use: No   Drug use: No   Sexual activity: Not Currently    Birth control/protection: None  Other Topics Concern   Not on file  Social History  Narrative   Not on file   Social Determinants of Health   Financial Resource Strain:    Difficulty of Paying Living Expenses: Not on file  Food Insecurity:    Worried About Charity fundraiser in the Last Year: Not on file   [place] in the Last Year: Not on file  Transportation Needs:    Lack of Transportation (Medical): Not on file   Lack of Transportation (Non-Medical): Not on file  Physical Activity:    Days of Exercise per Week: Not on file   Minutes of Exercise per Session: Not on file  Stress:    Feeling of Stress : Not on file  Social Connections:    Frequency of Communication with Friends and Family: Not on file   Frequency of Social Gatherings with Friends and Family: Not on file   Attends Religious Services: Not on file   Active Member of Clubs or Organizations: Not on file   Attends Archivist Meetings: Not on file   Marital Status: Not on file     Family History: The patient's family history includes Breast cancer in her maternal aunt; Heart disease in her maternal grandmother and mother; Heart failure in her mother; Hypertension in her brother and maternal grandmother.  ROS:   Please see the history of present illness.     All other systems reviewed and are negative.  EKGs/Labs/Other Studies Reviewed:    The following studies were reviewed today:  Echo in 2018 showed EF of 65% with grade 2 diastolic dysfunction and mildly D-shaped interventricular septum suggestive of RV volume overload.  EKG:  EKG is  ordered today.  The ekg ordered today demonstrates sinus rhythm 69 no other abnormalities. prior EKG showed sinus rhythm 78 with PAC.  Recent Labs: 02/20/2020: ALT 7; BUN 19; Creatinine, Ser 0.95; Potassium 4.4; Sodium 140  Recent Lipid Panel    Component Value Date/Time   CHOL 151 03/14/2017 0427   TRIG 107 03/14/2017 0427   HDL 40 (L) 03/14/2017 0427   CHOLHDL 3.8 03/14/2017 0427   VLDL 21 03/14/2017 0427   LDLCALC 90  03/14/2017 0427    Physical Exam:    VS:  BP 120/80    Pulse 69    Ht 5\' 4"  (1.626 m)    Wt (!) 317 lb (143.8 kg)    LMP 05/12/2012    SpO2 97%    BMI 54.41 kg/m     Wt Readings from Last 3 Encounters:  03/19/20 (!) 317 lb (143.8 kg)  02/18/20 (!) 322 lb (146.1 kg)  01/20/20 (!) 324 lb (147 kg)  GEN: obese Well nourished, well developed in no acute distress HEENT: Normal NECK: No JVD; No carotid bruits LYMPHATICS: No lymphadenopathy CARDIAC: RRR, no murmurs, rubs, gallops RESPIRATORY:  Clear to auscultation without rales, wheezing or rhonchi  ABDOMEN: Soft, non-tender, non-distended MUSCULOSKELETAL:  No edema; No deformity  SKIN: Warm and dry NEUROLOGIC:  Alert and oriented x 3 PSYCHIATRIC:  Normal affect   ASSESSMENT:    1. Chronic diastolic heart failure ([place])   2. Morbid obesity ([place])   3. Pulmonary nodule    PLAN:    In order of problems listed above:  Chronic diastolic heart failure in the setting of morbid obesity -Continue to encourage weight loss -40 mg of Lasix once a day is fine as long as she is monitoring her weights.  Echo reassuring. -Creatinine 0.95 with potassium 4.4 last check.  ALT 7.  Obstructive sleep apnea -No changes with CPAP.  She is compliant with this.  Fatty liver disease noted on CT scan -Continue to monitor, continue to encourage weight loss.  ALT 7.  Pulmonary nodule -Monitoring on with CT scan by primary physician, Dr. Lavonia Drafts.   Medication Adjustments/Labs and Tests Ordered: Current medicines are reviewed at length with the patient today.  Concerns regarding medicines are outlined above.  Orders Placed This Encounter  Procedures   EKG 12-Lead   Meds ordered this encounter  Medications   furosemide (LASIX) 40 MG tablet    Sig: Take 1 tablet (40 mg total) by mouth daily.    Dispense:  90 tablet    Refill:  3    Will call when ready    Patient Instructions  Medication Instructions:  The current medical regimen is  effective;  continue present plan and medications.  *If you need a refill on your cardiac medications before your next appointment, please call your pharmacy*  Follow-Up: At [facility], you and your health needs are our priority.  As part of our continuing mission to provide you with exceptional heart care, we have created designated Provider Care Teams.  These Care Teams include your primary Cardiologist (physician) and Advanced Practice Providers (APPs -  Physician Assistants and Nurse Practitioners) who all work together to provide you with the care you need, when you need it.  We recommend signing up for the patient portal called "MyChart".  Sign up information is provided on this After Visit Summary.  MyChart is used to connect with patients for Virtual Visits (Telemedicine).  Patients are able to view lab/test results, encounter notes, upcoming appointments, etc.  Non-urgent messages can be sent to your provider as well.   To learn more about what you can do with MyChart, go to [email or web address].    Your next appointment:   12 month(s)  The format for your next appointment:   In Person  Provider:   You may see Candee Furbish, MD or one of the following Advanced Practice Providers on your designated Care Team:    Truitt Merle, NP  Cecilie Kicks, NP  Kathyrn Drown, NP    Thank you for choosing [facility]!!        Signed, Candee Furbish, MD  03/19/2020 3:44 PM    [place]

## 2020-03-19 NOTE — Patient Instructions (Addendum)
Medication Instructions:  The current medical regimen is effective;  continue present plan and medications.  *If you need a refill on your cardiac medications before your next appointment, please call your pharmacy*  Follow-Up: At [facility], you and your health needs are our priority.  As part of our continuing mission to provide you with exceptional heart care, we have created designated Provider Care Teams.  These Care Teams include your primary Cardiologist (physician) and Advanced Practice Providers (APPs -  Physician Assistants and Nurse Practitioners) who all work together to provide you with the care you need, when you need it.  We recommend signing up for the patient portal called "MyChart".  Sign up information is provided on this After Visit Summary.  MyChart is used to connect with patients for Virtual Visits (Telemedicine).  Patients are able to view lab/test results, encounter notes, upcoming appointments, etc.  Non-urgent messages can be sent to your provider as well.   To learn more about what you can do with MyChart, go to [email or web address].    Your next appointment:   12 month(s)  The format for your next appointment:   In Person  Provider:   You may see Candee Furbish, MD or one of the following Advanced Practice Providers on your designated Care Team:    Truitt Merle, NP  Cecilie Kicks, NP  Kathyrn Drown, NP    Thank you for choosing [facility]!!

## 2020-07-09 ENCOUNTER — Telehealth: Payer: Self-pay | Admitting: Pulmonary Disease

## 2020-07-09 NOTE — Telephone Encounter (Signed)
Called insurance and had to change location of the ct to [place]

## 2020-07-28 ENCOUNTER — Other Ambulatory Visit: Payer: Medicaid Other

## 2020-09-04 ENCOUNTER — Other Ambulatory Visit: Payer: Self-pay | Admitting: Nurse Practitioner

## 2020-09-08 ENCOUNTER — Other Ambulatory Visit: Payer: Self-pay

## 2020-09-08 MED ORDER — FUROSEMIDE 40 MG PO TABS: 40.0000 mg | ORAL_TABLET | Freq: Every day | ORAL | 2 refills | Status: DC

## 2021-02-25 ENCOUNTER — Telehealth: Payer: Self-pay | Admitting: Cardiology

## 2021-02-25 NOTE — Telephone Encounter (Signed)
Spoke with Andee Poles and gave her information from Medical records. She verbalized understanding and will get in touch with the patient.

## 2021-02-25 NOTE — Telephone Encounter (Signed)
Andee Poles is calling requesting the patient's last OV notes from 03/19/20 be faxed over to her. Her fax number is [phone]. Please advise.

## 2021-03-08 NOTE — Progress Notes (Addendum)
Cardiology Office Note    Date:  03/16/2021   ID:  Lawan, Lingo 1955/10/26, MRN [id number]   PCP:  Vassie Moment, MD (Inactive)   [facility]  Cardiologist:  Candee Furbish, MD   Advanced Practice Provider:  No care team member to display Electrophysiologist:  None   [phone]   No chief complaint on file.   History of Present Illness:  Rinoa Scheurer is a 65 y.o. female with history of chronic diastolic CHF, morbid obesity, OSA on CPAP  Patient last saw Dr. Marlou Porch 03/19/2020 at which time she was stable.  Was found to have a pulmonary nodule on CT and fatty liver also noted.  Weight loss recommended.  Patient comes in for yearly f/u. Having trouble getting her CPAP supplies. Denies chest pain. Chronic DOE. Has some fluid build up today but forgets to take extra diuretic. Weight up 13 lbs from last year. Hasn't worked too hard on weight loss. Not very active.      Past Medical History:  Diagnosis Date   Arthritis    knees   Asthma    Iron deficiency anemia    Morbid obesity ([place])    Rhabdomyolysis 04/2016   normal compartment pressures   Sleep apnea    uses cpap, setting is unknown, does not wear it consistently   Thyroid disease     Past Surgical History:  Procedure Laterality Date   CESAREAN SECTION     COLONOSCOPY WITH PROPOFOL N/A 07/21/2015   Procedure: COLONOSCOPY WITH PROPOFOL;  Surgeon: Juanita Craver, MD;  Location: [facility];  Service: Endoscopy;  Laterality: N/A;   DILATION AND CURETTAGE OF UTERUS     ENDOMETRIAL ABLATION     GANGLION CYST EXCISION     rt arm   SHOULDER SURGERY Left    Rotator cuff repair    Current Medications: Current Meds  Medication Sig   acetaminophen (TYLENOL) 325 MG tablet Take 650 mg by mouth every 6 (six) hours as needed (pain).    albuterol (VENTOLIN HFA) 108 (90 Base) MCG/ACT inhaler Inhale 2 puffs into the lungs every 6 (six) hours as needed for wheezing or shortness of breath  (rescue).    allopurinol (ZYLOPRIM) 100 MG tablet Take 100 mg by mouth as needed (for gout and kidney stones).   aspirin EC 81 MG EC tablet Take 1 tablet (81 mg total) by mouth daily.   atorvastatin (LIPITOR) 20 MG tablet Take 20 mg by mouth at bedtime.   colchicine 0.6 MG tablet Take 0.6 mg by mouth as needed (for gout attack).   diclofenac sodium (VOLTAREN) 1 % GEL Apply 2 g topically 2 (two) times daily as needed (pain). Applies to knees.   ferrous sulfate 325 (65 FE) MG tablet Take 325 mg by mouth 3 (three) times daily with meals.   Fluticasone-Salmeterol (ADVAIR) 250-50 MCG/DOSE AEPB Inhale 1 puff into the lungs 2 (two) times daily.   furosemide (LASIX) 40 MG tablet Take 1 tablet (40 mg total) by mouth daily.   levothyroxine (SYNTHROID) 50 MCG tablet Take 50 mcg by mouth daily.   montelukast (SINGULAIR) 10 MG tablet Take 10 mg by mouth at bedtime as needed (allergies.).    Multiple Vitamin (MULTIVITAMIN) capsule Take 1 capsule by mouth daily.   Omega-3 Fatty Acids (FISH OIL PO) Take 1 capsule by mouth daily.   potassium chloride SA (KLOR-CON) 20 MEQ tablet Take 1 tablet by mouth twice daily   VICTOZA 18 MG/3ML SOPN Inject  1.8 mg into the skin daily.     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  Tobacco Use   Smoking status: Never   Smokeless tobacco: Never  Vaping Use   Vaping Use: Never used  Substance and Sexual Activity   Alcohol use: No   Drug use: No   Sexual activity: Not Currently    Birth control/protection: None  Other Topics Concern   Not on file  Social History Narrative   Not on file   Social  Determinants of Health   Financial Resource Strain: Not on file  Food Insecurity: Not on file  Transportation Needs: Not on file  Physical Activity: Not on file  Stress: Not on file  Social Connections: Not on file     Family History:  The patient's  family history includes Breast cancer in her maternal aunt; Heart disease in her maternal grandmother and mother; Heart failure in her mother; Hypertension in her brother and maternal grandmother.   ROS:   Please see the history of present illness.    ROS All other systems reviewed and are negative.   PHYSICAL EXAM:   VS:  BP 102/62   Pulse 70   Ht '5\' 4"'$  (1.626 m)   Wt (!) 329 lb 12.8 oz (149.6 kg)   LMP 05/12/2012   SpO2 91%   BMI 56.61 kg/m   Physical Exam  GEN: Obese, in no acute distress  Neck: no JVD, carotid bruits, or masses Cardiac:RRR; no murmurs, rubs, or gallops  Respiratory:  clear to auscultation bilaterally, normal work of breathing GI: soft, nontender, nondistended, + BS Ext: without cyanosis, clubbing, or edema, Good distal pulses bilaterally Neuro:  Alert and Oriented x 3 Psych: euthymic mood, full affect  Wt Readings from Last 3 Encounters:  03/16/21 (!) 329 lb 12.8 oz (149.6 kg)  03/19/20 (!) 317 lb (143.8 kg)  02/18/20 (!) 322 lb (146.1 kg)      Studies/Labs Reviewed:   EKG:  EKG is  ordered today.  The ekg ordered today demonstrates NSR normal EKG  Recent Labs: No results found for requested labs within last 8760 hours.   Lipid Panel    Component Value Date/Time   CHOL 151 03/14/2017 0427   TRIG 107 03/14/2017 0427   HDL 40 (L) 03/14/2017 0427   CHOLHDL 3.8 03/14/2017 0427   VLDL 21 03/14/2017 0427   LDLCALC 90 03/14/2017 0427    Additional studies/ records that were reviewed today include:  Echo in 2018 showed EF of 65% with grade 2 diastolic dysfunction and mildly D-shaped interventricular septum suggestive of RV volume overload.    Risk Assessment/Calculations:          ASSESSMENT:    1. Chronic diastolic CHF (congestive heart failure) ([place])   2. OSA (obstructive sleep apnea)   3. Morbid obesity ([place])   4. Hyperlipidemia, unspecified hyperlipidemia type      PLAN:  In order of problems listed above:  Chronic diastolic CHF-has extra fluid on board-can take extra lasix/potassium -2 gram sodium diet.  OSA on CPAP-having trouble with getting supplies, doesn't know who manages. She does benefit  from CPAP. Will check with Gae Bon. She sees Dr. Elsworth Soho for pulmonary so he may be able to manage.  Morbid obesity-continues to gain weight. Discussed different weight loss programs such as weight watchers. She is also considering R3 diet.  150 min exercise  HLD on crestor had blood work last week by [employer] try to get   Shared Decision Making/Informed Consent        Medication Adjustments/Labs and Tests Ordered: Current medicines are reviewed at length with the patient today.  Concerns regarding medicines are outlined above.  Medication changes, Labs and Tests ordered today are listed in the Patient Instructions below. Patient Instructions  Medication Instructions:  Your physician recommends that you continue on your current medications as directed. Please refer to the Current Medication list given to you today.  *If you need a refill on your cardiac medications before your next appointment, please call your pharmacy*   Lab Work: NONE If you have labs (blood work) drawn today and your tests are completely normal, you will receive your results only by: [place] (if you have MyChart) OR A paper copy in the mail If you have any lab test that is abnormal or we need to change your treatment, we will call you to review the results.   Testing/Procedures: NONE   Follow-Up: At [facility], you and your health needs are our priority.  As part of our continuing mission to provide you with exceptional heart care, we have created designated  Provider Care Teams.  These Care Teams include your primary Cardiologist (physician) and Advanced Practice Providers (APPs -  Physician Assistants and Nurse Practitioners) who all work together to provide you with the care you need, when you need it.  We recommend signing up for the patient portal called "MyChart".  Sign up information is provided on this After Visit Summary.  MyChart is used to connect with patients for Virtual Visits (Telemedicine).  Patients are able to view lab/test results, encounter notes, upcoming appointments, etc.  Non-urgent messages can be sent to your provider as well.   To learn more about what you can do with MyChart, go to [email or web address].    Your next appointment:   1 year(s)  The format for your next appointment:   In Person  Provider:   You may see Candee Furbish, MD or one of the following Advanced Practice Providers on your designated Care Team:   Cecilie Kicks, NP    Other Instructions  Two Gram Sodium Diet 2000 mg  What is Sodium? Sodium is a mineral found naturally in many foods. The most significant source of sodium in the diet is table salt, which is about 40% sodium.  Processed, convenience, and preserved foods also contain a large amount of sodium.  The body needs only 500 mg of sodium daily to function,  A normal diet provides more than enough sodium even if you do not use salt.  Why Limit Sodium? A build up of sodium in the body can cause thirst, increased blood pressure, shortness of breath, and water retention.  Decreasing sodium in the diet can reduce edema and risk of heart attack or stroke associated with high blood pressure.  Keep in mind that there are many other factors involved in these health problems.  Heredity, obesity, lack of exercise, cigarette smoking, stress and what you eat all play a role.  General Guidelines: Do not add salt at the table or in cooking.  One teaspoon of salt contains over 2 grams of  sodium. Read food  labels Avoid processed and convenience foods Ask your dietitian before eating any foods not dicussed in the menu planning guidelines Consult your physician if you wish to use a salt substitute or a sodium containing medication such as antacids.  Limit milk and milk products to 16 oz (2 cups) per day.  Shopping Hints: READ LABELS!! "Dietetic" does not necessarily mean low sodium. Salt and other sodium ingredients are often added to foods during processing.    Menu Planning Guidelines Food Group Choose More Often Avoid  Beverages (see also the milk group All fruit juices, low-sodium, salt-free vegetables juices, low-sodium carbonated beverages Regular vegetable or tomato juices, commercially softened water used for drinking or cooking  Breads and Cereals Enriched white, wheat, rye and pumpernickel bread, hard rolls and dinner rolls; muffins, cornbread and waffles; most dry cereals, cooked cereal without added salt; unsalted crackers and breadsticks; low sodium or homemade bread crumbs Bread, rolls and crackers with salted tops; quick breads; instant hot cereals; pancakes; commercial bread stuffing; self-rising flower and biscuit mixes; regular bread crumbs or cracker crumbs  Desserts and Sweets Desserts and sweets mad with mild should be within allowance Instant pudding mixes and cake mixes  Fats Butter or margarine; vegetable oils; unsalted salad dressings, regular salad dressings limited to 1 Tbs; light, sour and heavy cream Regular salad dressings containing bacon fat, bacon bits, and salt pork; snack dips made with instant soup mixes or processed cheese; salted nuts  Fruits Most fresh, frozen and canned fruits Fruits processed with salt or sodium-containing ingredient (some dried fruits are processed with sodium sulfites        Vegetables Fresh, frozen vegetables and low- sodium canned vegetables Regular canned vegetables, sauerkraut, pickled vegetables, and others prepared in brine; frozen  vegetables in sauces; vegetables seasoned with ham, bacon or salt pork  Condiments, Sauces, Miscellaneous  Salt substitute with physician's approval; pepper, herbs, spices; vinegar, lemon or lime juice; hot pepper sauce; garlic powder, onion powder, low sodium soy sauce (1 Tbs.); low sodium condiments (ketchup, chili sauce, mustard) in limited amounts (1 tsp.) fresh ground horseradish; unsalted tortilla chips, pretzels, potato chips, popcorn, salsa (1/4 cup) Any seasoning made with salt including garlic salt, celery salt, onion salt, and seasoned salt; sea salt, rock salt, kosher salt; meat tenderizers; monosodium glutamate; mustard, regular soy sauce, barbecue, sauce, chili sauce, teriyaki sauce, steak sauce, Worcestershire sauce, and most flavored vinegars; canned gravy and mixes; regular condiments; salted snack foods, olives, picles, relish, horseradish sauce, catsup   Food preparation: Try these seasonings Meats:    Pork Sage, onion Serve with applesauce  Chicken Poultry seasoning, thyme, parsley Serve with cranberry sauce  Lamb Curry powder, rosemary, garlic, thyme Serve with mint sauce or jelly  Veal Marjoram, basil Serve with current jelly, cranberry sauce  Beef Pepper, bay leaf Serve with dry mustard, unsalted chive butter  Fish [place] leaf, dill Serve with unsalted lemon butter, unsalted parsley butter  Vegetables:    Asparagus Lemon juice   Broccoli Lemon juice   Carrots Mustard dressing parsley, mint, nutmeg, glazed with unsalted butter and sugar   Green beans Marjoram, lemon juice, nutmeg,dill seed   Tomatoes Basil, marjoram, onion   Spice /blend for [employer]" 4 tsp ground thyme 1 tsp ground sage 3 tsp ground rosemary 4 tsp ground marjoram   Test your knowledge A product that says "Salt Free" may still contain sodium. True or False Garlic Powder and Hot Pepper Sauce an be used as alternative seasonings.True or  False Processed foods have more sodium than fresh foods.  True or  False Canned Vegetables have less sodium than froze True or False   WAYS TO DECREASE YOUR SODIUM INTAKE Avoid the use of added salt in cooking and at the table.  Table salt (and other prepared seasonings which contain salt) is probably one of the greatest sources of sodium in the diet.  Unsalted foods can gain flavor from the sweet, sour, and butter taste sensations of herbs and spices.  Instead of using salt for seasoning, try the following seasonings with the foods listed.  Remember: how you use them to enhance natural food flavors is limited only by your creativity... Allspice-Meat, fish, eggs, fruit, peas, red and yellow vegetables Almond Extract-Fruit baked goods Anise Seed-Sweet breads, fruit, carrots, beets, cottage cheese, cookies (tastes like licorice) Basil-Meat, fish, eggs, vegetables, rice, vegetables salads, soups, sauces Bay Leaf-Meat, fish, stews, poultry Burnet-Salad, vegetables (cucumber-like flavor) Caraway Seed-Bread, cookies, cottage cheese, meat, vegetables, cheese, rice Cardamon-Baked goods, fruit, soups Celery Powder or seed-Salads, salad dressings, sauces, meatloaf, soup, bread.Do not use  celery salt Chervil-Meats, salads, fish, eggs, vegetables, cottage cheese (parsley-like flavor) Chili Power-Meatloaf, chicken cheese, corn, eggplant, egg dishes Chives-Salads cottage cheese, egg dishes, soups, vegetables, sauces Cilantro-Salsa, casseroles Cinnamon-Baked goods, fruit, pork, lamb, chicken, carrots Cloves-Fruit, baked goods, fish, pot roast, green beans, beets, carrots Coriander-Pastry, cookies, meat, salads, cheese (lemon-orange flavor) Cumin-Meatloaf, fish,cheese, eggs, cabbage,fruit pie (caraway flavor) [employer], fruit, eggs, fish, poultry, cottage cheese, vegetables Dill Seed-Meat, cottage cheese, poultry, vegetables, fish, salads, bread Fennel Seed-Bread, cookies, apples, pork, eggs, fish, beets, cabbage, cheese, Licorice-like flavor Garlic-(buds or  powder) Salads, meat, poultry, fish, bread, butter, vegetables, potatoes.Do not  use garlic salt Ginger-Fruit, vegetables, baked goods, meat, fish, poultry Horseradish Root-Meet, vegetables, butter Lemon Juice or Extract-Vegetables, fruit, tea, baked goods, fish salads Mace-Baked goods fruit, vegetables, fish, poultry (taste like nutmeg) Maple Extract-Syrups Marjoram-Meat, chicken, fish, vegetables, breads, green salads (taste like Sage) Mint-Tea, lamb, sherbet, vegetables, desserts, carrots, cabbage Mustard, Dry or Seed-Cheese, eggs, meats, vegetables, poultry Nutmeg-Baked goods, fruit, chicken, eggs, vegetables, desserts Onion Powder-Meat, fish, poultry, vegetables, cheese, eggs, bread, rice salads (Do not use   Onion salt) Orange Extract-Desserts, baked goods Oregano-Pasta, eggs, cheese, onions, pork, lamb, fish, chicken, vegetables, green salads Paprika-Meat, fish, poultry, eggs, cheese, vegetables Parsley Flakes-Butter, vegetables, meat fish, poultry, eggs, bread, salads (certain forms may   Contain sodium Pepper-Meat fish, poultry, vegetables, eggs Peppermint Extract-Desserts, baked goods Poppy Seed-Eggs, bread, cheese, fruit dressings, baked goods, noodles, vegetables, cottage  [employer], poultry, meat, fish, cauliflower, turnips,eggs bread Saffron-Rice, bread, veal, chicken, fish, eggs Sage-Meat, fish, poultry, onions, eggplant, tomateos, pork, stews Savory-Eggs, salads, poultry, meat, rice, vegetables, soups, pork Tarragon-Meat, poultry, fish, eggs, butter, vegetables (licorice-like flavor)  Thyme-Meat, poultry, fish, eggs, vegetables, (clover-like flavor), sauces, soups Tumeric-Salads, butter, eggs, fish, rice, vegetables (saffron-like flavor) Vanilla Extract-Baked goods, candy Vinegar-Salads, vegetables, meat marinades Walnut Extract-baked goods, candy   2. Choose your Foods Wisely   The following is a list of foods to avoid  which are high in sodium:  Meats-Avoid all smoked, canned, salt cured, dried and kosher meat and fish as well as Anchovies   [employer] meats:Bologna, Liverwurst, Pastrami Canned meat or fish  Marinated herring Caviar    Pepperoni Corned Beef   Pizza Dried chipped beef  Salami Frozen breaded fish or meat Salt pork Frankfurters or hot dogs  Sardines Gefilte fish   Sausage Ham (boiled ham, Proscuitto Smoked butt  spiced ham)   Spam      TV Dinners Vegetables Canned vegetables (Regular) Relish Canned mushrooms  Sauerkraut Olives    Tomato juice Pickles  Bakery and Dessert Products Canned puddings  Cream pies Cheesecake   Decorated cakes Cookies  Beverages/Juices Tomato juice, regular  Gatorade   V-8 vegetable juice, regular  Breads and Cereals Biscuit mixes   Salted potato chips, corn chips, pretzels Bread stuffing mixes  Salted crackers and rolls Pancake and waffle mixes Self-rising flour  Seasonings Accent    Meat sauces Barbecue sauce  Meat tenderizer Catsup    Monosodium glutamate (MSG) Celery salt   Onion salt Chili sauce   Prepared mustard Garlic salt   Salt, seasoned salt, sea salt Gravy mixes   Soy sauce Horseradish   Steak sauce Ketchup   Tartar sauce Lite salt    Teriyaki sauce Marinade mixes   Worcestershire sauce  Others Baking powder   Cocoa and cocoa mixes Baking soda   Commercial casserole mixes Candy-caramels, chocolate  Dehydrated soups    Bars, fudge,nougats  Instant rice and pasta mixes Canned broth or soup  Maraschino cherries Cheese, aged and processed cheese and cheese spreads  Learning Assessment Quiz  Indicated T (for True) or F (for False) for each of the following statements:  _____ Fresh fruits and vegetables and unprocessed grains are generally low in sodium _____ Water may contain a considerable amount of sodium, depending on the source _____ You can always tell if a food is high in sodium by tasting  it _____ Certain laxatives my be high in sodium and should be avoided unless prescribed   by a physician or pharmacist _____ Salt substitutes may be used freely by anyone on a sodium restricted diet _____ Sodium is present in table salt, food additives and as a natural component of   most foods _____ Table salt is approximately 90% sodium _____ Limiting sodium intake may help prevent excess fluid accumulation in the body _____ On a sodium-restricted diet, seasonings such as bouillon soy sauce, and    cooking wine should be used in place of table salt _____ On an ingredient list, a product which lists monosodium glutamate as the first   ingredient is an appropriate food to include on a low sodium diet  Circle the best answer(s) to the following statements (Hint: there may be more than one correct answer)  11. On a low-sodium diet, some acceptable snack items are:    A. Olives  F. Bean dip   K. Grapefruit juice    B. Salted Pretzels G. Commercial Popcorn   L. Canned peaches    C. Carrot Sticks  H. Bouillon   M. Unsalted nuts   D. [place] fries  I. Peanut butter crackers N. Salami   E. Sweet pickles J. Tomato Juice   O. Pizza  12.  Seasonings that may be used freely on a reduced - sodium diet include   A. Lemon wedges F.Monosodium glutamate K. Celery seed    B.Soysauce   G. Pepper   L. Mustard powder   C. Sea salt  H. Cooking wine  M. Onion flakes   D. Vinegar  E. Prepared horseradish N. Salsa   E. Sage   J. Worcestershire sauce  O. Chutney   YOUR PROVIDER RECOMMENDS THAT YOU EXERCISE FOR AT LEAST 150 MINS WEEKLY AND LOOK INTO A WEIGHT LOSS PROGRAM.       Signed, Ermalinda Barrios, PA-C  03/16/2021 11:02 AM    [facility] [address], [place], [place]  [address] Phone: [phone]; Fax: [phone]

## 2021-03-16 ENCOUNTER — Encounter: Payer: Self-pay | Admitting: Physician Assistant

## 2021-03-16 ENCOUNTER — Other Ambulatory Visit: Payer: Self-pay

## 2021-03-16 ENCOUNTER — Ambulatory Visit (INDEPENDENT_AMBULATORY_CARE_PROVIDER_SITE_OTHER): Payer: Medicare Other | Admitting: Physician Assistant

## 2021-03-16 VITALS — BP 102/62 | HR 70 | Ht 64.0 in | Wt 329.8 lb

## 2021-03-16 DIAGNOSIS — E785 Hyperlipidemia, unspecified: Secondary | ICD-10-CM

## 2021-03-16 DIAGNOSIS — I5032 Chronic diastolic (congestive) heart failure: Secondary | ICD-10-CM | POA: Diagnosis not present

## 2021-03-16 DIAGNOSIS — G4733 Obstructive sleep apnea (adult) (pediatric): Secondary | ICD-10-CM

## 2021-03-16 NOTE — Patient Instructions (Signed)
Medication Instructions:  Your physician recommends that you continue on your current medications as directed. Please refer to the Current Medication list given to you today.  *If you need a refill on your cardiac medications before your next appointment, please call your pharmacy*   Lab Work: NONE If you have labs (blood work) drawn today and your tests are completely normal, you will receive your results only by: [place] (if you have MyChart) OR A paper copy in the mail If you have any lab test that is abnormal or we need to change your treatment, we will call you to review the results.   Testing/Procedures: NONE   Follow-Up: At [facility], you and your health needs are our priority.  As part of our continuing mission to provide you with exceptional heart care, we have created designated Provider Care Teams.  These Care Teams include your primary Cardiologist (physician) and Advanced Practice Providers (APPs -  Physician Assistants and Nurse Practitioners) who all work together to provide you with the care you need, when you need it.  We recommend signing up for the patient portal called "MyChart".  Sign up information is provided on this After Visit Summary.  MyChart is used to connect with patients for Virtual Visits (Telemedicine).  Patients are able to view lab/test results, encounter notes, upcoming appointments, etc.  Non-urgent messages can be sent to your provider as well.   To learn more about what you can do with MyChart, go to [email or web address].    Your next appointment:   1 year(s)  The format for your next appointment:   In Person  Provider:   You may see Candee Furbish, MD or one of the following Advanced Practice Providers on your designated Care Team:   Cecilie Kicks, NP    Other Instructions  Two Gram Sodium Diet 2000 mg  What is Sodium? Sodium is a mineral found naturally in many foods. The most significant source of sodium in the diet is table  salt, which is about 40% sodium.  Processed, convenience, and preserved foods also contain a large amount of sodium.  The body needs only 500 mg of sodium daily to function,  A normal diet provides more than enough sodium even if you do not use salt.  Why Limit Sodium? A build up of sodium in the body can cause thirst, increased blood pressure, shortness of breath, and water retention.  Decreasing sodium in the diet can reduce edema and risk of heart attack or stroke associated with high blood pressure.  Keep in mind that there are many other factors involved in these health problems.  Heredity, obesity, lack of exercise, cigarette smoking, stress and what you eat all play a role.  General Guidelines: Do not add salt at the table or in cooking.  One teaspoon of salt contains over 2 grams of sodium. Read food labels Avoid processed and convenience foods Ask your dietitian before eating any foods not dicussed in the menu planning guidelines Consult your physician if you wish to use a salt substitute or a sodium containing medication such as antacids.  Limit milk and milk products to 16 oz (2 cups) per day.  Shopping Hints: READ LABELS!! "Dietetic" does not necessarily mean low sodium. Salt and other sodium ingredients are often added to foods during processing.    Menu Planning Guidelines Food Group Choose More Often Avoid  Beverages (see also the milk group All fruit juices, low-sodium, salt-free vegetables juices, low-sodium carbonated beverages Regular vegetable or  tomato juices, commercially softened water used for drinking or cooking  Breads and Cereals Enriched white, wheat, rye and pumpernickel bread, hard rolls and dinner rolls; muffins, cornbread and waffles; most dry cereals, cooked cereal without added salt; unsalted crackers and breadsticks; low sodium or homemade bread crumbs Bread, rolls and crackers with salted tops; quick breads; instant hot cereals; pancakes; commercial bread  stuffing; self-rising flower and biscuit mixes; regular bread crumbs or cracker crumbs  Desserts and Sweets Desserts and sweets mad with mild should be within allowance Instant pudding mixes and cake mixes  Fats Butter or margarine; vegetable oils; unsalted salad dressings, regular salad dressings limited to 1 Tbs; light, sour and heavy cream Regular salad dressings containing bacon fat, bacon bits, and salt pork; snack dips made with instant soup mixes or processed cheese; salted nuts  Fruits Most fresh, frozen and canned fruits Fruits processed with salt or sodium-containing ingredient (some dried fruits are processed with sodium sulfites        Vegetables Fresh, frozen vegetables and low- sodium canned vegetables Regular canned vegetables, sauerkraut, pickled vegetables, and others prepared in brine; frozen vegetables in sauces; vegetables seasoned with ham, bacon or salt pork  Condiments, Sauces, Miscellaneous  Salt substitute with physician's approval; pepper, herbs, spices; vinegar, lemon or lime juice; hot pepper sauce; garlic powder, onion powder, low sodium soy sauce (1 Tbs.); low sodium condiments (ketchup, chili sauce, mustard) in limited amounts (1 tsp.) fresh ground horseradish; unsalted tortilla chips, pretzels, potato chips, popcorn, salsa (1/4 cup) Any seasoning made with salt including garlic salt, celery salt, onion salt, and seasoned salt; sea salt, rock salt, kosher salt; meat tenderizers; monosodium glutamate; mustard, regular soy sauce, barbecue, sauce, chili sauce, teriyaki sauce, steak sauce, Worcestershire sauce, and most flavored vinegars; canned gravy and mixes; regular condiments; salted snack foods, olives, picles, relish, horseradish sauce, catsup   Food preparation: Try these seasonings Meats:    Pork Sage, onion Serve with applesauce  Chicken Poultry seasoning, thyme, parsley Serve with cranberry sauce  Lamb Curry powder, rosemary, garlic, thyme Serve with mint sauce  or jelly  Veal Marjoram, basil Serve with current jelly, cranberry sauce  Beef Pepper, bay leaf Serve with dry mustard, unsalted chive butter  [place] leaf, dill Serve with unsalted lemon butter, unsalted parsley butter  Vegetables:    Asparagus Lemon juice   Broccoli Lemon juice   Carrots Mustard dressing parsley, mint, nutmeg, glazed with unsalted butter and sugar   Green beans Marjoram, lemon juice, nutmeg,dill seed   Tomatoes Basil, marjoram, onion   Spice /blend for [employer]" 4 tsp ground thyme 1 tsp ground sage 3 tsp ground rosemary 4 tsp ground marjoram   Test your knowledge A product that says "Salt Free" may still contain sodium. True or False Garlic Powder and Hot Pepper Sauce an be used as alternative seasonings.True or False Processed foods have more sodium than fresh foods.  True or False Canned Vegetables have less sodium than froze True or False   WAYS TO DECREASE YOUR SODIUM INTAKE Avoid the use of added salt in cooking and at the table.  Table salt (and other prepared seasonings which contain salt) is probably one of the greatest sources of sodium in the diet.  Unsalted foods can gain flavor from the sweet, sour, and butter taste sensations of herbs and spices.  Instead of using salt for seasoning, try the following seasonings with the foods listed.  Remember: how you use them to enhance natural food flavors is limited  only by your creativity... Allspice-Meat, fish, eggs, fruit, peas, red and yellow vegetables Almond Extract-Fruit baked goods Anise Seed-Sweet breads, fruit, carrots, beets, cottage cheese, cookies (tastes like licorice) Basil-Meat, fish, eggs, vegetables, rice, vegetables salads, soups, sauces Bay Leaf-Meat, fish, stews, poultry Burnet-Salad, vegetables (cucumber-like flavor) Caraway Seed-Bread, cookies, cottage cheese, meat, vegetables, cheese, rice Cardamon-Baked goods, fruit, soups Celery Powder or seed-Salads, salad dressings, sauces, meatloaf,  soup, bread.Do not use  celery salt Chervil-Meats, salads, fish, eggs, vegetables, cottage cheese (parsley-like flavor) Chili Power-Meatloaf, chicken cheese, corn, eggplant, egg dishes Chives-Salads cottage cheese, egg dishes, soups, vegetables, sauces Cilantro-Salsa, casseroles Cinnamon-Baked goods, fruit, pork, lamb, chicken, carrots Cloves-Fruit, baked goods, fish, pot roast, green beans, beets, carrots Coriander-Pastry, cookies, meat, salads, cheese (lemon-orange flavor) Cumin-Meatloaf, fish,cheese, eggs, cabbage,fruit pie (caraway flavor) [employer], fruit, eggs, fish, poultry, cottage cheese, vegetables Dill Seed-Meat, cottage cheese, poultry, vegetables, fish, salads, bread Fennel Seed-Bread, cookies, apples, pork, eggs, fish, beets, cabbage, cheese, Licorice-like flavor Garlic-(buds or powder) Salads, meat, poultry, fish, bread, butter, vegetables, potatoes.Do not  use garlic salt Ginger-Fruit, vegetables, baked goods, meat, fish, poultry Horseradish Root-Meet, vegetables, butter Lemon Juice or Extract-Vegetables, fruit, tea, baked goods, fish salads Mace-Baked goods fruit, vegetables, fish, poultry (taste like nutmeg) Maple Extract-Syrups Marjoram-Meat, chicken, fish, vegetables, breads, green salads (taste like Sage) Mint-Tea, lamb, sherbet, vegetables, desserts, carrots, cabbage Mustard, Dry or Seed-Cheese, eggs, meats, vegetables, poultry Nutmeg-Baked goods, fruit, chicken, eggs, vegetables, desserts Onion Powder-Meat, fish, poultry, vegetables, cheese, eggs, bread, rice salads (Do not use   Onion salt) Orange Extract-Desserts, baked goods Oregano-Pasta, eggs, cheese, onions, pork, lamb, fish, chicken, vegetables, green salads Paprika-Meat, fish, poultry, eggs, cheese, vegetables Parsley Flakes-Butter, vegetables, meat fish, poultry, eggs, bread, salads (certain forms may   Contain sodium Pepper-Meat fish, poultry, vegetables, eggs Peppermint Extract-Desserts, baked  goods Poppy Seed-Eggs, bread, cheese, fruit dressings, baked goods, noodles, vegetables, cottage  [employer], poultry, meat, fish, cauliflower, turnips,eggs bread Saffron-Rice, bread, veal, chicken, fish, eggs Sage-Meat, fish, poultry, onions, eggplant, tomateos, pork, stews Savory-Eggs, salads, poultry, meat, rice, vegetables, soups, pork Tarragon-Meat, poultry, fish, eggs, butter, vegetables (licorice-like flavor)  Thyme-Meat, poultry, fish, eggs, vegetables, (clover-like flavor), sauces, soups Tumeric-Salads, butter, eggs, fish, rice, vegetables (saffron-like flavor) Vanilla Extract-Baked goods, candy Vinegar-Salads, vegetables, meat marinades Walnut Extract-baked goods, candy   2. Choose your Foods Wisely   The following is a list of foods to avoid which are high in sodium:  Meats-Avoid all smoked, canned, salt cured, dried and kosher meat and fish as well as Anchovies   [employer] meats:Bologna, Liverwurst, Pastrami Canned meat or fish  Marinated herring Caviar    Pepperoni Corned Beef   Pizza Dried chipped beef  Salami Frozen breaded fish or meat Salt pork Frankfurters or hot dogs  Sardines Gefilte fish   Sausage Ham (boiled ham, Proscuitto Smoked butt    spiced ham)   Spam      TV Dinners Vegetables Canned vegetables (Regular) Relish Canned mushrooms  Sauerkraut Olives    Tomato juice Pickles  Bakery and Dessert Products Canned puddings  Cream pies Cheesecake   Decorated cakes Cookies  Beverages/Juices Tomato juice, regular  Gatorade   V-8 vegetable juice, regular  Breads and Cereals Biscuit mixes   Salted potato chips, corn chips, pretzels Bread stuffing mixes  Salted crackers and rolls Pancake and waffle mixes Self-rising flour  Seasonings Accent    Meat sauces Barbecue sauce  Meat tenderizer Catsup    Monosodium glutamate (MSG) Celery salt   Onion  salt Chili sauce   Prepared mustard Garlic  salt   Salt, seasoned salt, sea salt Gravy mixes   Soy sauce Horseradish   Steak sauce Ketchup   Tartar sauce Lite salt    Teriyaki sauce Marinade mixes   Worcestershire sauce  Others Baking powder   Cocoa and cocoa mixes Baking soda   Commercial casserole mixes Candy-caramels, chocolate  Dehydrated soups    Bars, fudge,nougats  Instant rice and pasta mixes Canned broth or soup  Maraschino cherries Cheese, aged and processed cheese and cheese spreads  Learning Assessment Quiz  Indicated T (for True) or F (for False) for each of the following statements:  _____ Fresh fruits and vegetables and unprocessed grains are generally low in sodium _____ Water may contain a considerable amount of sodium, depending on the source _____ You can always tell if a food is high in sodium by tasting it _____ Certain laxatives my be high in sodium and should be avoided unless prescribed   by a physician or pharmacist _____ Salt substitutes may be used freely by anyone on a sodium restricted diet _____ Sodium is present in table salt, food additives and as a natural component of   most foods _____ Table salt is approximately 90% sodium _____ Limiting sodium intake may help prevent excess fluid accumulation in the body _____ On a sodium-restricted diet, seasonings such as bouillon soy sauce, and    cooking wine should be used in place of table salt _____ On an ingredient list, a product which lists monosodium glutamate as the first   ingredient is an appropriate food to include on a low sodium diet  Circle the best answer(s) to the following statements (Hint: there may be more than one correct answer)  11. On a low-sodium diet, some acceptable snack items are:    A. Olives  F. Bean dip   K. Grapefruit juice    B. Salted Pretzels G. Commercial Popcorn   L. Canned peaches    C. Carrot Sticks  H. Bouillon   M. Unsalted nuts   D. [place] fries  I. Peanut butter crackers N. Salami   E. Sweet pickles J.  Tomato Juice   O. Pizza  12.  Seasonings that may be used freely on a reduced - sodium diet include   A. Lemon wedges F.Monosodium glutamate K. Celery seed    B.Soysauce   G. Pepper   L. Mustard powder   C. Sea salt  H. Cooking wine  M. Onion flakes   D. Vinegar  E. Prepared horseradish N. Salsa   E. Sage   J. Worcestershire sauce  O. Chutney   YOUR PROVIDER RECOMMENDS THAT YOU EXERCISE FOR AT LEAST 150 MINS WEEKLY AND LOOK INTO A WEIGHT LOSS PROGRAM.

## 2021-04-05 DIAGNOSIS — Z6841 Body Mass Index (BMI) 40.0 and over, adult: Secondary | ICD-10-CM | POA: Insufficient documentation

## 2021-04-28 ENCOUNTER — Ambulatory Visit (INDEPENDENT_AMBULATORY_CARE_PROVIDER_SITE_OTHER): Payer: Medicare Other | Admitting: Cardiovascular Disease

## 2021-04-28 ENCOUNTER — Other Ambulatory Visit: Payer: Self-pay

## 2021-04-28 ENCOUNTER — Encounter: Payer: Self-pay | Admitting: Cardiovascular Disease

## 2021-04-28 VITALS — BP 114/66 | HR 69 | Ht 64.0 in | Wt 331.4 lb

## 2021-04-28 DIAGNOSIS — G4733 Obstructive sleep apnea (adult) (pediatric): Secondary | ICD-10-CM

## 2021-04-28 DIAGNOSIS — E118 Type 2 diabetes mellitus with unspecified complications: Secondary | ICD-10-CM

## 2021-04-28 DIAGNOSIS — I5032 Chronic diastolic (congestive) heart failure: Secondary | ICD-10-CM | POA: Diagnosis not present

## 2021-04-28 DIAGNOSIS — E785 Hyperlipidemia, unspecified: Secondary | ICD-10-CM

## 2021-04-28 NOTE — Patient Instructions (Signed)

## 2021-04-28 NOTE — Progress Notes (Signed)
Cardiology Office Note    Date:  05/05/2021   ID:  Theresa Kirby, DOB 1956/04/15, MRN [id number]  PCP:  Vassie Moment, MD (Inactive)  Cardiologist:  Shelva Majestic, MD (sleep); Dr. Candee Furbish  New sleep evaluation   History of Present Illness:  Theresa Kirby is a 65 y.o. female who is followed by Dr. Marlou Porch for cardiology care.  She has a history of chronic diastolic CHF, morbid obesity, as well as obstructive sleep apnea.  From a sleep perspective, she has been followed by Dr. Ashby Dawes and had undergone a initial sleep evaluation at [facility] in [employer] on May 30, 2013.  This study reveals moderate overall sleep apnea with an AHI of 18.2.  She had reduced sleep efficiency at 69.4%.  She had significant oxygen desaturation reportedly to a minimum of 51%.  Apparently, she was started on CPAP therapy in 2014.  She mainly received a new ResMed air sense 10 AutoSet unit on May 12, 2017 which was her second machine.  She no longer sees Dr. Loreen Freud on and is now referred to me for sleep medicine follow-up.  We were able to link her machine to our office and obtain a download from September 5 through April 27, 2021.  Her settings are currently at a range of 4 to 20 cm of water.  Her 95th percentile pressure is 12.3 with a maximum average pressure of 14.0.  Compliance is excellent with 100% of usage days and average use however is adequate for compliance  but insufficient at only 5 hours and 30 minutes.  Diet is excellent at 0.3.  She typically goes to bed between 1 and 2 AM and wakes up at approximately 8 AM.  Theresa Kirby in need of a new mask.  An Epworth Sleepiness Scale score was calculated in the office today and this endorsed at 13 as shown below:  Epworth Sleepiness Scale: Situation   Chance of Dozing/Sleeping (0 = never , 1 = slight chance , 2 = moderate chance , 3 = high chance )   sitting and reading 3   watching TV 2   sitting inactive in a public place  0   being a passenger in a motor vehicle for an hour or more 1   lying down in the afternoon 3   sitting and talking to someone 1   sitting quietly after lunch (no alcohol) 3   while stopped for a few minutes in traffic as the driver 0   Total Score  13    She is unaware of any breakthrough snoring.  She denies any bruxism or awareness of restless legs.  There is no sleep paralysis, hypnagogic hallucinations or cataplectic events.   Past Medical History:  Diagnosis Date   Arthritis    knees   Asthma    Iron deficiency anemia    Morbid obesity ([place])    Rhabdomyolysis 04/2016   normal compartment pressures   Sleep apnea    uses cpap, setting is unknown, does not wear it consistently   Thyroid disease     Past Surgical History:  Procedure Laterality Date   CESAREAN SECTION     COLONOSCOPY WITH PROPOFOL N/A 07/21/2015   Procedure: COLONOSCOPY WITH PROPOFOL;  Surgeon: Juanita Craver, MD;  Location: [facility];  Service: Endoscopy;  Laterality: N/A;   DILATION AND CURETTAGE OF UTERUS     ENDOMETRIAL ABLATION     GANGLION CYST EXCISION     rt arm  SHOULDER SURGERY Left    Rotator cuff repair    Current Medications: Outpatient Medications Prior to Visit  Medication Sig Dispense Refill   acetaminophen (TYLENOL) 325 MG tablet Take 650 mg by mouth every 6 (six) hours as needed (pain).      albuterol (VENTOLIN HFA) 108 (90 Base) MCG/ACT inhaler Inhale 2 puffs into the lungs every 6 (six) hours as needed for wheezing or shortness of breath (rescue).      allopurinol (ZYLOPRIM) 100 MG tablet Take 100 mg by mouth as needed (for gout and kidney stones).     aspirin EC 81 MG EC tablet Take 1 tablet (81 mg total) by mouth daily.     atorvastatin (LIPITOR) 20 MG tablet Take 20 mg by mouth at bedtime.     colchicine 0.6 MG tablet Take 0.6 mg by mouth as needed (for gout attack).     diclofenac sodium (VOLTAREN) 1 % GEL Apply 2 g topically 2 (two) times daily as needed (pain). Applies to  knees.     ferrous sulfate 325 (65 FE) MG tablet Take 325 mg by mouth 3 (three) times daily with meals.     Fluticasone-Salmeterol (ADVAIR) 250-50 MCG/DOSE AEPB Inhale 1 puff into the lungs 2 (two) times daily.     furosemide (LASIX) 40 MG tablet Take 1 tablet (40 mg total) by mouth daily. 90 tablet 2   levothyroxine (SYNTHROID) 50 MCG tablet Take 50 mcg by mouth daily.     montelukast (SINGULAIR) 10 MG tablet Take 10 mg by mouth at bedtime as needed (allergies.).      Multiple Vitamin (MULTIVITAMIN) capsule Take 1 capsule by mouth daily.     Omega-3 Fatty Acids (FISH OIL PO) Take 1 capsule by mouth daily.     potassium chloride SA (KLOR-CON) 20 MEQ tablet Take 1 tablet by mouth twice daily 180 tablet 1   VICTOZA 18 MG/3ML SOPN Inject 1.8 mg into the skin daily.     No facility-administered medications prior to visit.     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  Tobacco Use   Smoking status: Never   Smokeless tobacco: Never  Vaping Use   Vaping Use: Never used  Substance and Sexual Activity   Alcohol use: No   Drug use: No   Sexual activity: Not Currently    Birth control/protection: None  Other Topics Concern   Not on file  Social History Narrative   Not on file   Social Determinants of Health   Financial Resource Strain: Not on file  Food Insecurity: Not on file  Transportation Needs: Not on file  Physical Activity: Not on file  Stress: Not on file  Social Connections: Not on file     Family History:  The patient's family history includes Breast cancer in her maternal aunt; Heart disease in her maternal  grandmother and mother; Heart failure in her mother; Hypertension in her brother and maternal grandmother.   ROS General: Negative; No fevers, chills, or night sweats; morbid obesity HEENT: Negative; No changes in vision or hearing, sinus congestion, difficulty swallowing Pulmonary: Negative; No cough, wheezing, shortness of  breath, hemoptysis Cardiovascular: Chronic diastolic heart failure GI: Negative; No nausea, vomiting, diarrhea, or abdominal pain GU: Negative; No dysuria, hematuria, or difficulty voiding Musculoskeletal: Negative; no myalgias, joint pain, or weakness Hematologic/Oncology: Negative; no easy bruising, bleeding Endocrine: Negative; no heat/cold intolerance; no diabetes Neuro: Negative; no changes in balance, headaches Skin: Negative; No rashes or skin lesions Psychiatric: Negative; No behavioral problems, depression Sleep: see HPI Other comprehensive 14 point system review is negative.   PHYSICAL EXAM:   VS:  BP 114/66   Pulse 69   Ht _0  (1.626 m)   Wt (!) 331 lb 6.4 oz (150.3 kg)   LMP 05/12/2012   SpO2 93%   BMI 56.88 kg/m     Repeat blood pressure was 110/68  Wt Readings from Last 3 Encounters:  04/28/21 (!) 331 lb 6.4 oz (150.3 kg)  03/16/21 (!) 329 lb 12.8 oz (149.6 kg)  03/19/20 (!) 317 lb (143.8 kg)    General: Alert, oriented, no distress.  Super morbid obesity. Skin: normal turgor, no rashes, warm and dry HEENT: Normocephalic, atraumatic. Pupils equal round and reactive to light; sclera anicteric; extraocular muscles intact;  Nose without nasal septal hypertrophy Mouth/Parynx benign; Mallinpatti scale 3/4 Neck: No JVD, no carotid bruits; normal carotid upstroke Lungs: clear to ausculatation and percussion; no wheezing or rales Chest wall: without tenderness to palpitation Heart: PMI not displaced, RRR, s1 s2 normal, 1/6 systolic murmur, no diastolic murmur, no rubs, gallops, thrills, or heaves Abdomen: Central adiposity; soft, nontender; no  hepatosplenomehaly, BS+; abdominal aorta nontender and not dilated by palpation. Back: no CVA tenderness Pulses 2+ Musculoskeletal: full range of motion, normal strength, no joint deformities Extremities: no clubbing cyanosis or edema, Homan's sign negative  Neurologic: grossly nonfocal; Cranial nerves grossly wnl Psychologic: Normal mood and affect   Studies/Labs Reviewed:   EKG:  EKG is ordered today.  ECG (independently read by me):  NSR at 69, PAC  Recent Labs: BMP Latest Ref Rng & Units 02/20/2020 11/02/2017 04/10/2017  Glucose 65 - 99 mg/dL 100(H) 107(H) 129(H)  BUN 8 - 27 mg/dL _1 Creatinine 0.57 - 1.00 mg/dL 0.95 0.94 1.04(H)  BUN/Creat Ratio 12 - 28 20 - 20  Sodium 134 - 144 mmol/L 140 137 139  Potassium 3.5 - 5.2 mmol/L 4.4 3.8 4.4  Chloride 96 - 106 mmol/L 99 98 98  CO2 20 - 29 mmol/L _2 Calcium 8.7 - 10.3 mg/dL 9.6 9.7 9.5     Hepatic Function Latest Ref Rng & Units 02/20/2020 03/13/2017 02/11/2007  Total Protein 6.0 - 8.5 g/dL 7.4 6.5 7.1  Albumin 3.8 - 4.8 g/dL 4.6 3.3(L) 3.6  AST 0 - 40 IU/L 16 32 17  ALT 0 - 32 IU/L 7 19 <8  Alk Phosphatase 48 - 121 IU/L 46(L) 39 37(L)  Total Bilirubin 0.0 - 1.2 mg/dL 0.3 0.9 0.5  Bilirubin, Direct - - - <0.1    CBC Latest Ref Rng & Units 04/10/2017 03/16/2017 03/14/2017  WBC 3.4 - 10.8 x10E3/uL 5.4 5.4 6.7  Hemoglobin 11.1 - 15.9 g/dL 14.3 14.3 13.9  Hematocrit 34.0 - 46.6 % 43.8 50.0(H) 47.8(H)  Platelets 150 - 379 x10E3/uL 182 200 219   Lab Results  Component Value Date   MCV 81 04/10/2017   MCV 89.4 03/16/2017   MCV 87.2 03/14/2017   Lab Results  Component Value Date   TSH 5.971 (H) 03/13/2017   Lab Results  Component Value Date   HGBA1C 6.9 (H) 03/13/2017  BNP    Component Value Date/Time   BNP 210.2 (H) 03/13/2017 1618    ProBNP    Component Value Date/Time   PROBNP 49 04/10/2017 1045   PROBNP 122.8 12/13/2011 1256     Lipid Panel     Component Value Date/Time   CHOL 151  03/14/2017 0427   TRIG 107 03/14/2017 0427   HDL 40 (L) 03/14/2017 0427   CHOLHDL 3.8 03/14/2017 0427   VLDL 21 03/14/2017 0427   LDLCALC 90 03/14/2017 0427     RADIOLOGY: No results found.   Additional studies/ records that were reviewed today include:  I was able to obtain the patient's formal sleep report from [facility] from procedure date May 29, 2013.  Records of [facility], [place] were reviewed.  Download was obtained in the office today  ASSESSMENT:    1. OSA (obstructive sleep apnea)   2. Chronic diastolic CHF (congestive heart failure) ([place])   3. Morbid obesity ([place])   4. Hyperlipidemia, unspecified hyperlipidemia type   5. Type II diabetes mellitus with complication [facility])     PLAN:  [name] is a very pleasant 65 year old female who has a history of morbid obesity with current BMI at 56.88.  She apparently was diagnosed with at least moderate overall sleep apnea with an AHI of 18.2 but during that evaluation she had severe oxygen desaturation to a minimum of 51%.  She apparently initially received a CPAP machine at that time and apparently received a new ResMed air sense 10 AutoSet CPAP machine on May 12, 2017.  She is in need now to establish follow-up care for sleep medicine.  She typically goes to bed between 1 and 2 AM and has been waking up at 8 AM.  Her most recent download shows excellent compliance however she is only using CPAP on average for 5 hours and 30 minutes.  Her Epworth Sleepiness Scale score endorsed at 13 and is consistent with residual daytime sleepiness.  Her AHI is excellent at 0.3.  Her excessive daytime sleepiness is undoubtedly contributed by her inadequate sleep duration at only 5 hours and 30 minutes.  I discussed with her current recommendations for optimal sleep duration at 7 and 9 hours.  We also discussed data regarding increased mortality with sleep duration consistently less than 6 hours per night.  Reviewed  with her the benefits of continued therapy with reference to cardiovascular risk.  Presently she is maintaining sinus rhythm.  Her blood pressure is well controlled and she has been taking furosemide 40 mg daily.  She is on levothyroxine 50 mcg for hypothyroidism.  She is diabetic on Victoza.  She is on atorvastatin 20 mg for hyperlipidemia with target LDL less than 70.  Discussed the importance of increased sleep duration.  I also discussed the importance of weight loss and increased exercise.  She is in need for new mask and I have recommended she try the ResMed AirFit F30i mask with the tubing will come from the top of her head and the fullface mask will have the nose component being under the nose rather than on the bridge of the nose with full face support.  I am recommending we change her CPAP settings from a minimum pressure of 4 and I will change her pressure settings to a range of 8 to 18 cm of water.  I will see her back in 1 year for reevaluation or sooner as needed.   Medication Adjustments/Labs and Tests Ordered: Current medicines  are reviewed at length with the patient today.  Concerns regarding medicines are outlined above.  Medication changes, Labs and Tests ordered today are listed in the Patient Instructions below. Patient Instructions  Medication Instructions:  Your physician recommends that you continue on your current medications as directed. Please refer to the Current Medication list given to you today.  *If you need a refill on your cardiac medications before your next appointment, please call your pharmacy*  Follow-Up: At [facility], you and your health needs are our priority.  As part of our continuing mission to provide you with exceptional heart care, we have created designated Provider Care Teams.  These Care Teams include your primary Cardiologist (physician) and Advanced Practice Providers (APPs -  Physician Assistants and Nurse Practitioners) who all work together to  provide you with the care you need, when you need it.  We recommend signing up for the patient portal called "MyChart".  Sign up information is provided on this After Visit Summary.  MyChart is used to connect with patients for Virtual Visits (Telemedicine).  Patients are able to view lab/test results, encounter notes, upcoming appointments, etc.  Non-urgent messages can be sent to your provider as well.   To learn more about what you can do with MyChart, go to [email or web address].    Your next appointment:   12 month(s)  The format for your next appointment:   In Person  Provider:   Shelva Majestic, MD    Signed, Shelva Majestic, MD  05/05/2021 7:02 PM    [facility] [address], [place], [place], [place]  [address] Phone: [phone]

## 2021-05-05 ENCOUNTER — Encounter: Payer: Self-pay | Admitting: Cardiovascular Disease

## 2021-05-11 NOTE — Addendum Note (Signed)
Addended by: Hinton Dyer on: 05/11/2021 11:58 AM   Modules accepted: Orders

## 2021-05-12 ENCOUNTER — Telehealth: Payer: Self-pay | Admitting: *Deleted

## 2021-05-12 NOTE — Telephone Encounter (Signed)
Order for F-30i mask faxed to [facility] per Dr Evette Georges request.

## 2021-07-08 ENCOUNTER — Ambulatory Visit: Payer: Medicare Other | Admitting: Cardiovascular Disease

## 2021-07-22 ENCOUNTER — Other Ambulatory Visit: Payer: Self-pay | Admitting: Cardiology

## 2021-08-09 ENCOUNTER — Other Ambulatory Visit: Payer: Self-pay

## 2021-08-09 MED ORDER — POTASSIUM CHLORIDE CRYS ER 20 MEQ PO TBCR: 20.0000 meq | EXTENDED_RELEASE_TABLET | Freq: Two times a day (BID) | ORAL | 2 refills | Status: DC

## 2021-08-19 ENCOUNTER — Other Ambulatory Visit: Payer: Self-pay | Admitting: Gastroenterology

## 2021-10-01 ENCOUNTER — Ambulatory Visit (HOSPITAL_COMMUNITY): Admit: 2021-10-01 | Payer: Medicare Other | Admitting: Gastroenterology

## 2021-10-01 ENCOUNTER — Encounter (HOSPITAL_COMMUNITY): Payer: Self-pay

## 2021-10-01 SURGERY — COLONOSCOPY WITH PROPOFOL
Anesthesia: Monitor Anesthesia Care

## 2021-12-15 ENCOUNTER — Other Ambulatory Visit: Payer: Self-pay | Admitting: Family Medicine

## 2021-12-15 DIAGNOSIS — Z1231 Encounter for screening mammogram for malignant neoplasm of breast: Secondary | ICD-10-CM

## 2021-12-31 ENCOUNTER — Ambulatory Visit
Admission: RE | Admit: 2021-12-31 | Discharge: 2021-12-31 | Disposition: A | Discharge status: Home / Self Care | Payer: 59 | Source: Ambulatory Visit | Attending: Family Medicine | Admitting: Family Medicine

## 2021-12-31 DIAGNOSIS — Z1231 Encounter for screening mammogram for malignant neoplasm of breast: Secondary | ICD-10-CM

## 2022-01-04 ENCOUNTER — Other Ambulatory Visit: Payer: Self-pay | Admitting: Family Medicine

## 2022-01-04 DIAGNOSIS — R928 Other abnormal and inconclusive findings on diagnostic imaging of breast: Secondary | ICD-10-CM

## 2022-01-06 ENCOUNTER — Ambulatory Visit
Admission: RE | Admit: 2022-01-06 | Discharge: 2022-01-06 | Disposition: A | Discharge status: Home / Self Care | Payer: 59 | Source: Ambulatory Visit | Attending: Family Medicine | Admitting: Family Medicine

## 2022-01-06 ENCOUNTER — Other Ambulatory Visit: Payer: Self-pay | Admitting: Radiology

## 2022-01-06 ENCOUNTER — Other Ambulatory Visit: Payer: Self-pay | Admitting: Family Medicine

## 2022-01-06 DIAGNOSIS — R921 Mammographic calcification found on diagnostic imaging of breast: Secondary | ICD-10-CM

## 2022-01-06 DIAGNOSIS — R928 Other abnormal and inconclusive findings on diagnostic imaging of breast: Secondary | ICD-10-CM

## 2022-05-01 ENCOUNTER — Other Ambulatory Visit: Payer: Self-pay | Admitting: Cardiology

## 2022-05-24 ENCOUNTER — Other Ambulatory Visit: Payer: Self-pay | Admitting: Cardiology

## 2022-06-01 ENCOUNTER — Ambulatory Visit: Payer: 59 | Attending: Physician Assistant | Admitting: Physician Assistant

## 2022-06-01 ENCOUNTER — Encounter: Payer: Self-pay | Admitting: Physician Assistant

## 2022-06-01 VITALS — BP 112/72 | HR 70 | Ht 64.0 in | Wt 318.4 lb

## 2022-06-01 DIAGNOSIS — E118 Type 2 diabetes mellitus with unspecified complications: Secondary | ICD-10-CM | POA: Diagnosis not present

## 2022-06-01 DIAGNOSIS — E785 Hyperlipidemia, unspecified: Secondary | ICD-10-CM

## 2022-06-01 DIAGNOSIS — I5032 Chronic diastolic (congestive) heart failure: Secondary | ICD-10-CM

## 2022-06-01 DIAGNOSIS — G4733 Obstructive sleep apnea (adult) (pediatric): Secondary | ICD-10-CM

## 2022-06-01 NOTE — Patient Instructions (Signed)
Medication Instructions:   Your physician recommends that you continue on your current medications as directed. Please refer to the Current Medication list given to you today.   *If you need a refill on your cardiac medications before your next appointment, please call your pharmacy*   Lab Work:  None ordered.  If you have labs (blood work) drawn today and your tests are completely normal, you will receive your results only by: [place] (if you have MyChart) OR A paper copy in the mail If you have any lab test that is abnormal or we need to change your treatment, we will call you to review the results.   Testing/Procedures:  None ordered.   Follow-Up: At [facility], you and your health needs are our priority.  As part of our continuing mission to provide you with exceptional heart care, we have created designated Provider Care Teams.  These Care Teams include your primary Cardiologist (physician) and Advanced Practice Providers (APPs -  Physician Assistants and Nurse Practitioners) who all work together to provide you with the care you need, when you need it.  We recommend signing up for the patient portal called "MyChart".  Sign up information is provided on this After Visit Summary.  MyChart is used to connect with patients for Virtual Visits (Telemedicine).  Patients are able to view lab/test results, encounter notes, upcoming appointments, etc.  Non-urgent messages can be sent to your provider as well.   To learn more about what you can do with MyChart, go to [email or web address].    Your next appointment:   1 year(s)  The format for your next appointment:   In Person  Provider:   Candee Furbish, MD     Other Instructions  Your physician wants you to follow-up in: 1 year with Dr. Marlou Porch. You will receive a reminder letter in the mail two months in advance. If you don't receive a letter, please call our office to schedule the follow-up  appointment.   Important Information About Sugar

## 2022-06-01 NOTE — Progress Notes (Signed)
Cardiology Office Note:    Date:  06/01/2022   ID:  Theresa Kirby, DOB [birth year], MRN [id number]  PCP:  Vassie Moment, MD (Inactive)  [facility] Cardiologist:  Candee Furbish, MD  [facility] Electrophysiologist:  None  Sleep: Dr. Claiborne Billings   Chief Complaint: Yearly follow up   History of Present Illness:    Theresa Kirby is a 66 y.o. female with a hx of chronic diastolic CHF, morbid obesity, OSA on CPAP, HLD  and IDA seen for follow up.    Here today for follow-up.  Reports chronic lower extremity edema.  Compliant with low-sodium diet.  She takes Lasix.  She has follow-up with PCP next week for yearly checkup.  We will do blood work at that time.  She denies chest pain, shortness of breath, orthopnea, PND, syncope or melena.  Compliant with CPAP.  Past Medical History:  Diagnosis Date   Arthritis    knees   Asthma    Iron deficiency anemia    Morbid obesity ([place])    Rhabdomyolysis 04/2016   normal compartment pressures   Sleep apnea    uses cpap, setting is unknown, does not wear it consistently   Thyroid disease     Past Surgical History:  Procedure Laterality Date   CESAREAN SECTION     COLONOSCOPY WITH PROPOFOL N/A 07/21/2015   Procedure: COLONOSCOPY WITH PROPOFOL;  Surgeon: Juanita Craver, MD;  Location: [facility];  Service: Endoscopy;  Laterality: N/A;   DILATION AND CURETTAGE OF UTERUS     ENDOMETRIAL ABLATION     GANGLION CYST EXCISION     rt arm   SHOULDER SURGERY Left    Rotator cuff repair    Current Medications: Current Meds  Medication Sig   acetaminophen (TYLENOL) 325 MG tablet Take 650 mg by mouth every 6 (six) hours as needed (pain).    albuterol (VENTOLIN HFA) 108 (90 Base) MCG/ACT inhaler Inhale 2 puffs into the lungs every 6 (six) hours as needed for wheezing or shortness of breath (rescue).    allopurinol (ZYLOPRIM) 100 MG tablet Take 100 mg by mouth as needed (for gout and kidney stones).   aspirin EC 81 MG EC tablet Take 1 tablet (81  mg total) by mouth daily.   atorvastatin (LIPITOR) 20 MG tablet Take 20 mg by mouth at bedtime.   colchicine 0.6 MG tablet Take 0.6 mg by mouth as needed (for gout attack).   diclofenac sodium (VOLTAREN) 1 % GEL Apply 2 g topically 2 (two) times daily as needed (pain). Applies to knees.   ferrous sulfate 325 (65 FE) MG tablet Take 325 mg by mouth 3 (three) times daily with meals.   fluticasone-salmeterol (ADVAIR) 500-50 MCG/ACT AEPB Inhale 1 puff into the lungs 2 (two) times daily.   furosemide (LASIX) 40 MG tablet Take 1 tablet (40 mg total) by mouth daily. Please contact our office to schedule an overdue office visit for any future refills, thank you. [phone]. 2nd attempt   levothyroxine (SYNTHROID) 50 MCG tablet Take 50 mcg by mouth daily.   montelukast (SINGULAIR) 10 MG tablet Take 10 mg by mouth at bedtime as needed (allergies.).    Multiple Vitamin (MULTIVITAMIN) capsule Take 1 capsule by mouth daily.   Omega-3 Fatty Acids (FISH OIL PO) Take 1 capsule by mouth daily.   OZEMPIC, 0.25 OR 0.5 MG/DOSE, 2 MG/3ML SOPN Inject 0.5 mg into the skin once a week.   potassium chloride SA (KLOR-CON M) 20 MEQ tablet Take 1 tablet (  20 mEq total) by mouth 2 (two) times daily.     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  Tobacco Use   Smoking status: Never   Smokeless tobacco: Never  Vaping Use   Vaping Use: Never used  Substance and Sexual Activity   Alcohol use: No   Drug use: No   Sexual activity: Not Currently    Birth control/protection: None  Other Topics Concern   Not on file  Social History  Narrative   Not on file   Social Determinants of Health   Financial Resource Strain: Not on file  Food Insecurity: Not on file  Transportation Needs: Not on file  Physical Activity: Not on file  Stress: Not on file  Social Connections: Not on file     Family History: The patient's family history includes Breast cancer in her maternal aunt; Heart disease in her maternal grandmother and mother; Heart failure in her mother; Hypertension in her brother and maternal grandmother.    ROS:   Please see the history of present illness.    All other systems reviewed and are negative.   EKGs/Labs/Other Studies Reviewed:    The following studies were reviewed today:    - Left ventricle: The cavity size was normal. Wall thickness was    normal. Systolic function was normal. The estimated ejection    fraction was in the range of 60% to 65%. Although no diagnostic    regional wall motion abnormality was identified, this possibility    cannot be completely excluded on the basis of this study.    Features are consistent with a pseudonormal left ventricular    filling pattern, with concomitant abnormal relaxation and    increased filling pressure (grade 2 diastolic dysfunction).  - Ventricular septum: Mildly D-shaped interventricular septum    suggestive of RV pressure/volume overload.  - Aortic valve: There was no stenosis.  - Mitral valve: There was no significant regurgitation.  - Right ventricle: The cavity size was mildly dilated. Systolic    function was normal.  - Right atrium: The atrium was mildly dilated.  - Pulmonary arteries: No complete TR doppler jet so unable to    estimate PA systolic pressure.  - Systemic veins: IVC measured 2.0 cm with < 50% respirophasic    variation, suggesting RA pressure 8 mmHg.   Impressions:   - Normal LV size with EF 60-65%. Moderate diastolic dysfunction.    Mildly dilated RV with normal systolic function. Mildly D-shaped    interventricular  septum suggesting RV pressure/volume overload.   EKG:  EKG is  ordered today.  The ekg ordered today demonstrates normal sinus rhythm  Recent Labs: No results found for requested labs within last 365 days.  Recent Lipid Panel    Component Value Date/Time   CHOL 151 03/14/2017 0427   TRIG 107 03/14/2017 0427   HDL 40 (L) 03/14/2017 0427   CHOLHDL 3.8 03/14/2017 0427   VLDL 21 03/14/2017 0427   LDLCALC 90 03/14/2017 0427     Physical Exam:    VS:  BP 112/72 (BP Location: Left Arm, Patient Position: Sitting,  Cuff Size: Normal)   Pulse 70   Ht '5\' 4"'$  (1.626 m)   Wt (!) 318 lb 6.4 oz (144.4 kg)   LMP 05/12/2012   SpO2 94%   BMI 54.65 kg/m     Wt Readings from Last 3 Encounters:  06/01/22 (!) 318 lb 6.4 oz (144.4 kg)  04/28/21 (!) 331 lb 6.4 oz (150.3 kg)  03/16/21 (!) 329 lb 12.8 oz (149.6 kg)     GEN:  Well nourished, well developed in no acute distress HEENT: Normal NECK: No JVD; No carotid bruits LYMPHATICS: No lymphadenopathy CARDIAC: RRR, no murmurs, rubs, gallops RESPIRATORY:  Clear to auscultation without rales, wheezing or rhonchi  ABDOMEN: Soft, non-tender, non-distended MUSCULOSKELETAL:  No edema; No deformity  SKIN: Warm and dry NEUROLOGIC:  Alert and oriented x 3 PSYCHIATRIC:  Normal affect   ASSESSMENT AND PLAN:    Chronic diastolic heart failure Has chronic lower extremity edema.  Continue Lasix at current dose.  Advised to take extra 1 tablet if needed.  Recommended leg elevation and compression stocking.  Advised further cut back on salt.  She is due for lab which which will be done at PCP office next week.  2.  Obstructive sleep apnea -Compliant with CPAP  3.  Mixed lipidemia -Patient reports muscle ache on Lipitor and not taking for long time. -She will have blood work with PCP next week and further discussion.  Medication Adjustments/Labs and Tests Ordered: Current medicines are reviewed at length with the patient today.  Concerns regarding  medicines are outlined above.  Orders Placed This Encounter  Procedures   EKG 12-Lead   No orders of the defined types were placed in this encounter.   Patient Instructions  Medication Instructions:   Your physician recommends that you continue on your current medications as directed. Please refer to the Current Medication list given to you today.   *If you need a refill on your cardiac medications before your next appointment, please call your pharmacy*   Lab Work:  None ordered.  If you have labs (blood work) drawn today and your tests are completely normal, you will receive your results only by: [place] (if you have MyChart) OR A paper copy in the mail If you have any lab test that is abnormal or we need to change your treatment, we will call you to review the results.   Testing/Procedures:  None ordered.   Follow-Up: At [facility], you and your health needs are our priority.  As part of our continuing mission to provide you with exceptional heart care, we have created designated Provider Care Teams.  These Care Teams include your primary Cardiologist (physician) and Advanced Practice Providers (APPs -  Physician Assistants and Nurse Practitioners) who all work together to provide you with the care you need, when you need it.  We recommend signing up for the patient portal called "MyChart".  Sign up information is provided on this After Visit Summary.  MyChart is used to connect with patients for Virtual Visits (Telemedicine).  Patients are able to view lab/test results, encounter notes, upcoming appointments, etc.  Non-urgent messages can be sent to your provider as well.   To learn more about what you can do with MyChart, go to [email or web address].    Your next appointment:   1 year(s)  The format for your next appointment:   In Person  Provider:   Candee Furbish, MD     Other Instructions  Your physician wants you to follow-up  in: 1 year with  Dr. Marlou Porch. You will receive a reminder letter in the mail two months in advance. If you don't receive a letter, please call our office to schedule the follow-up appointment.   Important Information About Sugar         Jarrett Soho, PA  06/01/2022 10:31 AM    [facility]

## 2022-06-08 ENCOUNTER — Other Ambulatory Visit: Payer: Self-pay | Admitting: Physician Assistant

## 2022-06-08 DIAGNOSIS — Z13820 Encounter for screening for osteoporosis: Secondary | ICD-10-CM

## 2022-06-15 ENCOUNTER — Other Ambulatory Visit: Payer: Self-pay | Admitting: Cardiology

## 2022-06-21 ENCOUNTER — Other Ambulatory Visit: Payer: Self-pay

## 2022-06-21 MED ORDER — FUROSEMIDE 40 MG PO TABS: 40.0000 mg | ORAL_TABLET | Freq: Every day | ORAL | 3 refills | Status: DC

## 2022-06-21 NOTE — Telephone Encounter (Signed)
Pt's medication was sent to pt's pharmacy as requested. Confirmation received.  °

## 2022-06-23 ENCOUNTER — Ambulatory Visit
Admission: RE | Admit: 2022-06-23 | Discharge: 2022-06-23 | Disposition: A | Discharge status: Home / Self Care | Payer: 59 | Source: Ambulatory Visit | Attending: Physician Assistant | Admitting: Physician Assistant

## 2022-06-23 DIAGNOSIS — Z13820 Encounter for screening for osteoporosis: Secondary | ICD-10-CM

## 2022-08-24 NOTE — Progress Notes (Unsigned)
Office Visit    Patient Name: Theresa Kirby Date of Encounter: 08/25/2022  PCP:  Vassie Moment, MD (Inactive)   [place]  Cardiologist:  Candee Furbish, MD  Advanced Practice Provider:  No care team member to display Electrophysiologist:  None   HPI    Theresa Kirby is a 67 y.o. female with a past medical history of chronic diastolic CHF, morbid obesity, OSA on CPAP, HLD and IDA presents today for follow-up appointment.   She was last seen 06/01/2022 and at that time had reported chronic lower extremity edema.  She been compliant with a low-sodium diet.  She did take Lasix.  She did follow-up regularly with her PCP.  Plan for blood work at that time.  She was compliant with her CPAP.  She was prescribed an extra tablet of Lasix as needed for lower extremity edema and asked to elevate her legs.  Compression stockings were advised.  Continuing of low-sodium diet.  Today, she tells me that she is having more shortness of breath.  She also is having some swelling in her legs which occur more commonly at the end of the day.  Usually the swelling resolves overnight when her feet are elevated in the bed.  We discussed options for medical therapy for diastolic dysfunction that was noted on her echocardiogram back in 2018.  Unfortunately, she was started on Farxiga back in 2019 by her endocrinologist and experienced blisters all over her chest so this is not a medication option for her.  Her blood pressure is not high enough to start Entresto today.  I think it would be reasonable to update an echocardiogram today since it has been a few years.  We also discussed her most recent lipid panel.  She has not been taking Lipitor due to muscle aches.  We discussed trying Crestor and getting a repeat lipid panel in 2 months.   Reports no chest pain, pressure, or tightness. Reports no palpitations.    Past Medical History    Past Medical History:  Diagnosis Date   Arthritis     knees   Asthma    Iron deficiency anemia    Morbid obesity ([place])    Rhabdomyolysis 04/2016   normal compartment pressures   Sleep apnea    uses cpap, setting is unknown, does not wear it consistently   Thyroid disease    Past Surgical History:  Procedure Laterality Date   CESAREAN SECTION     COLONOSCOPY WITH PROPOFOL N/A 07/21/2015   Procedure: COLONOSCOPY WITH PROPOFOL;  Surgeon: Juanita Craver, MD;  Location: [facility];  Service: Endoscopy;  Laterality: N/A;   DILATION AND CURETTAGE OF UTERUS     ENDOMETRIAL ABLATION     GANGLION CYST EXCISION     rt arm   SHOULDER SURGERY Left    Rotator cuff repair    Allergies  Allergies  Allergen Reactions   Sulfa Antibiotics Hives and Shortness Of Breath   Farxiga [Dapagliflozin]     Blisters   Levothyroxine Sodium     Other reaction(s): Abd pain   Lipitor [Atorvastatin]     Muscle ache   Metformin Hcl     Other reaction(s): Abd discomfort   Sulfamethoxazole-Trimethoprim     Other reaction(s): hives    EKGs/Labs/Other Studies Reviewed:   The following studies were reviewed today: No recent studies  EKG:  EKG is not ordered today.  Recent Labs: No results found for requested labs within last 365 days.  Recent Lipid Panel    Component Value Date/Time   CHOL 151 03/14/2017 0427   TRIG 107 03/14/2017 0427   HDL 40 (L) 03/14/2017 0427   CHOLHDL 3.8 03/14/2017 0427   VLDL 21 03/14/2017 0427   LDLCALC 90 03/14/2017 0427     Home Medications   Current Meds  Medication Sig   acetaminophen (TYLENOL) 325 MG tablet Take 650 mg by mouth every 6 (six) hours as needed (pain).    albuterol (VENTOLIN HFA) 108 (90 Base) MCG/ACT inhaler Inhale 2 puffs into the lungs every 6 (six) hours as needed for wheezing or shortness of breath (rescue).    allopurinol (ZYLOPRIM) 100 MG tablet Take 100 mg by mouth as needed (for gout and kidney stones).   aspirin EC 81 MG EC tablet Take 1 tablet (81 mg total) by mouth daily.    colchicine 0.6 MG tablet Take 0.6 mg by mouth as needed (for gout attack).   diclofenac sodium (VOLTAREN) 1 % GEL Apply 2 g topically 2 (two) times daily as needed (pain). Applies to knees.   ferrous sulfate 325 (65 FE) MG tablet Take 325 mg by mouth 3 (three) times daily with meals.   fluticasone (FLONASE) 50 MCG/ACT nasal spray Place 1 spray into both nostrils as needed for allergies.   fluticasone-salmeterol (ADVAIR) 500-50 MCG/ACT AEPB Inhale 1 puff into the lungs 2 (two) times daily.   furosemide (LASIX) 40 MG tablet Take 1 tablet (40 mg total) by mouth daily. May take one additional tablet as needed for shortness of breath or lower extremity edema.   levothyroxine (SYNTHROID) 50 MCG tablet Take 50 mcg by mouth daily.   montelukast (SINGULAIR) 10 MG tablet Take 10 mg by mouth at bedtime as needed (allergies.).    Multiple Vitamin (MULTIVITAMIN) capsule Take 1 capsule by mouth daily.   Omega-3 Fatty Acids (FISH OIL PO) Take 1 capsule by mouth daily.   OZEMPIC, 0.25 OR 0.5 MG/DOSE, 2 MG/3ML SOPN Inject 0.5 mg into the skin once a week.   potassium chloride SA (KLOR-CON M) 20 MEQ tablet Take 1 tablet (20 mEq total) by mouth 2 (two) times daily.   rosuvastatin (CRESTOR) 5 MG tablet Take 1 tablet (5 mg total) by mouth daily.   VICTOZA 18 MG/3ML SOPN Inject 1.8 mg into the skin daily.   [DISCONTINUED] furosemide (LASIX) 40 MG tablet Take 1 tablet (40 mg total) by mouth daily.     Review of Systems      All other systems reviewed and are otherwise negative except as noted above.  Physical Exam    VS:  BP 118/72   Pulse 69   Ht '5\' 4"'$  (1.626 m)   Wt (!) 321 lb 12.8 oz (146 kg)   LMP 05/12/2012   SpO2 99%   BMI 55.24 kg/m  , BMI Body mass index is 55.24 kg/m.  Wt Readings from Last 3 Encounters:  08/25/22 (!) 321 lb 12.8 oz (146 kg)  06/01/22 (!) 318 lb 6.4 oz (144.4 kg)  04/28/21 (!) 331 lb 6.4 oz (150.3 kg)     GEN: Well nourished, well developed, in no acute distress. HEENT:  normal. Neck: Supple, no JVD, carotid bruits Cardiac: RRR, no murmurs, rubs, or gallops. No clubbing, cyanosis, 2 + bilateral pitting edema.  Radials/PT 2+ and equal bilaterally.  Respiratory:  Respirations regular and unlabored, clear to auscultation bilaterally. GI: Soft, nontender, nondistended. MS: No deformity or atrophy. Skin: Warm and dry, no rash. Neuro:  Strength and sensation are  intact. Psych: Normal affect.  Assessment & Plan    Chronic diastolic heart failure -she is having SOB and some edema especially at the end of the day -She has a history of allergic reaction to [place] -She did not know that she could take an extra Lasix 40 mg for swelling so encouraged her to do this today -Entresto may be an option for her down the road but right now her blood pressure is low normal -I have encouraged her to wear compression hose or socks and keep feet elevated when possible -We will plan to update an echocardiogram since it has been since 2018  Obstructive sleep apnea with CPAP -compliant with CPAP  Mixed hyperlipidemia -Lipid panel back in November showed LDL 157 and triglycerides 192 -I explained that goal would be triglycerides less than 150 and LDL less than 100 (no CAD) -She has been off her Lipitor due to muscle aches and did not start on any medication -We will try her on Crestor and get a repeat lipid panel in 8 weeks -low salt diet  Lower ext edema -This seems to be a chronic issue -She is encouraged to take an extra 40 mg of Lasix as needed for lower extremity swelling -We will update an echocardiogram  5. Swollen lymph nodes -would refer to PCP for further workup        Disposition: Follow up 3 months with Candee Furbish, MD or APP.  Signed, Elgie Collard, PA-C 08/25/2022, 12:21 PM [facility]

## 2022-08-25 ENCOUNTER — Encounter: Payer: Self-pay | Admitting: Physician Assistant

## 2022-08-25 ENCOUNTER — Encounter (HOSPITAL_COMMUNITY): Payer: Self-pay | Admitting: Emergency Medicine

## 2022-08-25 ENCOUNTER — Other Ambulatory Visit: Payer: Self-pay

## 2022-08-25 ENCOUNTER — Ambulatory Visit (HOSPITAL_COMMUNITY)
Admission: EM | Admit: 2022-08-25 | Discharge: 2022-08-25 | Disposition: A | Discharge status: Home / Self Care | Payer: 59 | Attending: Physician Assistant | Admitting: Physician Assistant

## 2022-08-25 ENCOUNTER — Ambulatory Visit: Payer: 59 | Attending: Physician Assistant | Admitting: Physician Assistant

## 2022-08-25 VITALS — BP 118/72 | HR 69 | Ht 64.0 in | Wt 321.8 lb

## 2022-08-25 DIAGNOSIS — R590 Localized enlarged lymph nodes: Secondary | ICD-10-CM | POA: Insufficient documentation

## 2022-08-25 DIAGNOSIS — E785 Hyperlipidemia, unspecified: Secondary | ICD-10-CM

## 2022-08-25 DIAGNOSIS — I5032 Chronic diastolic (congestive) heart failure: Secondary | ICD-10-CM | POA: Diagnosis not present

## 2022-08-25 DIAGNOSIS — R600 Localized edema: Secondary | ICD-10-CM | POA: Diagnosis not present

## 2022-08-25 DIAGNOSIS — M546 Pain in thoracic spine: Secondary | ICD-10-CM

## 2022-08-25 DIAGNOSIS — G4733 Obstructive sleep apnea (adult) (pediatric): Secondary | ICD-10-CM | POA: Diagnosis not present

## 2022-08-25 LAB — POCT URINALYSIS DIPSTICK, ED / UC
Bilirubin Urine: NEGATIVE
Glucose, UA: NEGATIVE mg/dL
Hgb urine dipstick: NEGATIVE
Ketones, ur: NEGATIVE mg/dL
Leukocytes,Ua: NEGATIVE
Nitrite: NEGATIVE
Protein, ur: NEGATIVE mg/dL
Specific Gravity, Urine: 1.025 (ref 1.005–1.030)
Urobilinogen, UA: 0.2 mg/dL (ref 0.0–1.0)
pH: 5 (ref 5.0–8.0)

## 2022-08-25 LAB — COMPREHENSIVE METABOLIC PANEL
ALT: 10 U/L (ref 0–44)
AST: 17 U/L (ref 15–41)
Albumin: 3.8 g/dL (ref 3.5–5.0)
Alkaline Phosphatase: 36 U/L — ABNORMAL LOW (ref 38–126)
Anion gap: 11 (ref 5–15)
BUN: 17 mg/dL (ref 8–23)
CO2: 29 mmol/L (ref 22–32)
Calcium: 9.5 mg/dL (ref 8.9–10.3)
Chloride: 99 mmol/L (ref 98–111)
Creatinine, Ser: 0.88 mg/dL (ref 0.44–1.00)
GFR, Estimated: >60 mL/min (ref >60)
Glucose, Bld: 101 mg/dL — ABNORMAL HIGH (ref 70–99)
Potassium: 3.9 mmol/L (ref 3.5–5.1)
Sodium: 139 mmol/L (ref 135–145)
Total Bilirubin: 0.4 mg/dL (ref 0.3–1.2)
Total Protein: 7.6 g/dL (ref 6.5–8.1)

## 2022-08-25 LAB — CBC WITH DIFFERENTIAL/PLATELET
Abs Immature Granulocytes: 0.01 10*3/uL (ref 0.00–0.07)
Basophils Absolute: 0.1 10*3/uL (ref 0.0–0.1)
Basophils Relative: 1 %
Eosinophils Absolute: 0.1 10*3/uL (ref 0.0–0.5)
Eosinophils Relative: 2 %
HCT: 44.6 % (ref 36.0–46.0)
Hemoglobin: 13.9 g/dL (ref 12.0–15.0)
Immature Granulocytes: 0 %
Lymphocytes Relative: 30 %
Lymphs Abs: 2.1 10*3/uL (ref 0.7–4.0)
MCH: 26.4 pg (ref 26.0–34.0)
MCHC: 31.2 g/dL (ref 30.0–36.0)
MCV: 84.6 fL (ref 80.0–100.0)
Monocytes Absolute: 0.6 10*3/uL (ref 0.1–1.0)
Monocytes Relative: 8 %
Neutro Abs: 4.1 10*3/uL (ref 1.7–7.7)
Neutrophils Relative %: 59 %
Platelets: 229 10*3/uL (ref 150–400)
RBC: 5.27 MIL/uL — ABNORMAL HIGH (ref 3.87–5.11)
RDW: 15.2 % (ref 11.5–15.5)
WBC: 6.9 10*3/uL (ref 4.0–10.5)
nRBC: 0 % (ref 0.0–0.2)

## 2022-08-25 LAB — HIV ANTIBODY (ROUTINE TESTING W REFLEX): HIV Screen 4th Generation wRfx: NONREACTIVE

## 2022-08-25 MED ORDER — ROSUVASTATIN CALCIUM 5 MG PO TABS: 5.0000 mg | ORAL_TABLET | Freq: Every day | ORAL | 3 refills | Status: DC

## 2022-08-25 MED ORDER — FUROSEMIDE 40 MG PO TABS: 40.0000 mg | ORAL_TABLET | Freq: Every day | ORAL | 3 refills | Status: DC

## 2022-08-25 MED ORDER — BACLOFEN 5 MG PO TABS: 5.0000 mg | ORAL_TABLET | Freq: Every evening | ORAL | 0 refills | Status: DC | PRN

## 2022-08-25 NOTE — ED Triage Notes (Signed)
Fatigue, lower back pain, pain radiating around to the front of stomach.  1/23 had a dentist appt, and a standard cleaning.  Reports dentist commented on lymph nodes being swollen.  At that time was feeling tired.  Symptoms worsened.  Patient reports sob.    Saw cardiologist today and some medicines changed , but no particular concerns.  And encouraged patient to see pcp about back pain.

## 2022-08-25 NOTE — ED Provider Notes (Signed)
[facility]    CSN: [id number] Arrival date & time: 08/25/22  1726      History   Chief Complaint Chief Complaint  Patient presents with   Fatigue    HPI Theresa Kirby is a 67 y.o. female.   Patient presents today for evaluation of several concerns.  Her primary concern today is a week plus long history of thoracic/lumbar back.  She denies any known injury or increase in activity prior to symptom onset.  Reports pain is bilateral, rated 5 on a 0-10 pain scale, described as aching, worse with changing positions, no alleviating factors identified.  She does report some abdominal discomfort but denies any nausea, vomiting, diarrhea.  She denies any urinary symptoms outside of urinary frequency which is chronic due to ongoing diuretic use.  She denies any recent procedure or self-catheterization.  Denies history of nephrolithiasis.  She has had an increase in peripheral edema as well as some shortness of breath and was seen by her cardiologist earlier today and diuretic dose was adjusted to manage this.  She does report fatigue and just feeling poorly but denies any fever.  Patient reports that she was at her dentist who noted that she had bilateral submandibular lymphadenopathy.  She denies any recent illness including cough, congestion, sore throat.  Denies any known sick contacts.  She denies any unintentional weight loss, fevers, night sweats.  She denies any recent antibiotics.  She is unsure how long this has been present as it was first brought to her attention by her dentist.    Past Medical History:  Diagnosis Date   Arthritis    knees   Asthma    Iron deficiency anemia    Morbid obesity ([place])    Rhabdomyolysis 04/2016   normal compartment pressures   Sleep apnea    uses cpap, setting is unknown, does not wear it consistently   Thyroid disease     Patient Active Problem List   Diagnosis Date Noted   Acute idiopathic gout of right hand 01/20/2020    Incidental pulmonary nodule 06/08/2017   Mediastinal lymphadenopathy 06/08/2017   Euthyroid sick syndrome    Elevated blood pressure reading without diagnosis of hypertension 03/14/2017   CHF exacerbation ([place]) 03/13/2017   Hypothyroidism 03/13/2017   OSA (obstructive sleep apnea) 03/13/2017   Anxiety 02/08/2012   Morbid obesity ([place]) 02/08/2012    Past Surgical History:  Procedure Laterality Date   CESAREAN SECTION     COLONOSCOPY WITH PROPOFOL N/A 07/21/2015   Procedure: COLONOSCOPY WITH PROPOFOL;  Surgeon: Juanita Craver, MD;  Location: [facility];  Service: Endoscopy;  Laterality: N/A;   DILATION AND CURETTAGE OF UTERUS     ENDOMETRIAL ABLATION     GANGLION CYST EXCISION     rt arm   SHOULDER SURGERY Left    Rotator cuff repair    OB History   No obstetric history on file.      Home Medications    Prior to Admission medications   Medication Sig Start Date End Date Taking? Authorizing Provider  Baclofen 5 MG TABS Take 1 tablet (5 mg total) by mouth at bedtime as needed. 08/25/22  Yes Zaylei Mullane, Derry Skill, PA-C  acetaminophen (TYLENOL) 325 MG tablet Take 650 mg by mouth every 6 (six) hours as needed (pain).     [provider]  albuterol (VENTOLIN HFA) 108 (90 Base) MCG/ACT inhaler Inhale 2 puffs into the lungs every 6 (six) hours as needed for wheezing or shortness of  breath (rescue).  06/20/14   [provider]  allopurinol (ZYLOPRIM) 100 MG tablet Take 100 mg by mouth as needed (for gout and kidney stones).    [provider]  aspirin EC 81 MG EC tablet Take 1 tablet (81 mg total) by mouth daily. 03/20/17   Eugenie Filler, MD  colchicine 0.6 MG tablet Take 0.6 mg by mouth as needed (for gout attack).    [provider]  diclofenac sodium (VOLTAREN) 1 % GEL Apply 2 g topically 2 (two) times daily as needed (pain). Applies to knees. 06/13/14   [provider]  ferrous sulfate 325 (65 FE) MG tablet Take 325 mg by mouth 3 (three) times  daily with meals.    [provider]  fluticasone (FLONASE) 50 MCG/ACT nasal spray Place 1 spray into both nostrils as needed for allergies. 06/07/22 06/07/23  [provider]  fluticasone-salmeterol (ADVAIR) 500-50 MCG/ACT AEPB Inhale 1 puff into the lungs 2 (two) times daily.    [provider]  furosemide (LASIX) 40 MG tablet Take 1 tablet (40 mg total) by mouth daily. May take one additional tablet as needed for shortness of breath or lower extremity edema. 08/25/22   Elgie Collard, PA-C  levothyroxine (SYNTHROID) 50 MCG tablet Take 50 mcg by mouth daily. 02/26/19   [provider]  montelukast (SINGULAIR) 10 MG tablet Take 10 mg by mouth at bedtime as needed (allergies.).     [provider]  Multiple Vitamin (MULTIVITAMIN) capsule Take 1 capsule by mouth daily.    [provider]  Omega-3 Fatty Acids (FISH OIL PO) Take 1 capsule by mouth daily.    [provider]  OZEMPIC, 0.25 OR 0.5 MG/DOSE, 2 MG/3ML SOPN Inject 0.5 mg into the skin once a week.    [provider]  potassium chloride SA (KLOR-CON M) 20 MEQ tablet Take 1 tablet (20 mEq total) by mouth 2 (two) times daily. 08/09/21   Jerline Pain, MD  rosuvastatin (CRESTOR) 5 MG tablet Take 1 tablet (5 mg total) by mouth daily. 08/25/22   Elgie Collard, PA-C  VICTOZA 18 MG/3ML SOPN Inject 1.8 mg into the skin daily. 01/23/20   [provider]    Family History Family History  Problem Relation Age of Onset   Heart disease Mother    Heart failure Mother        Around 93   Hypertension Brother    Hypertension Maternal Grandmother    Heart disease Maternal Grandmother    Breast cancer Maternal Aunt     Social History Social History   Tobacco Use   Smoking status: Never   Smokeless tobacco: Never  Vaping Use   Vaping Use: Never used  Substance Use Topics   Alcohol use: No   Drug use: No     Allergies   Sulfa antibiotics, Farxiga [dapagliflozin],  Levothyroxine sodium, Lipitor [atorvastatin], Metformin hcl, and Sulfamethoxazole-trimethoprim   Review of Systems Review of Systems  Constitutional:  Positive for activity change and fatigue. Negative for appetite change and fever.  HENT:  Negative for congestion, sinus pressure, sneezing and sore throat.   Respiratory:  Positive for shortness of breath (at baseline). Negative for cough.   Cardiovascular:  Positive for leg swelling (at baseline). Negative for chest pain.  Gastrointestinal:  Positive for abdominal pain. Negative for diarrhea, nausea and vomiting.  Genitourinary:  Positive for frequency (chronic due to fluid pills). Negative for dysuria, flank pain, urgency, vaginal bleeding, vaginal discharge  and vaginal pain.  Musculoskeletal:  Positive for back pain. Negative for arthralgias and myalgias.  Neurological:  Positive for headaches. Negative for dizziness and light-headedness.  Hematological:  Positive for adenopathy.     Physical Exam Triage Vital Signs ED Triage Vitals  Enc Vitals Group     BP 08/25/22 1822 136/74     Pulse Rate 08/25/22 1822 77     Resp 08/25/22 1822 (!) 22     Temp 08/25/22 1822 97.9 F (36.6 C)     Temp Source 08/25/22 1822 Oral     SpO2 08/25/22 1822 94 %     Weight --      Height --      Head Circumference --      Peak Flow --      Pain Score 08/25/22 1820 5     Pain Loc --      Pain Edu? --      Excl. in [place]? --    No data found.  Updated Vital Signs BP 136/74 (BP Location: Left Arm) Comment (BP Location): regular cuff on forearm  Pulse 77   Temp 97.9 F (36.6 C) (Oral)   Resp (!) 22   LMP 05/12/2012   SpO2 94%   Visual Acuity Right Eye Distance:   Left Eye Distance:   Bilateral Distance:    Right Eye Near:   Left Eye Near:    Bilateral Near:     Physical Exam Vitals reviewed.  Constitutional:      General: She is awake. She is not in acute distress.    Appearance: Normal appearance. She is well-developed. She is not  ill-appearing.     Comments: Very pleasant female appears stated age in no acute distress sitting comfortably in exam room  HENT:     Head: Normocephalic and atraumatic.     Right Ear: Tympanic membrane, ear canal and external ear normal. Tympanic membrane is not erythematous or bulging.     Left Ear: Tympanic membrane, ear canal and external ear normal. Tympanic membrane is not erythematous or bulging.     Nose:     Right Sinus: No maxillary sinus tenderness or frontal sinus tenderness.     Left Sinus: No maxillary sinus tenderness or frontal sinus tenderness.     Mouth/Throat:     Pharynx: Uvula midline. No oropharyngeal exudate or posterior oropharyngeal erythema.  Cardiovascular:     Rate and Rhythm: Normal rate and regular rhythm.     Heart sounds: Normal heart sounds, S1 normal and S2 normal. No murmur heard. Pulmonary:     Effort: Pulmonary effort is normal.     Breath sounds: Normal breath sounds. No wheezing, rhonchi or rales.     Comments: Clear to auscultation bilaterally Abdominal:     General: Bowel sounds are normal.     Palpations: Abdomen is soft.     Tenderness: There is no abdominal tenderness.     Comments: Benign abdominal exam  Musculoskeletal:     Cervical back: No tenderness or bony tenderness.     Thoracic back: Tenderness present. No bony tenderness.     Lumbar back: No tenderness or bony tenderness.     Comments: Mild tender palpation over bilateral thoracic paraspinal muscles.  No deformity or step-off noted.  No pain percussion of vertebrae.  Lymphadenopathy:     Head:     Right side of head: Submandibular adenopathy present. No submental or tonsillar adenopathy.     Left side of head: Submandibular  adenopathy present. No submental or tonsillar adenopathy.     Cervical: No cervical adenopathy.     Comments: Approximately 1 cm mobile well-defined submandibular lymph node noted bilaterally.  Psychiatric:        Behavior: Behavior is cooperative.       UC Treatments / Results  Labs (all labs ordered are listed, but only abnormal results are displayed) Labs Reviewed  CBC WITH DIFFERENTIAL/PLATELET  COMPREHENSIVE METABOLIC PANEL  HIV ANTIBODY (ROUTINE TESTING W REFLEX)  ANTISTREPTOLYSIN O TITER  POCT URINALYSIS DIPSTICK, ED / UC    EKG   Radiology No results found.  Procedures Procedures (including critical care time)  Medications Ordered in UC Medications - No data to display  Initial Impression / Assessment and Plan / UC Course  I have reviewed the triage vital signs and the nursing notes.  Pertinent labs & imaging results that were available during my care of the patient were reviewed by me and considered in my medical decision making (see chart for details).     Patient is well-appearing, afebrile, nontoxic, nontachycardic.  Urine was obtained given thoracic back pain which showed no evidence of infection.  She did have tenderness to palpation over muscles with worsening symptoms associated with changing position so concern for musculoskeletal etiology.  She is on to take NSAIDs due to history of cardiovascular disease.  Recommended that she use conservative treatment measures including topical medications as well as Tylenol.  She was prescribed low-dose baclofen (5 mg at night) to help manage her pain.  Discussed that this is sedating and she should not drive or drink alcohol with taking it.  If her symptoms persist she should follow-up with sports medicine was given contact information for local provider with instruction to call to schedule an appointment.  Discussed that if she has any worsening or changing symptoms she needs to be seen immediately.  No evidence of acute infection that would warrant initiation of antibiotics.  Unclear etiology of symptoms.  Basic blood work including CBC, CMP, HIV obtained as well as ASO titer.  Discussed that this generally resolves on its own within 4 to 6 weeks and recommended she  follow-up with her primary care as scheduled later this month on 09/21/2022.  Discussed that if symptoms persist we may need to consider imaging which we do not have available in urgent care.  Discussed that if she has any worsening symptoms including enlarging lesions, fever, dysphagia, odynophagia, shortness of breath, swelling of her throat, unintentional weight loss she should be reevaluated immediately.  Strict return precautions given.  Final Clinical Impressions(s) / UC Diagnoses   Final diagnoses:  Acute bilateral thoracic back pain  Submandibular lymphadenopathy     Discharge Instructions      Your urine was normal with no evidence of infection.  I am concerned that the back pain is related to muscles.  Continue using her Tylenol.  Use heat and gentle stretch for symptom relief.  Take baclofen at night.  This will make you sleepy so do not drive or drink alcohol with taking it.  If your symptoms or not improving please follow-up with sports medicine; call to schedule an appointment.  If anything worsens you should be seen immediately.  You do have some enlarged lymph nodes.  Generally this will go away on its own in 4 to 6 weeks.  I do not see any signs of infection that warrant initiation of antibiotics.  We drew some basic labs.  Follow-up with your primary care  on the 28th as you are scheduled to ensure that this goes away and determine if we need to do any additional workup including imaging.  If you have any worsening symptoms including enlarging lymph nodes, fever, sore throat you should be seen immediately.     ED Prescriptions     Medication Sig Dispense Auth. Provider   Baclofen 5 MG TABS Take 1 tablet (5 mg total) by mouth at bedtime as needed. 7 tablet Emmeline Winebarger, Derry Skill, PA-C      PDMP not reviewed this encounter.   Terrilee Croak, PA-C 08/25/22 1932

## 2022-08-25 NOTE — Discharge Instructions (Addendum)
Your urine was normal with no evidence of infection.  I am concerned that the back pain is related to muscles.  Continue using her Tylenol.  Use heat and gentle stretch for symptom relief.  Take baclofen at night.  This will make you sleepy so do not drive or drink alcohol with taking it.  If your symptoms or not improving please follow-up with sports medicine; call to schedule an appointment.  If anything worsens you should be seen immediately.  You do have some enlarged lymph nodes.  Generally this will go away on its own in 4 to 6 weeks.  I do not see any signs of infection that warrant initiation of antibiotics.  We drew some basic labs.  Follow-up with your primary care on the 28th as you are scheduled to ensure that this goes away and determine if we need to do any additional workup including imaging.  If you have any worsening symptoms including enlarging lymph nodes, fever, sore throat you should be seen immediately.

## 2022-08-25 NOTE — Patient Instructions (Addendum)
Medication Instructions:  1.Increase furosemide (Lasix) to 40 mg daily and take an additional 40 mg as needed for shortness of breath or lower extremity swelling. 2.Start rosuvastatin (Crestor) 5 mg daily *If you need a refill on your cardiac medications before your next appointment, please call your pharmacy*   Lab Work: Fasting lipid panel in 2 months If you have labs (blood work) drawn today and your tests are completely normal, you will receive your results only by: [place] (if you have MyChart) OR A paper copy in the mail If you have any lab test that is abnormal or we need to change your treatment, we will call you to review the results.   Testing/Procedures: Your physician has requested that you have an echocardiogram. Echocardiography is a painless test that uses sound waves to create images of your heart. It provides your doctor with information about the size and shape of your heart and how well your heart's chambers and valves are working. This procedure takes approximately one hour. There are no restrictions for this procedure. Please do NOT wear cologne, perfume, aftershave, or lotions (deodorant is allowed). Please arrive 15 minutes prior to your appointment time.    Follow-Up: At [facility], you and your health needs are our priority.  As part of our continuing mission to provide you with exceptional heart care, we have created designated Provider Care Teams.  These Care Teams include your primary Cardiologist (physician) and Advanced Practice Providers (APPs -  Physician Assistants and Nurse Practitioners) who all work together to provide you with the care you need, when you need it.   Your next appointment:   2-3 month(s)  Provider:   Candee Furbish, MD  or Nicholes Rough, PA-C       Other Instructions Wear compression socks or stockings when up and moving around during the day and remove them at night.

## 2022-08-27 LAB — ANTISTREPTOLYSIN O TITER: ASO: 31 IU/mL (ref 0.0–200.0)

## 2022-09-22 ENCOUNTER — Ambulatory Visit (HOSPITAL_COMMUNITY): Payer: 59 | Attending: Physician Assistant

## 2022-09-22 DIAGNOSIS — I5032 Chronic diastolic (congestive) heart failure: Secondary | ICD-10-CM | POA: Diagnosis present

## 2022-09-23 LAB — ECHOCARDIOGRAM COMPLETE
AR max vel: 2.01 cm2
AV Area VTI: 2.11 cm2
AV Area mean vel: 2.09 cm2
AV Mean grad: 9.4 mmHg
AV Peak grad: 18.9 mmHg
Ao pk vel: 2.17 m/s
Area-P 1/2: 2.42 cm2
S' Lateral: 2.8 cm

## 2022-10-24 ENCOUNTER — Ambulatory Visit: Payer: 59 | Attending: Physician Assistant

## 2022-10-24 DIAGNOSIS — I5032 Chronic diastolic (congestive) heart failure: Secondary | ICD-10-CM

## 2022-10-24 DIAGNOSIS — E785 Hyperlipidemia, unspecified: Secondary | ICD-10-CM

## 2022-10-24 DIAGNOSIS — G4733 Obstructive sleep apnea (adult) (pediatric): Secondary | ICD-10-CM

## 2022-10-25 LAB — LIPID PANEL
Chol/HDL Ratio: 3.7 ratio (ref 0.0–4.4)
Cholesterol, Total: 180 mg/dL (ref 100–199)
HDL: 49 mg/dL (ref >39)
LDL Chol Calc (NIH): 104 mg/dL — ABNORMAL HIGH (ref 0–99)
Triglycerides: 153 mg/dL — ABNORMAL HIGH (ref 0–149)
VLDL Cholesterol Cal: 27 mg/dL (ref 5–40)

## 2022-11-07 ENCOUNTER — Encounter: Payer: Self-pay | Admitting: *Deleted

## 2022-11-23 NOTE — Progress Notes (Unsigned)
Office Visit    Patient Name: Theresa Kirby Date of Encounter: 11/24/2022  PCP:  Theresa Bouchard, MD (Inactive)   [facility]  Cardiologist:  Theresa Schultz, MD  Advanced Practice Provider:  No care team member to display Electrophysiologist:  None   HPI    Theresa Kirby is a 67 y.o. female with a past medical history of chronic diastolic CHF, morbid obesity, OSA on CPAP, HLD and IDA presents today for follow-up appointment.   She was last seen 06/01/2022 and at that time had reported chronic lower extremity edema.  She been compliant with a low-sodium diet.  She did take Lasix.  She did follow-up regularly with her PCP.  Plan for blood work at that time.  She was compliant with her CPAP.  She was prescribed an extra tablet of Lasix as needed for lower extremity edema and asked to elevate her legs.  Compression stockings were advised.  Continuing of low-sodium diet.  Theresa Kirby was seen by me 08/25/22,  she tells me that she is having more shortness of breath.  She also is having some swelling in her legs which occur more commonly at the end of the day.  Usually the swelling resolves overnight when her feet are elevated in the bed.  We discussed options for medical therapy for diastolic dysfunction that was noted on her echocardiogram back in 2018.  Unfortunately, she was started on Farxiga back in 2019 by her endocrinologist and experienced blisters all over her chest so this is not a medication option for her.  Her blood pressure is not high enough to start Entresto today.  I think it would be reasonable to update an echocardiogram today since it has been a few years.  We also discussed her most recent lipid panel.  She has not been taking Lipitor due to muscle aches.  We discussed trying Crestor and getting a repeat lipid panel in 2 months.  She went to urgent care later that day (08/25/2022) for a chief complaint of thoracic/lumbar pain.  Pain was described as a 5 out  of 10.  She did have increased peripheral edema as well as shortness of breath and her diuretics were adjusted earlier that day by myself.  She did report fatigue and just feeling poorly but no fever.  She was told to take Tylenol, topical medications, and was given a muscle relaxer to manage her pain.  Today, she tells me that her swelling has been better.  She does have continued shortness of breath but it is hard to know if it is from allergies.  She is on a couple of inhaled medications.  She has been on daily Lasix (40 mg).  Occasionally she does need to take an extra Lasix every couple of weeks.  Overall, fluid status has been well-controlled.  She has done better on Crestor for her cholesterol and recent numbers are at goal.  We again discussed lower extremity compression for her edema especially if she is going to be up and active during the day.  Reports no shortness of breath nor dyspnea on exertion. Reports no chest pain, pressure, or tightness. No orthopnea, PND. Reports no palpitations.   Past Medical History    Past Medical History:  Diagnosis Date   Arthritis    knees   Asthma    Iron deficiency anemia    Morbid obesity (HCC)    Rhabdomyolysis 04/2016   normal compartment pressures   Sleep apnea  uses cpap, setting is unknown, does not wear it consistently   Thyroid disease    Past Surgical History:  Procedure Laterality Date   CESAREAN SECTION     COLONOSCOPY WITH PROPOFOL N/A 07/21/2015   Procedure: COLONOSCOPY WITH PROPOFOL;  Surgeon: Theresa Elizabeth, MD;  Location: [facility];  Service: Endoscopy;  Laterality: N/A;   DILATION AND CURETTAGE OF UTERUS     ENDOMETRIAL ABLATION     GANGLION CYST EXCISION     rt arm   SHOULDER SURGERY Left    Rotator cuff repair    Allergies  Allergies  Allergen Reactions   Sulfa Antibiotics Hives and Shortness Of Breath   Farxiga [Dapagliflozin]     Blisters   Levothyroxine Sodium     Other reaction(s): Abd pain   Lipitor  [Atorvastatin]     Muscle ache   Metformin Hcl     Other reaction(s): Abd discomfort   Sulfamethoxazole-Trimethoprim     Other reaction(s): hives    EKGs/Labs/Other Studies Reviewed:   The following studies were reviewed today: No recent studies  EKG:  EKG is not ordered today.  Recent Labs: 08/25/2022: ALT 10; BUN 17; Creatinine, Ser 0.88; Hemoglobin 13.9; Platelets 229; Potassium 3.9; Sodium 139  Recent Lipid Panel    Component Value Date/Time   CHOL 180 10/24/2022 0925   TRIG 153 (H) 10/24/2022 0925   HDL 49 10/24/2022 0925   CHOLHDL 3.7 10/24/2022 0925   CHOLHDL 3.8 03/14/2017 0427   VLDL 21 03/14/2017 0427   LDLCALC 104 (H) 10/24/2022 0925     Home Medications   Current Meds  Medication Sig   acetaminophen (TYLENOL) 325 MG tablet Take 650 mg by mouth every 6 (six) hours as needed (pain).    albuterol (VENTOLIN HFA) 108 (90 Base) MCG/ACT inhaler Inhale 2 puffs into the lungs every 6 (six) hours as needed for wheezing or shortness of breath (rescue).    allopurinol (ZYLOPRIM) 100 MG tablet Take 100 mg by mouth as needed (for gout and kidney stones).   aspirin EC 81 MG EC tablet Take 1 tablet (81 mg total) by mouth daily.   colchicine 0.6 MG tablet Take 0.6 mg by mouth as needed (for gout attack).   diclofenac sodium (VOLTAREN) 1 % GEL Apply 2 g topically 2 (two) times daily as needed (pain). Applies to knees.   ferrous sulfate 325 (65 FE) MG tablet Take 325 mg by mouth 3 (three) times daily with meals.   fluticasone (FLONASE) 50 MCG/ACT nasal spray Place 1 spray into both nostrils as needed for allergies.   fluticasone-salmeterol (ADVAIR) 500-50 MCG/ACT AEPB Inhale 1 puff into the lungs 2 (two) times daily.   furosemide (LASIX) 40 MG tablet Take 1 tablet (40 mg total) by mouth daily. May take one additional tablet as needed for shortness of breath or lower extremity edema.   levothyroxine (SYNTHROID) 50 MCG tablet Take 50 mcg by mouth daily.   montelukast (SINGULAIR) 10  MG tablet Take 10 mg by mouth at bedtime as needed (allergies.).    Multiple Vitamin (MULTIVITAMIN) capsule Take 1 capsule by mouth daily.   Omega-3 Fatty Acids (FISH OIL PO) Take 1 capsule by mouth daily.   OZEMPIC, 0.25 OR 0.5 MG/DOSE, 2 MG/3ML SOPN Inject 0.5 mg into the skin once a week.   potassium chloride SA (KLOR-CON M) 20 MEQ tablet Take 1 tablet (20 mEq total) by mouth 2 (two) times daily.   rosuvastatin (CRESTOR) 5 MG tablet Take 1 tablet (5 mg total)  by mouth daily.   [DISCONTINUED] Baclofen 5 MG TABS Take 1 tablet (5 mg total) by mouth at bedtime as needed.   [DISCONTINUED] VICTOZA 18 MG/3ML SOPN Inject 1.8 mg into the skin daily.     Review of Systems      All other systems reviewed and are otherwise negative except as noted above.  Physical Exam    VS:  BP 118/74   Pulse 76   Ht 5\' 4"  (1.626 m)   Wt (!) 324 lb (147 kg)   LMP 05/12/2012   SpO2 90%   BMI 55.61 kg/m  , BMI Body mass index is 55.61 kg/m.  Wt Readings from Last 3 Encounters:  11/24/22 (!) 324 lb (147 kg)  08/25/22 (!) 321 lb 12.8 oz (146 kg)  06/01/22 (!) 318 lb 6.4 oz (144.4 kg)     GEN: Well nourished, well developed, in no acute distress. HEENT: normal. Neck: Supple, no JVD, carotid bruits Cardiac: RRR, no murmurs, rubs, or gallops. No clubbing, cyanosis, 1 + bilateral pitting edema.  Radials/PT 2+ and equal bilaterally.  Respiratory:  Respirations regular and unlabored, clear to auscultation bilaterally. GI: Soft, nontender, nondistended. MS: No deformity or atrophy. Skin: Warm and dry, no rash. Neuro:  Strength and sensation are intact. Psych: Normal affect.  Assessment & Plan    Chronic diastolic heart failure -she is having SOB and some edema especially at the end of the day -She has a history of allergic reaction to [place] -continue  Lasix 40 mg for swelling so encouraged her to take an extra as needed, she does this every couple of weeks -Sherryll Burger may be an option for her down the  road but right now her blood pressure is low normal -I have encouraged her to wear compression hose or socks and keep feet elevated when possible -reviewed her echo with her with grade II DD  Obstructive sleep apnea with CPAP -compliant with CPAP  Mixed hyperlipidemia -Lipid panel April 2024 LDL 104 and triglycerides 161-[id number] better then previous -continue crestor 5mg  daily -lipid panel in a year  Lower ext edema -This seems to be a chronic issue -She is encouraged to take an extra 40 mg of Lasix as needed for lower extremity swelling -Grade II DD     Disposition: Follow up 6 months with Theresa Schultz, MD or APP.  Signed, Sharlene Dory, PA-C 11/24/2022, 11:04 AM [facility]

## 2022-11-24 ENCOUNTER — Ambulatory Visit: Payer: 59 | Attending: Physician Assistant | Admitting: Physician Assistant

## 2022-11-24 ENCOUNTER — Encounter: Payer: Self-pay | Admitting: Physician Assistant

## 2022-11-24 VITALS — BP 118/74 | HR 76 | Ht 64.0 in | Wt 324.0 lb

## 2022-11-24 DIAGNOSIS — R600 Localized edema: Secondary | ICD-10-CM

## 2022-11-24 DIAGNOSIS — I5032 Chronic diastolic (congestive) heart failure: Secondary | ICD-10-CM

## 2022-11-24 DIAGNOSIS — E785 Hyperlipidemia, unspecified: Secondary | ICD-10-CM | POA: Diagnosis not present

## 2022-11-24 DIAGNOSIS — G4733 Obstructive sleep apnea (adult) (pediatric): Secondary | ICD-10-CM | POA: Diagnosis not present

## 2022-11-24 DIAGNOSIS — R599 Enlarged lymph nodes, unspecified: Secondary | ICD-10-CM

## 2022-11-24 NOTE — Patient Instructions (Addendum)
Medication Instructions:   Your physician recommends that you continue on your current medications as directed. Please refer to the Current Medication list given to you today.   *If you need a refill on your cardiac medications before your next appointment, please call your pharmacy*   Lab Work:  NONE ORDERED  TODAY    If you have labs (blood work) drawn today and your tests are completely normal, you will receive your results only by: MyChart Message (if you have MyChart) OR A paper copy in the mail If you have any lab test that is abnormal or we need to change your treatment, we will call you to review the results.   Testing/Procedures:  NONE ORDERED  TODAY    Follow-Up: At [facility], you and your health needs are our priority.  As part of our continuing mission to provide you with exceptional heart care, we have created designated Provider Care Teams.  These Care Teams include your primary Cardiologist (physician) and Advanced Practice Providers (APPs -  Physician Assistants and Nurse Practitioners) who all work together to provide you with the care you need, when you need it.  We recommend signing up for the patient portal called "MyChart".  Sign up information is provided on this After Visit Summary.  MyChart is used to connect with patients for Virtual Visits (Telemedicine).  Patients are able to view lab/test results, encounter notes, upcoming appointments, etc.  Non-urgent messages can be sent to your provider as well.   To learn more about what you can do with MyChart, go to [email or web address].    Your next appointment:    6 month(s)  Provider:    Donato Schultz, MD     Other Instructions

## 2023-01-05 ENCOUNTER — Encounter (HOSPITAL_COMMUNITY): Payer: Self-pay | Admitting: Gastroenterology

## 2023-01-05 ENCOUNTER — Other Ambulatory Visit: Payer: Self-pay | Admitting: Gastroenterology

## 2023-01-10 ENCOUNTER — Ambulatory Visit (HOSPITAL_BASED_OUTPATIENT_CLINIC_OR_DEPARTMENT_OTHER): Payer: Medicare Other | Admitting: Certified Registered Nurse Anesthetist

## 2023-01-10 ENCOUNTER — Encounter (HOSPITAL_COMMUNITY): Payer: Self-pay | Admitting: Gastroenterology

## 2023-01-10 ENCOUNTER — Other Ambulatory Visit: Payer: Self-pay

## 2023-01-10 ENCOUNTER — Ambulatory Visit (HOSPITAL_COMMUNITY): Payer: Medicare Other | Admitting: Certified Registered Nurse Anesthetist

## 2023-01-10 ENCOUNTER — Encounter (HOSPITAL_COMMUNITY)
Admission: RE | Disposition: A | Discharge status: Home / Self Care | Payer: Self-pay | Source: Home / Self Care | Attending: Gastroenterology

## 2023-01-10 ENCOUNTER — Ambulatory Visit (HOSPITAL_COMMUNITY)
Admission: RE | Admit: 2023-01-10 | Discharge: 2023-01-10 | Disposition: A | Discharge status: Home / Self Care | Payer: Medicare Other | Attending: Gastroenterology | Admitting: Gastroenterology

## 2023-01-10 DIAGNOSIS — Z1211 Encounter for screening for malignant neoplasm of colon: Secondary | ICD-10-CM | POA: Insufficient documentation

## 2023-01-10 DIAGNOSIS — Z6841 Body Mass Index (BMI) 40.0 and over, adult: Secondary | ICD-10-CM | POA: Diagnosis not present

## 2023-01-10 DIAGNOSIS — K2950 Unspecified chronic gastritis without bleeding: Secondary | ICD-10-CM | POA: Insufficient documentation

## 2023-01-10 DIAGNOSIS — E119 Type 2 diabetes mellitus without complications: Secondary | ICD-10-CM | POA: Diagnosis not present

## 2023-01-10 DIAGNOSIS — D123 Benign neoplasm of transverse colon: Secondary | ICD-10-CM | POA: Diagnosis not present

## 2023-01-10 DIAGNOSIS — I10 Essential (primary) hypertension: Secondary | ICD-10-CM | POA: Diagnosis not present

## 2023-01-10 DIAGNOSIS — K319 Disease of stomach and duodenum, unspecified: Secondary | ICD-10-CM | POA: Diagnosis not present

## 2023-01-10 DIAGNOSIS — K229 Disease of esophagus, unspecified: Secondary | ICD-10-CM

## 2023-01-10 DIAGNOSIS — D126 Benign neoplasm of colon, unspecified: Secondary | ICD-10-CM

## 2023-01-10 DIAGNOSIS — K648 Other hemorrhoids: Secondary | ICD-10-CM | POA: Insufficient documentation

## 2023-01-10 DIAGNOSIS — J449 Chronic obstructive pulmonary disease, unspecified: Secondary | ICD-10-CM

## 2023-01-10 DIAGNOSIS — Z7985 Long-term (current) use of injectable non-insulin antidiabetic drugs: Secondary | ICD-10-CM | POA: Diagnosis not present

## 2023-01-10 DIAGNOSIS — G4730 Sleep apnea, unspecified: Secondary | ICD-10-CM | POA: Diagnosis not present

## 2023-01-10 DIAGNOSIS — J4489 Other specified chronic obstructive pulmonary disease: Secondary | ICD-10-CM | POA: Insufficient documentation

## 2023-01-10 DIAGNOSIS — E039 Hypothyroidism, unspecified: Secondary | ICD-10-CM | POA: Diagnosis not present

## 2023-01-10 DIAGNOSIS — Z86010 Personal history of colonic polyps: Secondary | ICD-10-CM

## 2023-01-10 HISTORY — PX: COLONOSCOPY WITH PROPOFOL: SHX5780

## 2023-01-10 HISTORY — PX: ESOPHAGOGASTRODUODENOSCOPY (EGD) WITH PROPOFOL: SHX5813

## 2023-01-10 HISTORY — PX: POLYPECTOMY: SHX5525

## 2023-01-10 HISTORY — PX: BIOPSY: SHX5522

## 2023-01-10 LAB — GLUCOSE, CAPILLARY: Glucose-Capillary: 129 mg/dL — ABNORMAL HIGH (ref 70–99)

## 2023-01-10 SURGERY — COLONOSCOPY WITH PROPOFOL
Anesthesia: Monitor Anesthesia Care

## 2023-01-10 MED ORDER — ONDANSETRON HCL 4 MG/2ML IJ SOLN
INTRAMUSCULAR | Status: DC | PRN
  Administered 2023-01-10: 4 mg via INTRAVENOUS

## 2023-01-10 MED ORDER — LACTATED RINGERS IV SOLN: INTRAVENOUS | Status: DC

## 2023-01-10 MED ORDER — PROPOFOL 10 MG/ML IV BOLUS
INTRAVENOUS | Status: DC | PRN
  Administered 2023-01-10: 30 mg via INTRAVENOUS

## 2023-01-10 MED ORDER — SODIUM CHLORIDE 0.9 % IV SOLN: INTRAVENOUS | Status: DC

## 2023-01-10 MED ORDER — LIDOCAINE 2% (20 MG/ML) 5 ML SYRINGE
INTRAMUSCULAR | Status: DC | PRN
  Administered 2023-01-10: 80 mg via INTRAVENOUS

## 2023-01-10 MED ORDER — PROPOFOL 500 MG/50ML IV EMUL
INTRAVENOUS | Status: DC | PRN
  Administered 2023-01-10: 150 ug/kg/min via INTRAVENOUS

## 2023-01-10 MED ORDER — PROPOFOL 1000 MG/100ML IV EMUL
INTRAVENOUS | Status: AC
  Filled 2023-01-10: qty 100

## 2023-01-10 SURGICAL SUPPLY — 25 items

## 2023-01-10 NOTE — Anesthesia Procedure Notes (Signed)
Procedure Name: MAC Date/Time: 01/10/2023 11:02 AM  Performed by: Orest Dikes, CRNAPre-anesthesia Checklist: Patient identified, Emergency Drugs available, Patient being monitored and Suction available Oxygen Delivery Method: Simple face mask

## 2023-01-10 NOTE — Op Note (Signed)
[facility] Patient Name: Theresa Kirby Procedure Date: 01/10/2023 MRN: [id number] Attending MD: Kathi Der , MD, [id number] Date of Birth: 02-03-1956 CSN: [id number] Age: 67 Admit Type: Outpatient Procedure:                Colonoscopy Indications:              High risk colon cancer surveillance: Personal                            history of colonic polyps Providers:                Kathi Der, MD, Martha Clan, RN,                            Lorenza Evangelist, RN, Leanne Lovely, Technician,                            Waymond Cera, CRNA Referring MD:              Medicines:                Sedation Administered by an Anesthesia Professional Complications:            No immediate complications. Estimated Blood Loss:     Estimated blood loss was minimal. Procedure:                Pre-Anesthesia Assessment:                           - Prior to the procedure, a History and Physical                            was performed, and patient medications and                            allergies were reviewed. The patient's tolerance of                            previous anesthesia was also reviewed. The risks                            and benefits of the procedure and the sedation                            options and risks were discussed with the patient.                            All questions were answered, and informed consent                            was obtained. Prior Anticoagulants: The patient has                            taken no anticoagulant or antiplatelet agents. ASA  Grade Assessment: IV - A patient with severe                            systemic disease that is a constant threat to life.                            After reviewing the risks and benefits, the patient                            was deemed in satisfactory condition to undergo the                            procedure.                           After  obtaining informed consent, the colonoscope                            was passed under direct vision. Throughout the                            procedure, the patient's blood pressure, pulse, and                            oxygen saturations were monitored continuously. The                            [id number]-HQ190L ([id number]) Olympus colonoscope was                            introduced through the anus and advanced to the the                            cecum, identified by appendiceal orifice and                            ileocecal valve. The colonoscopy was performed with                            moderate difficulty due to the patient's body                            habitus. Successful completion of the procedure was                            aided by applying abdominal pressure. The patient                            tolerated the procedure well. The quality of the                            bowel preparation was fair. The ileocecal valve,  appendiceal orifice, and rectum were photographed. Scope In: 11:20:54 AM Scope Out: 11:36:00 AM Scope Withdrawal Time: 0 hours 7 minutes 40 seconds  Total Procedure Duration: 0 hours 15 minutes 6 seconds  Findings:      Hemorrhoids were found on perianal exam.      A 10 mm polyp was found in the hepatic flexure. The polyp was       pedunculated. The polyp was removed with a hot snare. Resection and       retrieval were complete.      Internal hemorrhoids were found during retroflexion. The hemorrhoids       were medium-sized. Impression:               - Preparation of the colon was fair.                           - Hemorrhoids found on perianal exam.                           - One 10 mm polyp at the hepatic flexure, removed                            with a hot snare. Resected and retrieved.                           - Internal hemorrhoids. Moderate Sedation:      Moderate (conscious) sedation was personally  administered by an       anesthesia professional. The following parameters were monitored: oxygen       saturation, heart rate, blood pressure, and response to care. Recommendation:           - Patient has a contact number available for                            emergencies. The signs and symptoms of potential                            delayed complications were discussed with the                            patient. Return to normal activities tomorrow.                            Written discharge instructions were provided to the                            patient.                           - Resume previous diet.                           - Continue present medications.                           - Await pathology results.                           -  Repeat colonoscopy date to be determined after                            pending pathology results are reviewed for                            surveillance.                           - No ibuprofen, naproxen, or other non-steroidal                            anti-inflammatory drugs for 7 days after polyp                            removal. Procedure Code(s):        --- Professional ---                           [id number], Colonoscopy, flexible; with removal of                            tumor(s), polyp(s), or other lesion(s) by snare                            technique Diagnosis Code(s):        --- Professional ---                           Z86.010, Personal history of colonic polyps                           K64.8, Other hemorrhoids                           D12.3, Benign neoplasm of transverse colon (hepatic                            flexure or splenic flexure) CPT copyright 2022 American Medical Association. All rights reserved. The codes documented in this report are preliminary and upon coder review may  be revised to meet current compliance requirements. Kathi Der, MD Kathi Der, MD 01/10/2023 11:45:48 AM Number of  Addenda: 0

## 2023-01-10 NOTE — Anesthesia Postprocedure Evaluation (Signed)
Anesthesia Post Note  Patient: Theresa Kirby  Procedure(s) Performed: COLONOSCOPY WITH PROPOFOL ESOPHAGOGASTRODUODENOSCOPY (EGD) WITH PROPOFOL BIOPSY POLYPECTOMY     Patient location during evaluation: Endoscopy Anesthesia Type: MAC Level of consciousness: awake and alert, patient cooperative and oriented Pain management: pain level controlled Vital Signs Assessment: post-procedure vital signs reviewed and stable Respiratory status: nonlabored ventilation, spontaneous breathing and respiratory function stable Cardiovascular status: blood pressure returned to baseline and stable Postop Assessment: no apparent nausea or vomiting Anesthetic complications: no   No notable events documented.  Last Vitals:  Vitals:   01/10/23 1142 01/10/23 1150  BP: 125/63 (!) 128/57  Pulse: 71 67  Resp: (!) 27 (!) 25  Temp: 36.5 C   SpO2: 98% 92%    Last Pain:  Vitals:   01/10/23 1142  TempSrc: Temporal  PainSc: 0-No pain                 Amylee Lodato,E. Fanny Agan

## 2023-01-10 NOTE — Op Note (Signed)
[facility] Patient Name: Theresa Kirby Procedure Date: 01/10/2023 MRN: [id number] Attending MD: Kathi Der , MD, [id number] Date of Birth: 1956/07/20 CSN: [id number] Age: 67 Admit Type: Outpatient Procedure:                Upper GI endoscopy Indications:              Suspected gastro-esophageal reflux disease Providers:                Kathi Der, MD, Martha Clan, RN,                            Lorenza Evangelist, RN, Leanne Lovely, Technician,                            Waymond Cera, CRNA Referring MD:              Medicines:                Sedation Administered by an Anesthesia Professional Complications:            No immediate complications. Estimated Blood Loss:     Estimated blood loss was minimal. Procedure:                Pre-Anesthesia Assessment:                           - Prior to the procedure, a History and Physical                            was performed, and patient medications and                            allergies were reviewed. The patient's tolerance of                            previous anesthesia was also reviewed. The risks                            and benefits of the procedure and the sedation                            options and risks were discussed with the patient.                            All questions were answered, and informed consent                            was obtained. Prior Anticoagulants: The patient has                            taken no anticoagulant or antiplatelet agents. ASA                            Grade Assessment: IV - A patient with severe  systemic disease that is a constant threat to life.                            After reviewing the risks and benefits, the patient                            was deemed in satisfactory condition to undergo the                            procedure.                           After obtaining informed consent, the endoscope was                             passed under direct vision. Throughout the                            procedure, the patient's blood pressure, pulse, and                            oxygen saturations were monitored continuously. The                            [id number]-[id number] ([id number]) Olympus endoscope was introduced                            through the mouth, and advanced to the second part                            of duodenum. The upper GI endoscopy was                            accomplished without difficulty. The patient                            tolerated the procedure well. Scope In: Scope Out: Findings:      Patchy, white plaques were found in the entire esophagus. Biopsies were       taken with a cold forceps for histology.      The Z-line was regular and was found 39 cm from the incisors.      Scattered mild inflammation characterized by congestion (edema) and       erythema was found in the entire examined stomach. Biopsies were taken       with a cold forceps for histology.      The cardia and gastric fundus were normal on retroflexion.      The duodenal bulb, first portion of the duodenum and second portion of       the duodenum were normal. Impression:               - Esophageal plaques were found, suspicious for                            candidiasis. Biopsied.                           -  Z-line regular, 39 cm from the incisors.                           - Chronic gastritis. Biopsied.                           - Normal duodenal bulb, first portion of the                            duodenum and second portion of the duodenum. Moderate Sedation:      Moderate (conscious) sedation was personally administered by an       anesthesia professional. The following parameters were monitored: oxygen       saturation, heart rate, blood pressure, and response to care. Recommendation:           - Perform a colonoscopy today. Procedure Code(s):        --- Professional ---                            [id number], Esophagogastroduodenoscopy, flexible,                            transoral; with biopsy, single or multiple Diagnosis Code(s):        --- Professional ---                           K22.9, Disease of esophagus, unspecified                           K29.50, Unspecified chronic gastritis without                            bleeding CPT copyright 2022 American Medical Association. All rights reserved. The codes documented in this report are preliminary and upon coder review may  be revised to meet current compliance requirements. Kathi Der, MD Kathi Der, MD 01/10/2023 11:42:47 AM Number of Addenda: 0

## 2023-01-10 NOTE — H&P (Signed)
Primary Care Physician:  Laruth Bouchard, MD (Inactive) Primary Gastroenterologist:  Dr. Levora Angel  Reason for visit : Outpatient endoscopy and colonoscopy  HPI: Theresa Kirby is a 67 y.o. female   This is a 67 year old patient referred to GI for repeat colonoscopy.  Past medical history of morbid obesity, diabetes and asthma.  History of sleep apnea currently uses CPAP. Patient with personal history of colon polyps. Previous colonoscopy was done by Dr. Loreta Ave. According to patient, she received letter from Dr. Loreta Ave to schedule repeat colonoscopy because of personal history of colon polyps.  She has been complaining of reflux symptoms for several years now. Described as epigastric burning sensation which radiates towards substernal area as well as towards umbilical area.  Symptoms usually worse with food intake.  Denies any nausea.  She is currently on Ozempic and trying to lose weight.  Denies any diarrhea or constipation. Denies any blood in the stool or black stool.  No Family history of colon cancer.  Past Medical History:  Diagnosis Date   Arthritis    knees   Asthma    Iron deficiency anemia    Morbid obesity (HCC)    Rhabdomyolysis 04/2016   normal compartment pressures   Sleep apnea    uses cpap, setting is unknown, does not wear it consistently   Thyroid disease     Past Surgical History:  Procedure Laterality Date   CESAREAN SECTION     COLONOSCOPY WITH PROPOFOL N/A 07/21/2015   Procedure: COLONOSCOPY WITH PROPOFOL;  Surgeon: Charna Elizabeth, MD;  Location: [facility];  Service: Endoscopy;  Laterality: N/A;   DILATION AND CURETTAGE OF UTERUS     ENDOMETRIAL ABLATION     GANGLION CYST EXCISION     rt arm   SHOULDER SURGERY Left    Rotator cuff repair    Prior to Admission medications   Medication Sig Start Date End Date Taking? Authorizing Provider  acetaminophen (TYLENOL) 325 MG tablet Take 650 mg by mouth every 6 (six) hours as needed (pain).    Yes [provider]  albuterol (VENTOLIN HFA) 108 (90 Base) MCG/ACT inhaler Inhale 2 puffs into the lungs every 6 (six) hours as needed for wheezing or shortness of breath (rescue).  06/20/14  Yes [provider]  allopurinol (ZYLOPRIM) 100 MG tablet Take 100 mg by mouth as needed (for gout and kidney stones).   Yes [provider]  aspirin EC 81 MG EC tablet Take 1 tablet (81 mg total) by mouth daily. 03/20/17  Yes Rodolph Bong, MD  ferrous sulfate 325 (65 FE) MG tablet Take 325 mg by mouth 3 (three) times daily with meals.   Yes [provider]  fluticasone (FLONASE) 50 MCG/ACT nasal spray Place 1 spray into both nostrils as needed for allergies. 06/07/22 06/07/23 Yes [provider]  fluticasone-salmeterol (ADVAIR) 500-50 MCG/ACT AEPB Inhale 1 puff into the lungs 2 (two) times daily.   Yes [provider]  furosemide (LASIX) 40 MG tablet Take 1 tablet (40 mg total) by mouth daily. May take one additional tablet as needed for shortness of breath or lower extremity edema. 08/25/22  Yes Conte, Tessa N, PA-C  levothyroxine (SYNTHROID) 50 MCG tablet Take 50 mcg by mouth daily. 02/26/19  Yes [provider]  potassium chloride SA (KLOR-CON M) 20 MEQ tablet Take 1 tablet (20 mEq total) by mouth 2 (two) times daily. 08/09/21  Yes Jake Bathe, MD  rosuvastatin (CRESTOR) 5 MG tablet Take 1 tablet (5  mg total) by mouth daily. 08/25/22  Yes Asa Lente, Tessa N, PA-C  colchicine 0.6 MG tablet Take 0.6 mg by mouth as needed (for gout attack).    [provider]  diclofenac sodium (VOLTAREN) 1 % GEL Apply 2 g topically 2 (two) times daily as needed (pain). Applies to knees. 06/13/14   [provider]  montelukast (SINGULAIR) 10 MG tablet Take 10 mg by mouth at bedtime as needed (allergies.).     [provider]  Multiple Vitamin (MULTIVITAMIN) capsule Take 1 capsule by mouth daily.    [provider]  Omega-3 Fatty Acids (FISH OIL  PO) Take 1 capsule by mouth daily.    [provider]  OZEMPIC, 0.25 OR 0.5 MG/DOSE, 2 MG/3ML SOPN Inject 0.5 mg into the skin once a week.    [provider]    Scheduled Meds: Continuous Infusions:  lactated ringers     PRN Meds:.  Allergies as of 01/05/2023 - Review Complete 01/05/2023  Allergen Reaction Noted   Sulfa antibiotics Hives and Shortness Of Breath 12/08/2011   Farxiga [dapagliflozin]  10/16/2017   Levothyroxine sodium  10/05/2017   Lipitor [atorvastatin]  06/01/2022   Metformin hcl  10/05/2017   Sulfamethoxazole-trimethoprim  10/05/2017    Family History  Problem Relation Age of Onset   Heart disease Mother    Heart failure Mother        Around 18   Hypertension Brother    Hypertension Maternal Grandmother    Heart disease Maternal Grandmother    Breast cancer Maternal Aunt     Social History   Socioeconomic History   Marital status: Single    Spouse name: Not on file   Number of children: Not on file   Years of education: Not on file   Highest education level: Not on file  Occupational History   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  Tobacco Use   Smoking status: Never   Smokeless tobacco: Never  Vaping Use   Vaping Use: Never used  Substance and Sexual Activity   Alcohol use: No   Drug use: No   Sexual activity: Not Currently    Birth control/protection: None  Other Topics Concern   Not on file  Social History Narrative   Not on file   Social Determinants of Health   Financial Resource Strain: Not on file  Food Insecurity: Not on file  Transportation Needs: Not on file  Physical Activity: Not on file  Stress: Not on file  Social Connections: Not on file  Intimate Partner Violence: Not on file    Review of Systems: All negative except as stated above in HPI.  Physical Exam: Vital signs: Vitals:   01/10/23 0923  BP: (!) 143/74  Pulse: 73  Resp: (!) 22  Temp: 97.9 F (36.6 C)  SpO2: 92%      General:   Morbidly obese, not in acute distress Lungs:  Clear throughout to auscultation.   No wheezes, crackles, or rhonchi. No acute distress. Heart:  Regular rate and rhythm; no murmurs, clicks, rubs,  or gallops. Abdomen: Soft, nontender, nondistended, bowel sounds present, no peritoneal signs Rectal:  Deferred  GI:  Lab Results: No results for input(s): "WBC", "HGB", "HCT", "PLT" in the last 72 hours. BMET No results for input(s): "NA", "K", "CL", "CO2", "GLUCOSE", "BUN", "CREATININE", "CALCIUM" in the last 72 hours. LFT No results for input(s): "PROT", "ALBUMIN", "AST", "ALT", "ALKPHOS", "BILITOT", "BILIDIR", "IBILI" in the last 72 hours. PT/INR No  results for input(s): "LABPROT", "INR" in the last 72 hours.   Studies/Results: No results found.  Impression/Plan: -GERD -History of colon polyps -Morbid obesity  Recommendations --------------------------- -Proceed with EGD and colonoscopy.  Risks (bleeding, infection, bowel perforation that could require surgery, sedation-related changes in cardiopulmonary systems), benefits (identification and possible treatment of source of symptoms, exclusion of certain causes of symptoms), and alternatives (watchful waiting, radiographic imaging studies, empiric medical treatment)  were explained to patient/family in detail and patient wishes to proceed.     LOS: 0 days   Kathi Der  MD, FACP 01/10/2023, 9:34 AM  Contact #  [phone]

## 2023-01-10 NOTE — Discharge Instructions (Signed)

## 2023-01-10 NOTE — Transfer of Care (Signed)
Immediate Anesthesia Transfer of Care Note  Patient: Cicily Macina  Procedure(s) Performed: COLONOSCOPY WITH PROPOFOL ESOPHAGOGASTRODUODENOSCOPY (EGD) WITH PROPOFOL BIOPSY POLYPECTOMY  Patient Location: PACU and Endoscopy Unit  Anesthesia Type:MAC  Level of Consciousness: awake, alert , and oriented  Airway & Oxygen Therapy: Patient Spontanous Breathing and Patient connected to face mask oxygen  Post-op Assessment: Report given to RN and Post -op Vital signs reviewed and stable  Post vital signs: Reviewed and stable  Last Vitals:  Vitals Value Taken Time  BP    Temp    Pulse 70 01/10/23 1141  Resp 26 01/10/23 1141  SpO2 96 % 01/10/23 1141  Vitals shown include unvalidated device data.  Last Pain:  Vitals:   01/10/23 0923  TempSrc: Temporal  PainSc: 0-No pain         Complications: No notable events documented.

## 2023-01-10 NOTE — Anesthesia Preprocedure Evaluation (Addendum)
Anesthesia Evaluation  Patient identified by MRN, date of birth, ID band Patient awake    Reviewed: Allergy & Precautions, NPO status , Patient's Chart, lab work & pertinent test results  History of Anesthesia Complications Negative for: history of anesthetic complications  Airway Mallampati: II  TM Distance: >3 FB Neck ROM: Full    Dental  (+) Dental Advisory Given   Pulmonary asthma , sleep apnea and Continuous Positive Airway Pressure Ventilation , COPD,  COPD inhaler   breath sounds clear to auscultation       Cardiovascular hypertension, Pt. on medications (-) angina  Rhythm:Regular Rate:Normal  40981 ECHO:  EF 60-65%. Normal LVF, Grade 2 DD, normal RVF, trivial MR   Neuro/Psych   Anxiety     negative neurological ROS     GI/Hepatic negative GI ROS, Neg liver ROS,,,  Endo/Other  diabetes (glu 129)Hypothyroidism  Morbid obesityOzempic: last dose 9d ago BMI 55  Renal/GU negative Renal ROS     Musculoskeletal   Abdominal  (+) + obese  Peds  Hematology negative hematology ROS (+)   Anesthesia Other Findings   Reproductive/Obstetrics                             Anesthesia Physical Anesthesia Plan  ASA: 3  Anesthesia Plan: MAC   Post-op Pain Management: Minimal or no pain anticipated   Induction:   PONV Risk Score and Plan: 2 and Treatment may vary due to age or medical condition  Airway Management Planned: Natural Airway and Nasal Cannula  Additional Equipment: None  Intra-op Plan:   Post-operative Plan:   Informed Consent: I have reviewed the patients History and Physical, chart, labs and discussed the procedure including the risks, benefits and alternatives for the proposed anesthesia with the patient or authorized representative who has indicated his/her understanding and acceptance.     Dental advisory given  Plan Discussed with: CRNA and Surgeon  Anesthesia Plan  Comments:         Anesthesia Quick Evaluation

## 2023-01-11 LAB — SURGICAL PATHOLOGY

## 2023-01-12 ENCOUNTER — Encounter (HOSPITAL_COMMUNITY): Payer: Self-pay | Admitting: Gastroenterology

## 2023-01-29 ENCOUNTER — Encounter (HOSPITAL_COMMUNITY): Payer: Self-pay

## 2023-01-29 ENCOUNTER — Ambulatory Visit (HOSPITAL_COMMUNITY)
Admission: EM | Admit: 2023-01-29 | Discharge: 2023-01-29 | Disposition: A | Discharge status: Home / Self Care | Payer: Medicare Other | Attending: Emergency Medicine | Admitting: Emergency Medicine

## 2023-01-29 DIAGNOSIS — M109 Gout, unspecified: Secondary | ICD-10-CM | POA: Insufficient documentation

## 2023-01-29 LAB — URIC ACID: Uric Acid, Serum: 7.7 mg/dL — ABNORMAL HIGH (ref 2.5–7.1)

## 2023-01-29 MED ORDER — DEXAMETHASONE SODIUM PHOSPHATE 10 MG/ML IJ SOLN
INTRAMUSCULAR | Status: AC
  Filled 2023-01-29: qty 1

## 2023-01-29 MED ORDER — DEXAMETHASONE SODIUM PHOSPHATE 10 MG/ML IJ SOLN
10.0000 mg | Freq: Once | INTRAMUSCULAR | Status: AC
  Administered 2023-01-29: 10 mg via INTRAMUSCULAR

## 2023-01-29 NOTE — ED Triage Notes (Signed)
Pt reports she is having a flare up/ pain in her right hand.   Denies injury.  Pts hand is swollen and the pain is  radiating up her right arm.   Provider told her to stop her colchicine for a procedure and the after care

## 2023-01-29 NOTE — ED Provider Notes (Signed)
[facility]    CSN: [id number] Arrival date & time: 01/29/23  1114      History   Chief Complaint No chief complaint on file.   HPI Theresa Kirby is a 67 y.o. female.   Patient presents to clinic for right hand pain and swelling since Wednesday.  Reports this is a typical location for a gout flare for her.  She had recently stopped her colchicine per recommendation of the pharmacist as she was taking fluconazole for an infection in her esophagus, Candida esophagitis, recently diagnosed from an EGD.  Reports she has been taking her allopurinol since Friday for her acute flare.  Reports she takes her colchicine regularly for prevention.  Denies any falls or trauma, wounds, streaking or fevers.  Patient is a diabetic, reports she has managed with Ozempic.    The history is provided by the patient and medical records.    Past Medical History:  Diagnosis Date   Arthritis    knees   Asthma    Iron deficiency anemia    Morbid obesity (HCC)    Rhabdomyolysis 04/2016   normal compartment pressures   Sleep apnea    uses cpap, setting is unknown, does not wear it consistently   Thyroid disease     Patient Active Problem List   Diagnosis Date Noted   Acute idiopathic gout of right hand 01/20/2020   Incidental pulmonary nodule 06/08/2017   Mediastinal lymphadenopathy 06/08/2017   Euthyroid sick syndrome    Elevated blood pressure reading without diagnosis of hypertension 03/14/2017   CHF exacerbation (HCC) 03/13/2017   Hypothyroidism 03/13/2017   OSA (obstructive sleep apnea) 03/13/2017   Anxiety 02/08/2012   Morbid obesity (HCC) 02/08/2012    Past Surgical History:  Procedure Laterality Date   BIOPSY  01/10/2023   Procedure: BIOPSY;  Surgeon: Kathi Der, MD;  Location: [facility];  Service: Gastroenterology;;   CESAREAN SECTION     COLONOSCOPY WITH PROPOFOL N/A 07/21/2015   Procedure: COLONOSCOPY WITH PROPOFOL;  Surgeon: Charna Elizabeth, MD;   Location: [facility];  Service: Endoscopy;  Laterality: N/A;   COLONOSCOPY WITH PROPOFOL N/A 01/10/2023   Procedure: COLONOSCOPY WITH PROPOFOL;  Surgeon: Kathi Der, MD;  Location: [facility];  Service: Gastroenterology;  Laterality: N/A;   DILATION AND CURETTAGE OF UTERUS     ENDOMETRIAL ABLATION     ESOPHAGOGASTRODUODENOSCOPY (EGD) WITH PROPOFOL N/A 01/10/2023   Procedure: ESOPHAGOGASTRODUODENOSCOPY (EGD) WITH PROPOFOL;  Surgeon: Kathi Der, MD;  Location: [facility];  Service: Gastroenterology;  Laterality: N/A;   GANGLION CYST EXCISION     rt arm   POLYPECTOMY  01/10/2023   Procedure: POLYPECTOMY;  Surgeon: Kathi Der, MD;  Location: [facility];  Service: Gastroenterology;;   SHOULDER SURGERY Left    Rotator cuff repair    OB History   No obstetric history on file.      Home Medications    Prior to Admission medications   Medication Sig Start Date End Date Taking? Authorizing Provider  acetaminophen (TYLENOL) 325 MG tablet Take 650 mg by mouth every 6 (six) hours as needed (pain).     [provider]  albuterol (VENTOLIN HFA) 108 (90 Base) MCG/ACT inhaler Inhale 2 puffs into the lungs every 6 (six) hours as needed for wheezing or shortness of breath (rescue).  06/20/14   [provider]  allopurinol (ZYLOPRIM) 100 MG tablet Take 100 mg by mouth as needed (for gout and kidney stones).    [provider]  aspirin EC  81 MG EC tablet Take 1 tablet (81 mg total) by mouth daily. 03/20/17   Rodolph Bong, MD  colchicine 0.6 MG tablet Take 0.6 mg by mouth as needed (for gout attack).    [provider]  diclofenac sodium (VOLTAREN) 1 % GEL Apply 2 g topically 2 (two) times daily as needed (pain). Applies to knees. 06/13/14   [provider]  ferrous sulfate 325 (65 FE) MG tablet Take 325 mg by mouth 3 (three) times daily with meals.    [provider]  fluticasone (FLONASE) 50 MCG/ACT nasal spray Place 1  spray into both nostrils as needed for allergies. 06/07/22 06/07/23  [provider]  fluticasone-salmeterol (ADVAIR) 500-50 MCG/ACT AEPB Inhale 1 puff into the lungs 2 (two) times daily.    [provider]  furosemide (LASIX) 40 MG tablet Take 1 tablet (40 mg total) by mouth daily. May take one additional tablet as needed for shortness of breath or lower extremity edema. 08/25/22   Sharlene Dory, PA-C  levothyroxine (SYNTHROID) 50 MCG tablet Take 50 mcg by mouth daily. 02/26/19   [provider]  montelukast (SINGULAIR) 10 MG tablet Take 10 mg by mouth at bedtime as needed (allergies.).     [provider]  Multiple Vitamin (MULTIVITAMIN) capsule Take 1 capsule by mouth daily.    [provider]  Omega-3 Fatty Acids (FISH OIL PO) Take 1 capsule by mouth daily.    [provider]  OZEMPIC, 0.25 OR 0.5 MG/DOSE, 2 MG/3ML SOPN Inject 0.5 mg into the skin once a week.    [provider]  potassium chloride SA (KLOR-CON M) 20 MEQ tablet Take 1 tablet (20 mEq total) by mouth 2 (two) times daily. 08/09/21   Jake Bathe, MD  rosuvastatin (CRESTOR) 5 MG tablet Take 1 tablet (5 mg total) by mouth daily. 08/25/22   Sharlene Dory, PA-C    Family History Family History  Problem Relation Age of Onset   Heart disease Mother    Heart failure Mother        Around 24   Hypertension Brother    Hypertension Maternal Grandmother    Heart disease Maternal Grandmother    Breast cancer Maternal Aunt     Social History Social History   Tobacco Use   Smoking status: Never   Smokeless tobacco: Never  Vaping Use   Vaping Use: Never used  Substance Use Topics   Alcohol use: No   Drug use: No     Allergies   Sulfa antibiotics, Farxiga [dapagliflozin], Levothyroxine sodium, Lipitor [atorvastatin], Metformin hcl, and Sulfamethoxazole-trimethoprim   Review of Systems Review of Systems  Constitutional:  Negative for fever.  Musculoskeletal:   Positive for joint swelling.     Physical Exam Triage Vital Signs ED Triage Vitals [01/29/23 1151]  Enc Vitals Group     BP (!) 182/65     Pulse Rate 75     Resp 20     Temp (!) 97.5 F (36.4 C)     Temp Source Oral     SpO2 96 %     Weight      Height      Head Circumference      Peak Flow      Pain Score 8     Pain Loc      Pain Edu?      Excl. in GC?    No data found.  Updated Vital Signs BP (!) 182/65 (BP  Location: Left Arm)   Pulse 75   Temp (!) 97.5 F (36.4 C) (Oral)   Resp 20   LMP 05/12/2012   SpO2 96%   Visual Acuity Right Eye Distance:   Left Eye Distance:   Bilateral Distance:    Right Eye Near:   Left Eye Near:    Bilateral Near:     Physical Exam Vitals and nursing note reviewed.  Constitutional:      Appearance: Normal appearance.  HENT:     Head: Normocephalic and atraumatic.     Right Ear: External ear normal.     Left Ear: External ear normal.     Nose: Nose normal.     Mouth/Throat:     Mouth: Mucous membranes are moist.  Eyes:     Conjunctiva/sclera: Conjunctivae normal.  Pulmonary:     Effort: Pulmonary effort is normal. No respiratory distress.  Musculoskeletal:        General: Swelling and tenderness present. Normal range of motion.     Right hand: Swelling present. Normal sensation. There is no disruption of two-point discrimination. Normal capillary refill. Normal pulse.     Cervical back: Normal range of motion.  Skin:    General: Skin is warm and dry.     Capillary Refill: Capillary refill takes less than 2 seconds.  Neurological:     General: No focal deficit present.     Mental Status: [name] and oriented to person, place, and time.  Psychiatric:        Mood and Affect: Mood normal.        Behavior: Behavior is cooperative.      UC Treatments / Results  Labs (all labs ordered are listed, but only abnormal results are displayed) Labs Reviewed  URIC ACID    EKG   Radiology No results  found.  Procedures Procedures (including critical care time)  Medications Ordered in UC Medications  dexamethasone (DECADRON) injection 10 mg (has no administration in time range)    Initial Impression / Assessment and Plan / UC Course  I have reviewed the triage vital signs and the nursing notes.  Pertinent labs & imaging results that were available during my care of the patient were reviewed by me and considered in my medical decision making (see chart for details).  Vitals and triage reviewed, patient is hemodynamically stable.  Presents to clinic with pain, swelling, warmth and tenderness to right middle finger and knuckle area. Atraumatic.   Symptoms consistent with previous gout flares.  Patient has stopped her colchicine due to recent fluconazole.  Patient has since finished the fluconazole.  Started taking allopurinol for her gout flare.  Discussed allopurinol for daily prevention colchicine for acute flares.  Advised to restart colchicine and stop allopurinol until flare has passed.  Uric acid level drawn.  Due to extent of pain and swelling, given one-time dose of IM steroid in clinic.  Encouraged to follow-up with PCP.  Plan of care, follow-up care and return precautions given, no questions at this time.    Final Clinical Impressions(s) / UC Diagnoses   Final diagnoses:  Acute gout of right hand, unspecified cause     Discharge Instructions      Your symptoms are consistent with a gout flare.  We are checking your uric acid levels to confirm this.  Allopurinol is a routine medication used daily to help prevent gout flareups.  Colchicine can be used during an acute flare, please start taking the 0.6 mg tablet  daily for your acute flareup.  I recommend discontinuing your allopurinol until you are out of your flareup, as this can prolong the amount of time you have your gout flare.  Please follow-up with your primary care provider regarding further clarification of your  medications and gout management.  Return to clinic for any new or urgent symptoms.      ED Prescriptions   None    PDMP not reviewed this encounter.   Nikitta Sobiech, [place], [place] 01/29/23 [phone]

## 2023-01-29 NOTE — Discharge Instructions (Addendum)
Your symptoms are consistent with a gout flare.  We are checking your uric acid levels to confirm this.  Allopurinol is a routine medication used daily to help prevent gout flareups.  Colchicine can be used during an acute flare, please start taking the 0.6 mg tablet daily for your acute flareup.  I recommend discontinuing your allopurinol until you are out of your flareup, as this can prolong the amount of time you have your gout flare.  Please follow-up with your primary care provider regarding further clarification of your medications and gout management.  Return to clinic for any new or urgent symptoms.

## 2023-02-06 ENCOUNTER — Telehealth (HOSPITAL_COMMUNITY): Payer: Self-pay | Admitting: Emergency Medicine

## 2023-02-06 NOTE — Telephone Encounter (Signed)
Patient left voicemail of the weekend that he hand was still swollen and painful since visit. Called to encourage return visit, patient states it is now improving

## 2023-02-10 ENCOUNTER — Other Ambulatory Visit: Payer: Self-pay | Admitting: Physician Assistant

## 2023-02-10 DIAGNOSIS — Z1231 Encounter for screening mammogram for malignant neoplasm of breast: Secondary | ICD-10-CM

## 2023-02-15 ENCOUNTER — Ambulatory Visit
Admission: RE | Admit: 2023-02-15 | Discharge: 2023-02-15 | Disposition: A | Discharge status: Home / Self Care | Payer: Medicare Other | Source: Ambulatory Visit | Attending: Physician Assistant | Admitting: Physician Assistant

## 2023-02-15 DIAGNOSIS — Z1231 Encounter for screening mammogram for malignant neoplasm of breast: Secondary | ICD-10-CM

## 2023-03-01 ENCOUNTER — Ambulatory Visit: Payer: Medicare Other | Admitting: Sports Medicine

## 2023-03-01 VITALS — BP 127/64 | Ht 64.0 in | Wt 320.0 lb

## 2023-03-01 DIAGNOSIS — M109 Gout, unspecified: Secondary | ICD-10-CM | POA: Diagnosis not present

## 2023-03-01 NOTE — Patient Instructions (Signed)
Take colchicine twice a day for 2 weeks After 2 weeks if you are better you can go back to once a day. Use topical voltaren over that area of the hand 3 times a day. Continue the allopurinol  Follow up with [place] if needed.

## 2023-03-01 NOTE — Progress Notes (Signed)
Chief complaint right hand swelling and pain  Patient had an endoscopy for some esophageal reflux symptoms.  She was taken off her colchicine for about 2 weeks.  She has a past history of gout and takes allopurinol and one 0.6 colchicine pill daily to prevent attacks.  After 2 weeks off the colchicine she had significant swelling in her right hand along the MCP joints and pain. She has since started back on the colchicine 0.61 time a day with some improvement and does continue her allopurinol. She comes today because of the persistence and wants to know if she can do anything to speed recovery.  Physical exam Pleasant obese black female who is in no acute distress BP 127/64   Ht 5\' 4"  (1.626 m)   Wt (!) 320 lb (145.2 kg)   LMP 05/12/2012   BMI 54.93 kg/m   Right hand shows swelling over MCPs 2 3 and 4 None is noted on the left hand This area is only slightly warm and not reddened She has full grip with her hand  More swelling is located over the third MCP RT  Screening ultrasound of her right hand reveals minimal fluid and no significant crystals at MCPs 2 and 4. On MCP3 she has some calcifications and some arthritic spurring along with an effusion

## 2023-03-01 NOTE — Assessment & Plan Note (Signed)
I discussed with her today that I believe we need to go back on the colchicine 0.6 mg twice daily To be sure that this does not aggravate her recent symptoms of esophagitis Stay at this level for 2 weeks and if the hand is improved she can go back to once a day Continue her allopurinol as before Add topical Voltaren 3 times daily over the hand until the swelling goes down Recheck with me in 1 month or with her primary as needed

## 2023-03-25 ENCOUNTER — Other Ambulatory Visit: Payer: Self-pay | Admitting: Cardiology

## 2023-03-31 ENCOUNTER — Other Ambulatory Visit: Payer: Self-pay

## 2023-03-31 MED ORDER — POTASSIUM CHLORIDE CRYS ER 20 MEQ PO TBCR: 20.0000 meq | EXTENDED_RELEASE_TABLET | Freq: Two times a day (BID) | ORAL | 3 refills | Status: DC

## 2023-05-29 ENCOUNTER — Encounter: Payer: Self-pay | Admitting: Cardiology

## 2023-05-29 ENCOUNTER — Ambulatory Visit: Payer: Medicare Other | Attending: Cardiology | Admitting: Cardiology

## 2023-05-29 VITALS — BP 140/82 | HR 74 | Ht 64.0 in | Wt 315.4 lb

## 2023-05-29 DIAGNOSIS — I5032 Chronic diastolic (congestive) heart failure: Secondary | ICD-10-CM

## 2023-05-29 DIAGNOSIS — Z09 Encounter for follow-up examination after completed treatment for conditions other than malignant neoplasm: Secondary | ICD-10-CM

## 2023-05-29 DIAGNOSIS — G4733 Obstructive sleep apnea (adult) (pediatric): Secondary | ICD-10-CM

## 2023-05-29 DIAGNOSIS — E118 Type 2 diabetes mellitus with unspecified complications: Secondary | ICD-10-CM

## 2023-05-29 NOTE — Progress Notes (Signed)
Cardiology Office Note:  .   Date:  05/29/2023  ID:  Silver Huguenin, DOB Jul 01, 1956, MRN [id number] PCP: Laruth Bouchard, MD (Inactive)  [facility] Providers Cardiologist:  Donato Schultz, MD     History of Present Illness: .   Theresa Kirby is a 67 y.o. female Discussed with the use of AI scribe software  History of Present Illness   The patient, a 67 year old with a history of chronic diastolic heart failure, morbid obesity, obstructive sleep apnea managed with CPAP, and hyperlipidemia, presents for a six-month follow-up. She reports an improvement in her breathing and a variable degree of swelling, which she notes is dependent on her activity level. She has been making efforts to improve her diet, exploring new recipes and focusing on healthier alternatives. However, she acknowledges the need for increased physical activity.  The patient has been experiencing stomach trouble, which she describes as irritation, and has an upcoming appointment for further evaluation. She notes that this discomfort does not appear to be cardiac in nature. She also mentions a possible correlation between her stomach issues and her overall well-being, stating that when her stomach is not okay, she does not feel okay. She has been considering whether her CPAP or Ozempic medication could be contributing to this irritation.  Despite these challenges, the patient expresses a determination to improve her health and well-being. She expresses frustration at the difficulty of losing weight, but remains committed to her efforts.          ROS: No CP  Studies Reviewed: Marland Kitchen   EKG Interpretation Date/Time:  Monday May 29 2023 10:05:22 EST Ventricular Rate:  74 PR Interval:  166 QRS Duration:  86 QT Interval:  416 QTC Calculation: 461 R Axis:   82  Text Interpretation: Normal sinus rhythm Low voltage QRS When compared with ECG of 14-Mar-2017 06:00, T wave inversion no longer evident in Inferior leads  T wave inversion no longer evident in Anterior leads Confirmed by Donato Schultz ([id number]) on 05/29/2023 10:17:54 AM    Results LABS LDL cholesterol: 104 mg/dL Creatinine: 0.8 mg/dL Hemoglobin: 10.1 g/dL  DIAGNOSTIC EKG: Normal  Risk Assessment/Calculations:           Physical Exam:   VS:  BP (!) 140/82   Pulse 74   Ht 5\' 4"  (1.626 m)   Wt (!) 315 lb 6.4 oz (143.1 kg)   LMP 05/12/2012   SpO2 91%   BMI 54.14 kg/m    Wt Readings from Last 3 Encounters:  05/29/23 (!) 315 lb 6.4 oz (143.1 kg)  03/01/23 (!) 320 lb (145.2 kg)  01/10/23 (!) 320 lb (145.2 kg)    GEN: Well nourished, well developed in no acute distress NECK: No JVD; No carotid bruits CARDIAC: RRR, no murmurs, no rubs, no gallops RESPIRATORY:  Clear to auscultation without rales, wheezing or rhonchi  ABDOMEN: Soft, non-tender, non-distended EXTREMITIES:  No edema; No deformity   ASSESSMENT AND PLAN: .    Assessment and Plan    Chronic Diastolic Heart Failure Stable with some improvement in dyspnea. No changes in medication regimen. -Continue Aspirin 81mg  daily, Furosemide 40mg  daily (extra as needed).  Morbid Obesity Patient is making efforts to improve diet and increase physical activity. -Encourage continued dietary changes and gradual increase in physical activity. -Continue Ozempic, monitor for potential GI side effects.  Hyperlipidemia LDL 104 on Crestor 5mg  daily. -Continue Crestor 5mg  daily.  Obstructive Sleep Apnea No specific complaints related to OSA or CPAP use. -Continue CPAP  as prescribed.  Gastrointestinal Irritation Patient reports recent GI symptoms and has upcoming appointment for evaluation. -Continue current medications, reevaluate if changes are made at GI appointment.  Follow-up in 1 year.               Signed, Donato Schultz, MD

## 2023-05-29 NOTE — Patient Instructions (Signed)

## 2023-06-02 ENCOUNTER — Encounter (HOSPITAL_COMMUNITY): Payer: Self-pay

## 2023-06-02 ENCOUNTER — Ambulatory Visit (HOSPITAL_COMMUNITY)
Admission: EM | Admit: 2023-06-02 | Discharge: 2023-06-02 | Disposition: A | Discharge status: Home / Self Care | Payer: Medicare Other | Attending: Internal Medicine | Admitting: Internal Medicine

## 2023-06-02 DIAGNOSIS — L02412 Cutaneous abscess of left axilla: Secondary | ICD-10-CM | POA: Diagnosis present

## 2023-06-02 DIAGNOSIS — J45909 Unspecified asthma, uncomplicated: Secondary | ICD-10-CM | POA: Insufficient documentation

## 2023-06-02 DIAGNOSIS — I5030 Unspecified diastolic (congestive) heart failure: Secondary | ICD-10-CM | POA: Insufficient documentation

## 2023-06-02 DIAGNOSIS — E119 Type 2 diabetes mellitus without complications: Secondary | ICD-10-CM | POA: Insufficient documentation

## 2023-06-02 DIAGNOSIS — E785 Hyperlipidemia, unspecified: Secondary | ICD-10-CM | POA: Insufficient documentation

## 2023-06-02 DIAGNOSIS — G4730 Sleep apnea, unspecified: Secondary | ICD-10-CM | POA: Insufficient documentation

## 2023-06-02 HISTORY — DX: Type 2 diabetes mellitus without complications: E11.9

## 2023-06-02 MED ORDER — DOXYCYCLINE HYCLATE 100 MG PO CAPS: 100.0000 mg | ORAL_CAPSULE | Freq: Two times a day (BID) | ORAL | 0 refills | Status: DC

## 2023-06-02 NOTE — ED Triage Notes (Signed)
"  I have a mole under my right arm and the other day if felt it and it had gotten so bit and when I pressed on it some blood come out and it has been sore ever since, very itchy now". No fever. "Painful to touch only".

## 2023-06-02 NOTE — ED Provider Notes (Signed)
[facility]    CSN: [id number] Arrival date & time: 06/02/23  1648      History   Chief Complaint Chief Complaint  Patient presents with   Skin Problem    HPI Theresa Kirby is a 67 y.o. female.   HPI Has a mole under her left arm which has been present as long as she can remember.  2 days ago she noted some local soreness now has redness and drainage.  Denies injury, fever, chills, sweats.  She is a type II diabetic.  Allergy to sulfa  Past Medical History:  Diagnosis Date   Arthritis    knees   Asthma    Iron deficiency anemia    Morbid obesity (HCC)    Rhabdomyolysis 04/2016   normal compartment pressures   Sleep apnea    uses cpap, setting is unknown, does not wear it consistently   Thyroid disease    Type 2 diabetes mellitus (HCC) 06/02/2023    Patient Active Problem List   Diagnosis Date Noted   Diastolic heart failure (HCC) 06/02/2023   Hyperlipidemia 06/02/2023   Type 2 diabetes mellitus (HCC) 06/02/2023   Asthma 06/02/2023   Sleep apnea with use of continuous positive airway pressure (CPAP) 06/02/2023   Body mass index (BMI) of 50.0 to 59.9 in adult [facility]) 04/05/2021   Gout 01/20/2020   Acute idiopathic gout of right hand 01/20/2020   Incidental pulmonary nodule 06/08/2017   Mediastinal lymphadenopathy 06/08/2017   Euthyroid sick syndrome    Elevated blood pressure reading without diagnosis of hypertension 03/14/2017   CHF exacerbation (HCC) 03/13/2017   Hypothyroidism 03/13/2017   OSA (obstructive sleep apnea) 03/13/2017   Anxiety 02/08/2012   Morbid obesity (HCC) 02/08/2012    Past Surgical History:  Procedure Laterality Date   BIOPSY  01/10/2023   Procedure: BIOPSY;  Surgeon: Kathi Der, MD;  Location: [facility];  Service: Gastroenterology;;   CESAREAN SECTION     COLONOSCOPY WITH PROPOFOL N/A 07/21/2015   Procedure: COLONOSCOPY WITH PROPOFOL;  Surgeon: Charna Elizabeth, MD;  Location: [facility];  Service: Endoscopy;   Laterality: N/A;   COLONOSCOPY WITH PROPOFOL N/A 01/10/2023   Procedure: COLONOSCOPY WITH PROPOFOL;  Surgeon: Kathi Der, MD;  Location: [facility];  Service: Gastroenterology;  Laterality: N/A;   DILATION AND CURETTAGE OF UTERUS     ENDOMETRIAL ABLATION     ESOPHAGOGASTRODUODENOSCOPY (EGD) WITH PROPOFOL N/A 01/10/2023   Procedure: ESOPHAGOGASTRODUODENOSCOPY (EGD) WITH PROPOFOL;  Surgeon: Kathi Der, MD;  Location: [facility];  Service: Gastroenterology;  Laterality: N/A;   GANGLION CYST EXCISION     rt arm   POLYPECTOMY  01/10/2023   Procedure: POLYPECTOMY;  Surgeon: Kathi Der, MD;  [facility]: [facility];  Service: Gastroenterology;;   SHOULDER SURGERY Left    Rotator cuff repair    OB History   No obstetric history on file.      Home Medications    Prior to Admission medications   Medication Sig Start Date End Date Taking? Authorizing Provider  albuterol (VENTOLIN HFA) 108 (90 Base) MCG/ACT inhaler Inhale 2 puffs into the lungs every 6 (six) hours as needed for wheezing or shortness of breath (rescue).  06/20/14  Yes [provider]  aspirin EC 81 MG EC tablet Take 1 tablet (81 mg total) by mouth daily. 03/20/17  Yes Rodolph Bong, MD  ferrous sulfate 325 (65 FE) MG tablet Take 325 mg by mouth 3 (three) times daily with meals.   Yes [provider]  fluticasone (FLONASE) 50 MCG/ACT nasal spray Place 1 spray into both nostrils as needed for allergies. 06/07/22 06/07/23 Yes [provider]  fluticasone-salmeterol (ADVAIR) 500-50 MCG/ACT AEPB Inhale 1 puff into the lungs 2 (two) times daily.   Yes [provider]  furosemide (LASIX) 40 MG tablet Take 1 tablet (40 mg total) by mouth daily. May take one additional tablet as needed for shortness of breath or lower extremity edema. 08/25/22  Yes Conte, Tessa N, PA-C  levothyroxine (SYNTHROID) 50 MCG tablet Take 50 mcg by mouth daily. 02/26/19  Yes [provider]   montelukast (SINGULAIR) 10 MG tablet Take 10 mg by mouth at bedtime as needed (allergies.).    Yes [provider]  Multiple Vitamin (MULTIVITAMIN) capsule Take 1 capsule by mouth daily.   Yes [provider]  Omega-3 Fatty Acids (FISH OIL PO) Take 1 capsule by mouth daily.   Yes [provider]  OZEMPIC, 0.25 OR 0.5 MG/DOSE, 2 MG/3ML SOPN Inject 0.5 mg into the skin once a week.   Yes [provider]  potassium chloride SA (KLOR-CON M) 20 MEQ tablet Take 1 tablet (20 mEq total) by mouth 2 (two) times daily. 03/31/23  Yes Jake Bathe, MD  rosuvastatin (CRESTOR) 5 MG tablet Take 1 tablet (5 mg total) by mouth daily. 08/25/22  Yes Asa Lente, Tessa N, PA-C  acetaminophen (TYLENOL) 325 MG tablet Take 650 mg by mouth every 6 (six) hours as needed (pain).     [provider]  allopurinol (ZYLOPRIM) 100 MG tablet Take 100 mg by mouth as needed (for gout and kidney stones).    [provider]  colchicine 0.6 MG tablet Take 0.6 mg by mouth as needed (for gout attack).    [provider]  diclofenac sodium (VOLTAREN) 1 % GEL Apply 2 g topically 2 (two) times daily as needed (pain). Applies to knees. 06/13/14   [provider]    Family History Family History  Problem Relation Age of Onset   Heart disease Mother    Heart failure Mother        Around 29   Hypertension Brother    Hypertension Maternal Grandmother    Heart disease Maternal Grandmother    Breast cancer Maternal Aunt     Social History Social History   Tobacco Use   Smoking status: Never   Smokeless tobacco: Never  Vaping Use   Vaping status: Never Used  Substance Use Topics   Alcohol use: No   Drug use: No     Allergies   Sulfa antibiotics, Sulfamethoxazole-trimethoprim, Farxiga [dapagliflozin], Levothyroxine sodium, Lipitor [atorvastatin], and Metformin hcl   Review of Systems Review of Systems  Constitutional:  Negative for chills and fever.  Skin:   Positive for color change and wound.     Physical Exam Triage Vital Signs ED Triage Vitals  Encounter Vitals Group     BP 06/02/23 1854 (!) 160/77     Systolic BP Percentile --      Diastolic BP Percentile --      Pulse Rate 06/02/23 1854 71     Resp 06/02/23 1854 20     Temp 06/02/23 1854 98.2 F (36.8 C)     Temp Source 06/02/23 1854 Oral     SpO2 06/02/23 1854 96 %     Weight 06/02/23 1852 (!) 312 lb (141.5 kg)     Height 06/02/23 1852 5\' 4"  (1.626 m)     Head Circumference --  Peak Flow --      Pain Score 06/02/23 1849 0     Pain Loc --      Pain Education --      Exclude from Growth Chart --    No data found.  Updated Vital Signs BP (!) 165/75 (BP Location: Left Arm)   Pulse 71   Temp 98.2 F (36.8 C) (Oral)   Resp 20   Ht 5\' 4"  (1.626 m)   Wt (!) 312 lb (141.5 kg)   LMP 05/12/2012   SpO2 96%   BMI 53.55 kg/m   Visual Acuity Right Eye Distance:   Left Eye Distance:   Bilateral Distance:    Right Eye Near:   Left Eye Near:    Bilateral Near:     Physical Exam Vitals and nursing note reviewed.  Constitutional:      Appearance: She is obese.  Cardiovascular:     Rate and Rhythm: Normal rate.  Pulmonary:     Effort: Pulmonary effort is normal. No respiratory distress.  Skin:    Findings: Erythema and lesion (8 mm flesh-colored raised lesion left axilla with central open area there is local induration, erythema extending 2 cm from edges of lesion, purulent drainage with pressure) present.  Neurological:     Mental Status: [name].      UC Treatments / Results  Labs (all labs ordered are listed, but only abnormal results are displayed) Labs Reviewed  AEROBIC CULTURE W GRAM STAIN (SUPERFICIAL SPECIMEN)    EKG   Radiology No results found.  Procedures Procedures (including critical care time)  Medications Ordered in UC Medications - No data to display  Initial Impression / Assessment and Plan / UC Course  I have reviewed the  triage vital signs and the nursing notes.  Pertinent labs & imaging results that were available during my care of the patient were reviewed by me and considered in my medical decision making (see chart for details).     Infected mole left axilla, recommend she follow-up with a dermatologist, should see her PCP if unable to schedule with dermatologist.  recommend warm compresses will treat with doxycycline Final Clinical Impressions(s) / UC Diagnoses   Final diagnoses:  None   Discharge Instructions   None    ED Prescriptions   None    PDMP not reviewed this encounter.   Meliton Rattan, [place] 06/02/23 [phone]

## 2023-06-02 NOTE — Discharge Instructions (Addendum)
See your doctor on Monday, follow-up with a dermatologist to have the mole removed Apply frequent warm compresses to affected area

## 2023-06-05 LAB — AEROBIC CULTURE W GRAM STAIN (SUPERFICIAL SPECIMEN): Culture: NORMAL

## 2023-08-04 ENCOUNTER — Other Ambulatory Visit: Payer: Self-pay | Admitting: Physician Assistant

## 2023-08-07 ENCOUNTER — Other Ambulatory Visit: Payer: Self-pay

## 2023-08-07 MED ORDER — ROSUVASTATIN CALCIUM 5 MG PO TABS: 5.0000 mg | ORAL_TABLET | Freq: Every day | ORAL | 3 refills | Status: AC

## 2023-08-07 NOTE — Telephone Encounter (Signed)
 Already refilled 08/07/23

## 2023-11-20 ENCOUNTER — Other Ambulatory Visit: Payer: Self-pay | Admitting: Physician Assistant

## 2023-12-13 ENCOUNTER — Ambulatory Visit (INDEPENDENT_AMBULATORY_CARE_PROVIDER_SITE_OTHER)

## 2023-12-13 ENCOUNTER — Ambulatory Visit (HOSPITAL_COMMUNITY)
Admission: EM | Admit: 2023-12-13 | Discharge: 2023-12-13 | Disposition: A | Discharge status: Home / Self Care | Attending: Family Medicine | Admitting: Family Medicine

## 2023-12-13 ENCOUNTER — Encounter (HOSPITAL_COMMUNITY): Payer: Self-pay

## 2023-12-13 DIAGNOSIS — M79671 Pain in right foot: Secondary | ICD-10-CM | POA: Diagnosis not present

## 2023-12-13 MED ORDER — METHYLPREDNISOLONE 4 MG PO TBPK: ORAL_TABLET | ORAL | 0 refills | Status: DC

## 2023-12-13 NOTE — ED Triage Notes (Signed)
 Patient here today with c/o right foot pain upon waking 4 days ago. Patient has been having increased pain with weightbearing. Patient has some redness. Patient has a h/o gout. Patient states that she rubbed it down in alcohol which seemed to have helped. Patient took 1 Ibuprofen  with some relief.

## 2023-12-14 ENCOUNTER — Ambulatory Visit (HOSPITAL_COMMUNITY): Payer: Self-pay

## 2023-12-14 NOTE — ED Provider Notes (Signed)
 [facility]   [id number] 12/13/23 Arrival Time: 1623  ASSESSMENT & PLAN:  1. Foot pain, right    I have personally viewed and independently interpreted the imaging studies ordered this visit. R foot: no acute changes/fx appreciated; degenerative changes..  Trial of: Discharge Medication List as of 12/13/2023  7:12 PM     START taking these medications   Details  methylPREDNISolone  (MEDROL  DOSEPAK) 4 MG TBPK tablet Take as directed., Normal        Orders Placed This Encounter  Procedures   DG Foot Complete Right   Activities as tolerated.  Recommend:  Follow-up Information     Warrick Habermann, MD.   Specialty: Family Medicine Why: If worsening or failing to improve as anticipated. Contact information: [address] [place] [address] [phone]                 Reviewed expectations re: course of current medical issues. Questions answered. Outlined signs and symptoms indicating need for more acute intervention. Patient verbalized understanding. After Visit Summary given.  SUBJECTIVE: History from: patient. Theresa Kirby is a 68 y.o. female who reports right foot pain upon waking 4 days ago. Patient has been having increased pain with weightbearing. Patient has some redness. Patient has a h/o gout. Patient states that she rubbed it down in alcohol which seemed to have helped. Patient took 1 Ibuprofen  with some relief. Denies any foot trauma. No extremity sensation changes or weakness.    Past Surgical History:  Procedure Laterality Date   BIOPSY  01/10/2023   Procedure: BIOPSY;  Surgeon: Felecia Hopper, MD;  Location: [facility];  Service: Gastroenterology;;   CESAREAN SECTION     COLONOSCOPY WITH PROPOFOL  N/A 07/21/2015   Procedure: COLONOSCOPY WITH PROPOFOL ;  Surgeon: Tami Falcon, MD;  Location: [facility];  Service: Endoscopy;  Laterality: N/A;   COLONOSCOPY WITH PROPOFOL  N/A 01/10/2023   Procedure: COLONOSCOPY WITH  PROPOFOL ;  Surgeon: Felecia Hopper, MD;  Location: [facility];  Service: Gastroenterology;  Laterality: N/A;   DILATION AND CURETTAGE OF UTERUS     ENDOMETRIAL ABLATION     ESOPHAGOGASTRODUODENOSCOPY (EGD) WITH PROPOFOL  N/A 01/10/2023   Procedure: ESOPHAGOGASTRODUODENOSCOPY (EGD) WITH PROPOFOL ;  Surgeon: Felecia Hopper, MD;  [facility];  Service: Gastroenterology;  Laterality: N/A;   GANGLION CYST EXCISION     rt arm   POLYPECTOMY  01/10/2023   Procedure: POLYPECTOMY;  Surgeon: Felecia Hopper, MD;  [facility];  Service: Gastroenterology;;   SHOULDER SURGERY Left    Rotator cuff repair      OBJECTIVE:  Vitals:   12/13/23 1749  BP: (!) 145/75  Pulse: 66  Resp: 16  Temp: 98.3 F (36.8 C)  TempSrc: Oral  SpO2: 92%    General appearance: alert; no distress HEENT: Eugenio Saenz; AT Neck: supple with FROM Resp: unlabored respirations Extremities: RLE: warm with well perfused appearance; fairly well localized moderate tenderness over right dorsal foot; without gross deformities; swelling: minimal; bruising: none; ankle and all toes with ROM: normal CV: brisk extremity capillary refill of RLE; 2+ DP pulse of RLE. Skin: warm and dry; no visible rashes Neurologic: gait favors R foot with ambulation; normal sensation and strength of RLE Psychological: alert and cooperative; normal mood and affect  Imaging: DG Foot Complete Right Result Date: 12/13/2023 CLINICAL DATA:  Midfoot pain for 4 days. EXAM: RIGHT FOOT COMPLETE - 3+ VIEW COMPARISON:  None Available. FINDINGS: There is no acute fracture or dislocation identified. There are mild degenerative changes of the  first metatarsophalangeal joint with mild hallux valgus. There are mild degenerative changes of the dorsal midfoot. Plantar and posterior calcaneal spurs are present. There is some soft tissue swelling over the dorsum of the foot. IMPRESSION: 1. No acute fracture or dislocation. 2. Mild degenerative changes of  the first metatarsophalangeal joint and dorsal midfoot. 3. Soft tissue swelling over the dorsum of the foot. Electronically Signed   By: Tyron Gallon M.D.   On: 12/13/2023 19:23      Allergies  Allergen Reactions   Sulfa Antibiotics Hives and Shortness Of Breath   Sulfamethoxazole-Trimethoprim Hives and Shortness Of Breath    Other reaction(s): hives   Farxiga [Dapagliflozin]     Blisters   Levothyroxine  Sodium     Other reaction(s): Abd pain   Lipitor [Atorvastatin]     Muscle ache   Metformin  Hcl     Other reaction(s): Abd discomfort    Past Medical History:  Diagnosis Date   Arthritis    knees   Asthma    Iron deficiency anemia    Morbid obesity (HCC)    Rhabdomyolysis 04/2016   normal compartment pressures   Sleep apnea    uses cpap, setting is unknown, does not wear it consistently   Thyroid  disease    Type 2 diabetes mellitus (HCC) 06/02/2023   Social History   Socioeconomic History   Marital status: Single    Spouse name: Not on file   Number of children: Not on file   Years of education: Not on file   Highest education level: Not on file  Occupational History   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  Tobacco Use   Smoking status: Never   Smokeless tobacco: Never  Vaping Use   Vaping status: Never Used  Substance and Sexual Activity   Alcohol use: No   Drug use: No   Sexual activity: Not Currently    Birth control/protection: None  Other Topics Concern   Not on file  Social History Narrative   Not on file   Social Drivers of Health   Financial Resource Strain: Not on File (06/06/2022)   Received from [employer]    Financial Resource Strain: 0  Food Insecurity: Not at Risk (06/09/2023)   Received from [facility] Insecurity    Food: 1  Transportation Needs: Not at Risk (06/09/2023)   Received from [employer] Needs    Transportation: 1  Physical Activity: Not on File (06/06/2022)   Received  from [facility]   Physical Activity    Physical Activity: 0  Recent Concern: Physical Activity - At Risk (06/06/2022)   Received from [place], [place]   Physical Activity    Physical Activity: 2  Stress: Not on File (06/06/2022)   Received from [facility]   Stress    Stress: 0  Social Connections: Not on File (03/28/2023)   Received from [facility] [place]   Social Connections    Connectedness: 0   Family History  Problem Relation Age of Onset   Heart disease Mother    Heart failure Mother        Around 37   Hypertension Brother    Hypertension Maternal Grandmother    Heart disease Maternal Grandmother    Breast cancer Maternal Aunt    Past Surgical History:  Procedure Laterality Date   BIOPSY  01/10/2023   Procedure: BIOPSY;  Surgeon: Felecia Hopper, MD;  Location: [facility];  Service: Gastroenterology;;   CESAREAN  SECTION     COLONOSCOPY WITH PROPOFOL  N/A 07/21/2015   Procedure: COLONOSCOPY WITH PROPOFOL ;  Surgeon: Tami Falcon, MD;  Location: [facility];  Service: Endoscopy;  Laterality: N/A;   COLONOSCOPY WITH PROPOFOL  N/A 01/10/2023   Procedure: COLONOSCOPY WITH PROPOFOL ;  Surgeon: Felecia Hopper, MD;  Location: [facility];  Service: Gastroenterology;  Laterality: N/A;   DILATION AND CURETTAGE OF UTERUS     ENDOMETRIAL ABLATION     ESOPHAGOGASTRODUODENOSCOPY (EGD) WITH PROPOFOL  N/A 01/10/2023   Procedure: ESOPHAGOGASTRODUODENOSCOPY (EGD) WITH PROPOFOL ;  Surgeon: Felecia Hopper, MD;  [facility];  Service: Gastroenterology;  Laterality: N/A;   GANGLION CYST EXCISION     rt arm   POLYPECTOMY  01/10/2023   Procedure: POLYPECTOMY;  Surgeon: Felecia Hopper, MD;  [facility];  Service: Gastroenterology;;   SHOULDER SURGERY Left    Rotator cuff repair       Afton Albright, MD 12/14/23 1024

## 2024-02-01 ENCOUNTER — Ambulatory Visit: Admitting: Pulmonary Disease

## 2024-02-01 VITALS — BP 115/73 | HR 61 | Ht 64.0 in | Wt 300.0 lb

## 2024-02-01 DIAGNOSIS — G4733 Obstructive sleep apnea (adult) (pediatric): Secondary | ICD-10-CM

## 2024-02-01 DIAGNOSIS — E66813 Obesity, class 3: Secondary | ICD-10-CM

## 2024-02-01 DIAGNOSIS — I10 Essential (primary) hypertension: Secondary | ICD-10-CM

## 2024-02-01 DIAGNOSIS — Z6841 Body Mass Index (BMI) 40.0 and over, adult: Secondary | ICD-10-CM

## 2024-02-01 DIAGNOSIS — E785 Hyperlipidemia, unspecified: Secondary | ICD-10-CM | POA: Diagnosis not present

## 2024-02-01 DIAGNOSIS — Z8679 Personal history of other diseases of the circulatory system: Secondary | ICD-10-CM

## 2024-02-01 NOTE — Patient Instructions (Signed)
 I will see you in about 3 months  Continue using your CPAP  We will try and make contact with [facility] to get a copy of your download  Continue weight loss efforts  The last CPAP settings from Dr. Joesphine notes was 8-18  We may be able to adjust this downwards if it is waking you up frequently  We have to be able to confirm who to send prescriptions to to effect the changes

## 2024-02-01 NOTE — Progress Notes (Signed)
 Theresa Kirby    [id number]    [birth year]  Primary Care Physician:Lott, Pamila, MD (Inactive)  Referring Physician: Buck Search, PA-C [address] [place],  [place] [address]  Chief complaint:   Patient with moderate obstructive sleep apnea  HPI:  Was previously following up with Dr. Burnard  Last office visit was in 2022, on AutoSet 8-18 with excellent compliance  She uses CPAP nightly Wakes up at least a couple of times, feels this is related to CPAP pressures usually goes to bed between 11 and 12, falls asleep in 5 to 10 minutes Final wake up time about 8 AM She does some meditation before finally getting herself going  Current machine is 73 to 67 years old  DME she recollects his choice medical  She wakes up with a dry mouth despite replenishing her water every night No morning headaches She does have some night sweats  History of heart failure, asthma, diabetes, hypercholesterolemia  Non-smoker  No family history of sleep apnea  Outpatient Encounter Medications as of 02/01/2024  Medication Sig   acetaminophen  (TYLENOL ) 325 MG tablet Take 650 mg by mouth every 6 (six) hours as needed (pain).    albuterol  (VENTOLIN  HFA) 108 (90 Base) MCG/ACT inhaler Inhale 2 puffs into the lungs every 6 (six) hours as needed for wheezing or shortness of breath (rescue).    aspirin  EC 81 MG EC tablet Take 1 tablet (81 mg total) by mouth daily.   colchicine  0.6 MG tablet Take 0.6 mg by mouth as needed (for gout attack).   diclofenac sodium (VOLTAREN) 1 % GEL Apply 2 g topically 2 (two) times daily as needed (pain). Applies to knees.   ferrous sulfate  325 (65 FE) MG tablet Take 325 mg by mouth 3 (three) times daily with meals.   fluticasone  (FLONASE) 50 MCG/ACT nasal spray Place 1 spray into both nostrils as needed for allergies.   fluticasone -salmeterol (ADVAIR ) 500-50 MCG/ACT AEPB Inhale 1 puff into the lungs 2 (two) times daily.   furosemide  (LASIX ) 40 MG tablet TAKE  1 TABLET BY MOUTH ONCE DAILY AND MAY TAKE  1 ADDITIONAL TABLET AS NEEDED FOR SHORTNESS OF BREATH OR LOWER EXTREMITY EDEMA   levothyroxine  (SYNTHROID ) 50 MCG tablet Take 50 mcg by mouth daily.   methylPREDNISolone  (MEDROL  DOSEPAK) 4 MG TBPK tablet Take as directed.   montelukast  (SINGULAIR ) 10 MG tablet Take 10 mg by mouth at bedtime as needed (allergies.).    Multiple Vitamin (MULTIVITAMIN) capsule Take 1 capsule by mouth daily.   Omega-3 Fatty Acids (FISH OIL PO) Take 1 capsule by mouth daily.   OZEMPIC, 0.25 OR 0.5 MG/DOSE, 2 MG/3ML SOPN Inject 0.5 mg into the skin once a week.   potassium chloride  SA (KLOR-CON  M) 20 MEQ tablet Take 1 tablet (20 mEq total) by mouth 2 (two) times daily.   rosuvastatin  (CRESTOR ) 5 MG tablet Take 1 tablet (5 mg total) by mouth daily.   triamcinolone lotion (KENALOG) 0.1 % Apply 1 Application topically daily.   No facility-administered encounter medications on file as of 02/01/2024.    Allergies as of 02/01/2024 - Review Complete 02/01/2024  Allergen Reaction Noted   Sulfa antibiotics Hives and Shortness Of Breath 12/08/2011   Sulfamethoxazole-trimethoprim Hives and Shortness Of Breath 10/05/2017   Farxiga [dapagliflozin]  10/16/2017   Levothyroxine  sodium  10/05/2017   Lipitor [atorvastatin]  06/01/2022   Metformin  hcl  10/05/2017    Past Medical History:  Diagnosis Date  Arthritis    knees   Asthma    Iron deficiency anemia    Morbid obesity (HCC)    Rhabdomyolysis 04/2016   normal compartment pressures   Sleep apnea    uses cpap, setting is unknown, does not wear it consistently   Thyroid  disease    Type 2 diabetes mellitus (HCC) 06/02/2023    Past Surgical History:  Procedure Laterality Date   BIOPSY  01/10/2023   Procedure: BIOPSY;  Surgeon: Elicia Claw, MD;  Location: [facility];  Service: Gastroenterology;;   CESAREAN SECTION     COLONOSCOPY WITH PROPOFOL  N/A 07/21/2015   Procedure: COLONOSCOPY WITH PROPOFOL ;  Surgeon: Renaye Sous, MD;  Location: [facility];  Service: Endoscopy;  Laterality: N/A;   COLONOSCOPY WITH PROPOFOL  N/A 01/10/2023   Procedure: COLONOSCOPY WITH PROPOFOL ;  Surgeon: Elicia Claw, MD;  Location: [facility];  Service: Gastroenterology;  Laterality: N/A;   DILATION AND CURETTAGE OF UTERUS     ENDOMETRIAL ABLATION     ESOPHAGOGASTRODUODENOSCOPY (EGD) WITH PROPOFOL  N/A 01/10/2023   Procedure: ESOPHAGOGASTRODUODENOSCOPY (EGD) WITH PROPOFOL ;  Surgeon: Elicia Claw, MD;  Location: [facility];  Service: Gastroenterology;  Laterality: N/A;   GANGLION CYST EXCISION     rt arm   POLYPECTOMY  01/10/2023   Procedure: POLYPECTOMY;  Surgeon: Elicia Claw, MD;  [facility]: [facility];  Service: Gastroenterology;;   SHOULDER SURGERY Left    Rotator cuff repair    Family History  Problem Relation Age of Onset   Heart disease Mother    Heart failure Mother        Around 77   Hypertension Brother    Hypertension Maternal Grandmother    Heart disease Maternal Grandmother    Breast cancer Maternal Aunt     Social History   Socioeconomic History   Marital status: Single    Spouse name: Not on file   Number of children: Not on file   Years of education: Not on file   Highest education level: Not on file  Occupational History   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  Tobacco Use   Smoking status: Never   Smokeless tobacco: Never  Vaping Use   Vaping status: Never Used  Substance and Sexual Activity   Alcohol use: No   Drug use: No   Sexual activity: Not Currently    Birth control/protection: None  Other Topics Concern   Not on file  Social History Narrative   Not on file   Social Drivers of Health   Financial Resource Strain: Not on File (06/06/2022)   Received from [employer]    Financial Resource Strain: 0  Food Insecurity: Not at Risk (06/09/2023)   Received from [facility] Insecurity    Within the past 12 months, you worried  that your food would run out before you got money to buy more.: 1  Transportation Needs: Not at Risk (06/09/2023)   Received from [facility] Needs    In the past 12 months, has lack of transportation kept you from medical appointments, meetings, work or from getting things needed for daily living?: 1  Physical Activity: Not on File (06/06/2022)   Received from [facility]   Physical Activity    Physical Activity: 0  Recent Concern: Physical Activity - At Risk (06/06/2022)   Received from [facility]   Physical Activity    Physical Activity: 2  Stress: Not on File (06/06/2022)   Received from [facility]  Stress    Stress: 0  Social Connections: Not on File (03/28/2023)   Received from [facility]   Social Connections    Connectedness: 0  Intimate Partner Violence: Not on file    Review of Systems  Respiratory:  Positive for apnea.   Psychiatric/Behavioral:  Positive for sleep disturbance.     Vitals:   02/01/24 0823 02/01/24 0825  BP:  115/73  Pulse: 61   SpO2: 93%      Physical Exam Constitutional:      Appearance: She is obese.  HENT:     Head: Normocephalic.     Nose: Nose normal.     Mouth/Throat:     Mouth: Mucous membranes are moist.     Comments: Mallampati 3, crowded oropharynx Eyes:     General: No scleral icterus. Cardiovascular:     Rate and Rhythm: Normal rate and regular rhythm.     Heart sounds: No murmur heard.    No friction rub.  Pulmonary:     Effort: No respiratory distress.     Breath sounds: No stridor. No wheezing or rhonchi.  Musculoskeletal:     Cervical back: No rigidity or tenderness.  Neurological:     Mental Status: [name].  Psychiatric:        Mood and Affect: Mood normal.       02/01/2024    8:00 AM  Results of the Epworth flowsheet  Sitting and reading 2  Watching TV 1  Sitting, inactive in a public place (e.g. a theatre or a meeting) 0  As a passenger in a car for an hour without a break 0  Lying down to rest in the  afternoon when circumstances permit 1  Sitting and talking to someone 0  Sitting quietly after a lunch without alcohol 1  In a car, while stopped for a few minutes in traffic 0  Total score 5     Data Reviewed: Previous sleep study reviewed from 2014 - Moderate obstructive sleep apnea  Last office visit by Dr. Burnard in 2022 reviewed  Assessment:  Moderate obstructive sleep apnea  Class III obesity  History of heart failure, diastolic heart failure Hypertension Hyperlipidemia  Pathophysiology of sleep disordered breathing discussed with the patient Treatment options discussed with the patient  Plan/Recommendations:  Will contact medical supply company to obtain a download  May need pressure changes with excessive dryness and difficulty tolerating pressures, multiple awakenings which she feels is pressure related  Will send order for CPAP supplies  Encouraged to give us  a call with significant concerns   Jennet Epley MD [facility] 02/01/2024, 8:40 AM  CC: Buck Search, PA-C  Reviewed patient's download showing 100% compliance Current settings of 8-18 Residual AHI of 0.2 with 95 percentile pressure of 10.5  Will make pressure changes Change from 8-18 to 8-14

## 2024-03-14 ENCOUNTER — Other Ambulatory Visit: Payer: Self-pay | Admitting: Cardiology

## 2024-04-02 ENCOUNTER — Other Ambulatory Visit: Payer: Self-pay | Admitting: Physician Assistant

## 2024-04-02 DIAGNOSIS — Z1231 Encounter for screening mammogram for malignant neoplasm of breast: Secondary | ICD-10-CM

## 2024-04-13 ENCOUNTER — Other Ambulatory Visit: Payer: Self-pay | Admitting: Physician Assistant

## 2024-04-22 ENCOUNTER — Other Ambulatory Visit: Payer: Self-pay | Admitting: Physician Assistant

## 2024-04-23 ENCOUNTER — Ambulatory Visit
Admission: RE | Admit: 2024-04-23 | Discharge: 2024-04-23 | Disposition: A | Source: Ambulatory Visit | Attending: Physician Assistant | Admitting: Physician Assistant

## 2024-04-23 DIAGNOSIS — Z1231 Encounter for screening mammogram for malignant neoplasm of breast: Secondary | ICD-10-CM

## 2024-06-30 ENCOUNTER — Encounter: Payer: Self-pay | Admitting: Emergency Medicine

## 2024-06-30 ENCOUNTER — Ambulatory Visit
Admission: EM | Admit: 2024-06-30 | Discharge: 2024-06-30 | Disposition: A | Discharge status: Home / Self Care | Attending: Nurse Practitioner | Admitting: Nurse Practitioner

## 2024-06-30 DIAGNOSIS — M5442 Lumbago with sciatica, left side: Secondary | ICD-10-CM

## 2024-06-30 DIAGNOSIS — M5441 Lumbago with sciatica, right side: Secondary | ICD-10-CM | POA: Diagnosis not present

## 2024-06-30 LAB — POCT URINE DIPSTICK
Bilirubin, UA: NEGATIVE
Blood, UA: NEGATIVE
Glucose, UA: NEGATIVE mg/dL
Leukocytes, UA: NEGATIVE
Nitrite, UA: NEGATIVE
Spec Grav, UA: 1.025 (ref 1.010–1.025)
Urobilinogen, UA: 0.2 U/dL
pH, UA: 6 (ref 5.0–8.0)

## 2024-06-30 MED ORDER — LIDOCAINE 5 % EX PTCH: 1.0000 | MEDICATED_PATCH | CUTANEOUS | 0 refills | Status: AC

## 2024-06-30 MED ORDER — TIZANIDINE HCL 4 MG PO TABS: 4.0000 mg | ORAL_TABLET | Freq: Every day | ORAL | 0 refills | Status: AC

## 2024-06-30 MED ORDER — PREDNISONE 20 MG PO TABS: 40.0000 mg | ORAL_TABLET | Freq: Every day | ORAL | 0 refills | Status: AC

## 2024-06-30 NOTE — ED Provider Notes (Signed)
 [facility]    CSN: [id number] Arrival date & time: 06/30/24  1510      History   Chief Complaint Chief Complaint  Patient presents with   Back Pain   Abdominal Pain    HPI Theresa Kirby is a 68 y.o. female.   Discussed the use of AI scribe software for clinical note transcription with the patient, who gave verbal consent to proceed.   Patient presents with lower back pain that has been worsening over the past couple of weeks. The pain is located in the center of the lower back and radiates down into the buttocks, more prominently on the right side. The patient reports that the pain is severe enough to interfere with sleep. Recently, the patient has also developed gastrointestinal symptoms including stomach upset and loose bowel movements, though denies nausea or vomiting.  The patient has tried various home remedies including teas, brown root, lemon, honey, and turmeric, as well as Tylenol  for pain relief. The patient has a history of congestive heart failure and reports being advised not to take naproxen . Additional medical history includes gout, for which the patient takes colchicine  PRN, and diabetes with a recent A1c of 6.9%.   The patient denies any recent falls or injuries that could have precipitated the back pain. There is no associated numbness, tingling, or weakness in the legs. Urinary symptoms are absent, with no pain or burning during urination, no blood in urine, and no changes in urinary frequency.        Past Medical History:  Diagnosis Date   Arthritis    knees   Asthma    Iron deficiency anemia    Morbid obesity (HCC)    Rhabdomyolysis 04/2016   normal compartment pressures   Sleep apnea    uses cpap, setting is unknown, does not wear it consistently   Thyroid  disease    Type 2 diabetes mellitus (HCC) 06/02/2023    Patient Active Problem List   Diagnosis Date Noted   Diastolic heart failure (HCC) 06/02/2023   Hyperlipidemia  06/02/2023   Type 2 diabetes mellitus (HCC) 06/02/2023   Asthma 06/02/2023   Sleep apnea with use of continuous positive airway pressure (CPAP) 06/02/2023   Body mass index (BMI) of 50.0 to 59.9 in adult [facility]) 04/05/2021   Gout 01/20/2020   Acute idiopathic gout of right hand 01/20/2020   Incidental pulmonary nodule 06/08/2017   Mediastinal lymphadenopathy 06/08/2017   Euthyroid sick syndrome    Elevated blood pressure reading without diagnosis of hypertension 03/14/2017   CHF exacerbation (HCC) 03/13/2017   Hypothyroidism 03/13/2017   OSA (obstructive sleep apnea) 03/13/2017   Anxiety 02/08/2012   Morbid obesity (HCC) 02/08/2012    Past Surgical History:  Procedure Laterality Date   BIOPSY  01/10/2023   Procedure: BIOPSY;  Surgeon: Elicia Claw, MD;  [facility]: [facility];  Service: Gastroenterology;;   CESAREAN SECTION     COLONOSCOPY WITH PROPOFOL  N/A 07/21/2015   Procedure: COLONOSCOPY WITH PROPOFOL ;  Surgeon: Renaye Sous, MD;  Location: [facility];  Service: Endoscopy;  Laterality: N/A;   COLONOSCOPY WITH PROPOFOL  N/A 01/10/2023   Procedure: COLONOSCOPY WITH PROPOFOL ;  Surgeon: Elicia Claw, MD;  Location: [facility];  Service: Gastroenterology;  Laterality: N/A;   DILATION AND CURETTAGE OF UTERUS     ENDOMETRIAL ABLATION     ESOPHAGOGASTRODUODENOSCOPY (EGD) WITH PROPOFOL  N/A 01/10/2023   Procedure: ESOPHAGOGASTRODUODENOSCOPY (EGD) WITH PROPOFOL ;  Surgeon: Elicia Claw, MD;  Location: [facility];  Service: Gastroenterology;  Laterality: N/A;  GANGLION CYST EXCISION     rt arm   POLYPECTOMY  01/10/2023   Procedure: POLYPECTOMY;  Surgeon: Elicia Claw, MD;  [facility]: [facility];  Service: Gastroenterology;;   SHOULDER SURGERY Left    Rotator cuff repair    OB History   No obstetric history on file.      Home Medications    Prior to Admission medications   Medication Sig Start Date End Date Taking? Authorizing Provider  lidocaine  (LIDODERM )  5 % Place 1 patch onto the skin daily. Remove & Discard patch within 12 hours or as directed by MD 06/30/24  Yes Iola Lukes, FNP  predniSONE  (DELTASONE ) 20 MG tablet Take 2 tablets (40 mg total) by mouth daily for 5 days. 06/30/24 07/05/24 Yes Iola Lukes, FNP  tiZANidine  (ZANAFLEX ) 4 MG tablet Take 1 tablet (4 mg total) by mouth at bedtime. 06/30/24  Yes Kaidon Kinker, FNP  acetaminophen  (TYLENOL ) 325 MG tablet Take 650 mg by mouth every 6 (six) hours as needed (pain).     [provider]  albuterol  (VENTOLIN  HFA) 108 (90 Base) MCG/ACT inhaler Inhale 2 puffs into the lungs every 6 (six) hours as needed for wheezing or shortness of breath (rescue).  06/20/14   [provider]  aspirin  EC 81 MG EC tablet Take 1 tablet (81 mg total) by mouth daily. 03/20/17   Sebastian Toribio GAILS, MD  colchicine  0.6 MG tablet Take 0.6 mg by mouth as needed (for gout attack).    [provider]  ferrous sulfate  325 (65 FE) MG tablet Take 325 mg by mouth 3 (three) times daily with meals.    [provider]  fluticasone  (FLONASE) 50 MCG/ACT nasal spray Place 1 spray into both nostrils as needed for allergies. 06/07/22 02/01/24  [provider]  fluticasone -salmeterol (ADVAIR ) 500-50 MCG/ACT AEPB Inhale 1 puff into the lungs 2 (two) times daily.    [provider]  furosemide  (LASIX ) 40 MG tablet TAKE 1 TABLET BY MOUTH ONCE DAILY AND MAY TAKE  1 ADDITIONAL TABLET AS NEEDED FOR SHORTNESS OF BREATH OR LOWER EXTREMITY EDEMA 11/20/23   Lucien Orren SAILOR, PA-C  levothyroxine  (SYNTHROID ) 50 MCG tablet Take 50 mcg by mouth daily. 02/26/19   [provider]  montelukast  (SINGULAIR ) 10 MG tablet Take 10 mg by mouth at bedtime as needed (allergies.).     [provider]  Multiple Vitamin (MULTIVITAMIN) capsule Take 1 capsule by mouth daily.    [provider]  Omega-3 Fatty Acids (FISH OIL PO) Take 1 capsule by mouth daily.    [provider]   OZEMPIC, 0.25 OR 0.5 MG/DOSE, 2 MG/3ML SOPN Inject 0.5 mg into the skin once a week.    [provider]  potassium chloride  SA (KLOR-CON  M) 20 MEQ tablet Take 1 tablet by mouth twice daily 03/14/24   Jeffrie Oneil BROCKS, MD  rosuvastatin  (CRESTOR ) 5 MG tablet Take 1 tablet (5 mg total) by mouth daily. Patient not taking: Reported on 06/30/2024 08/07/23   Jeffrie Oneil BROCKS, MD  triamcinolone lotion (KENALOG) 0.1 % Apply 1 Application topically daily.    [provider]    Family History Family History  Problem Relation Age of Onset   Heart disease Mother    Heart failure Mother        Around 65   Hypertension Brother    Hypertension Maternal Grandmother    Heart disease Maternal Grandmother    Breast cancer Maternal Aunt     Social History Social  History   Tobacco Use   Smoking status: Never   Smokeless tobacco: Never  Vaping Use   Vaping status: Never Used  Substance Use Topics   Alcohol use: No   Drug use: No     Allergies   Sulfa antibiotics, Sulfamethoxazole-trimethoprim, Farxiga [dapagliflozin], Levothyroxine  sodium, Lipitor [atorvastatin], and Metformin  hcl   Review of Systems Review of Systems  Constitutional:  Negative for appetite change and fever.  Gastrointestinal:  Positive for abdominal pain (across lower abdomen). Negative for blood in stool, constipation, diarrhea, nausea and vomiting.       Bowels are more loose but not watery. No bowel incontinence    Genitourinary:  Positive for frequency. Negative for dysuria and hematuria.       No bowel incontinence   Musculoskeletal:  Positive for back pain. Negative for gait problem.  Neurological:  Negative for weakness and numbness.  All other systems reviewed and are negative.    Physical Exam Triage Vital Signs ED Triage Vitals  Encounter Vitals Group     BP 06/30/24 1541 (!) 160/77     Girls Systolic BP Percentile --      Girls Diastolic BP Percentile --      Boys Systolic BP Percentile --       Boys Diastolic BP Percentile --      Pulse Rate 06/30/24 1541 74     Resp --      Temp 06/30/24 1541 (!) 97.3 F (36.3 C)     Temp Source 06/30/24 1541 Oral     SpO2 06/30/24 1541 93 %     Weight --      Height --      Head Circumference --      Peak Flow --      Pain Score 06/30/24 1553 6     Pain Loc --      Pain Education --      Exclude from Growth Chart --    No data found.  Updated Vital Signs BP (!) 160/77 (BP Location: Right Arm)   Pulse 74   Temp (!) 97.3 F (36.3 C) (Oral)   LMP 05/12/2012   SpO2 93%   Visual Acuity Right Eye Distance:   Left Eye Distance:   Bilateral Distance:    Right Eye Near:   Left Eye Near:    Bilateral Near:     Physical Exam Vitals reviewed.  Constitutional:      General: She is awake. She is not in acute distress.    Appearance: Normal appearance. She is well-developed. She is obese. She is not ill-appearing, toxic-appearing or diaphoretic.  HENT:     Head: Normocephalic.     Mouth/Throat:     Mouth: Mucous membranes are moist.  Cardiovascular:     Rate and Rhythm: Normal rate and regular rhythm.  Pulmonary:     Effort: Pulmonary effort is normal.     Breath sounds: Normal breath sounds.  Abdominal:     General: Bowel sounds are normal. There is no distension.     Palpations: Abdomen is soft.     Tenderness: There is no abdominal tenderness. There is no right CVA tenderness or left CVA tenderness.  Musculoskeletal:        General: Normal range of motion.     Cervical back: Normal, normal range of motion and neck supple.     Thoracic back: Normal.     Lumbar back: Tenderness present. No swelling, deformity, lacerations or spasms. Normal range of  motion. Negative right straight leg raise test and negative left straight leg raise test.       Back:  Skin:    General: Skin is warm and dry.  Neurological:     General: No focal deficit present.     Mental Status: [name] and oriented to person, place, and time.      Cranial Nerves: Cranial nerves 2-12 are intact.     Sensory: Sensation is intact.     Motor: Motor function is intact. No weakness.     Coordination: Coordination is intact.     Gait: Gait is intact.  Psychiatric:        Mood and Affect: Mood normal.        Speech: Speech normal.        Behavior: Behavior normal. Behavior is cooperative.      UC Treatments / Results  Labs (all labs ordered are listed, but only abnormal results are displayed) Labs Reviewed  POCT URINE DIPSTICK - Abnormal; Notable for the following components:      Result Value   Ketones, POC UA trace (5) (*)    All other components within normal limits    EKG   Radiology No results found.  Procedures Procedures (including critical care time)  Medications Ordered in UC Medications - No data to display  Initial Impression / Assessment and Plan / UC Course  I have reviewed the triage vital signs and the nursing notes.  Pertinent labs & imaging results that were available during my care of the patient were reviewed by me and considered in my medical decision making (see chart for details).    The patient presents with low back pain radiating in a pattern consistent with sciatica. Physical examination reveals no focal neurological deficits or signs of acute neurologic compromise. Treatment was initiated with prescriptions for prednisone , Zanaflex , and lidocaine  patches for pain and muscle spasm management. Due to the patient's history of CHF, he was advised to avoid additional over-the-counter NSAIDs; Tylenol  may be used as needed for supplemental pain control. Supportive measures including activity modification, gentle stretching, and use of heat or ice were reviewed. The patient was advised to follow up with his primary care provider and/or orthopedics if symptoms persist, worsen, or fail to improve. He was instructed to seek emergency care for severe or progressive pain, new or worsening weakness, numbness,  loss of bowel or bladder control, saddle anesthesia, or any other concerning symptoms suggestive of neurologic compromise.  Today's evaluation has revealed no signs of a dangerous process. Discussed diagnosis with patient and/or guardian. Patient and/or guardian aware of their diagnosis, possible red flag symptoms to watch out for and need for close follow up. Patient and/or guardian understands verbal and written discharge instructions. Patient and/or guardian comfortable with plan and disposition.  Patient and/or guardian has a clear mental status at this time, good insight into illness (after discussion and teaching) and has clear judgment to make decisions regarding their care  Documentation was completed with the aid of voice recognition software. Transcription may contain typographical errors.   Final Clinical Impressions(s) / UC Diagnoses   Final diagnoses:  Acute midline low back pain with bilateral sciatica     Discharge Instructions      You were seen today for low back pain with symptoms consistent with sciatica, which is irritation of the nerve that runs from the lower back down the leg. Your exam did not show any signs of nerve damage or serious complications.  Take the prescribed prednisone , Zanaflex , and use the lidocaine  patches exactly as directed to help reduce inflammation, muscle spasm, and pain. Because of your history of heart failure, avoid all over-the-counter anti-inflammatory medications such as ibuprofen , naproxen , or aspirin  unless specifically told otherwise by your provider. You may use Tylenol  as needed for additional pain relief. For symptom management at home, limit activities that worsen pain but keep moving with light activity rather than strict bed rest. Gentle stretching, short walks, and changing positions frequently can help. Apply heat or ice to the lower back for 15-20 minutes several times daily based on what feels best. Maintain good posture when sitting or  sleeping, and avoid heavy lifting or twisting while symptoms are active. Follow up with your primary care provider or an orthopedic specialist if pain does not improve over the next few days, continues to interfere with daily activities, or keeps coming back. Seek emergency care right away if you develop worsening or severe pain, new weakness or numbness in the legs, loss of bowel or bladder control, numbness in the groin or saddle area, trouble walking, or any sudden change in symptoms.      ED Prescriptions     Medication Sig Dispense Auth. Provider   predniSONE  (DELTASONE ) 20 MG tablet Take 2 tablets (40 mg total) by mouth daily for 5 days. 10 tablet Iola Lukes, FNP   tiZANidine  (ZANAFLEX ) 4 MG tablet Take 1 tablet (4 mg total) by mouth at bedtime. 5 tablet Iola Lukes, FNP   lidocaine  (LIDODERM ) 5 % Place 1 patch onto the skin daily. Remove & Discard patch within 12 hours or as directed by MD 14 patch Iola Lukes, FNP      PDMP not reviewed this encounter.   Iola Lukes, [place] 06/30/24 [phone]

## 2024-06-30 NOTE — Discharge Instructions (Addendum)
 You were seen today for low back pain with symptoms consistent with sciatica, which is irritation of the nerve that runs from the lower back down the leg. Your exam did not show any signs of nerve damage or serious complications. Take the prescribed prednisone , Zanaflex , and use the lidocaine  patches exactly as directed to help reduce inflammation, muscle spasm, and pain. Because of your history of heart failure, avoid all over-the-counter anti-inflammatory medications such as ibuprofen , naproxen , or aspirin  unless specifically told otherwise by your provider. You may use Tylenol  as needed for additional pain relief. For symptom management at home, limit activities that worsen pain but keep moving with light activity rather than strict bed rest. Gentle stretching, short walks, and changing positions frequently can help. Apply heat or ice to the lower back for 15-20 minutes several times daily based on what feels best. Maintain good posture when sitting or sleeping, and avoid heavy lifting or twisting while symptoms are active. Follow up with your primary care provider or an orthopedic specialist if pain does not improve over the next few days, continues to interfere with daily activities, or keeps coming back. Seek emergency care right away if you develop worsening or severe pain, new weakness or numbness in the legs, loss of bowel or bladder control, numbness in the groin or saddle area, trouble walking, or any sudden change in symptoms.

## 2024-06-30 NOTE — ED Triage Notes (Signed)
 Pt c/o lower middle back that began a few weeks ago and then pain moved down legs. She began having stomach pain a few days ago. States she hasn't had diarrhea but stool have been a little more lose.
